# Patient Record
Sex: Male | Born: 1955 | Race: Black or African American | Hispanic: No | Marital: Married | Smoking: Former smoker
Health system: Southern US, Community
[De-identification: ages and names within clinical notes are randomized; demographics above are authoritative.]

## PROBLEM LIST (undated history)

## (undated) DIAGNOSIS — E7800 Pure hypercholesterolemia, unspecified: Secondary | ICD-10-CM

## (undated) DIAGNOSIS — B029 Zoster without complications: Secondary | ICD-10-CM

## (undated) DIAGNOSIS — Z95828 Presence of other vascular implants and grafts: Secondary | ICD-10-CM

## (undated) DIAGNOSIS — I779 Disorder of arteries and arterioles, unspecified: Secondary | ICD-10-CM

## (undated) DIAGNOSIS — R9431 Abnormal electrocardiogram [ECG] [EKG]: Secondary | ICD-10-CM

## (undated) DIAGNOSIS — E119 Type 2 diabetes mellitus without complications: Secondary | ICD-10-CM

## (undated) DIAGNOSIS — K219 Gastro-esophageal reflux disease without esophagitis: Secondary | ICD-10-CM

## (undated) DIAGNOSIS — M1990 Unspecified osteoarthritis, unspecified site: Secondary | ICD-10-CM

## (undated) DIAGNOSIS — I739 Peripheral vascular disease, unspecified: Secondary | ICD-10-CM

## (undated) DIAGNOSIS — R739 Hyperglycemia, unspecified: Secondary | ICD-10-CM

## (undated) DIAGNOSIS — I10 Essential (primary) hypertension: Secondary | ICD-10-CM

## (undated) HISTORY — DX: Abnormal electrocardiogram (ECG) (EKG): R94.31

---

## 2002-04-13 HISTORY — PX: INGUINAL HERNIA REPAIR: SUR1180

## 2002-10-19 ENCOUNTER — Ambulatory Visit (HOSPITAL_BASED_OUTPATIENT_CLINIC_OR_DEPARTMENT_OTHER): Admission: RE | Admit: 2002-10-19 | Discharge: 2002-10-19 | Payer: Self-pay | Admitting: General Surgery

## 2009-12-13 ENCOUNTER — Emergency Department (HOSPITAL_COMMUNITY): Admission: EM | Admit: 2009-12-13 | Discharge: 2009-12-14 | Payer: Self-pay | Admitting: Emergency Medicine

## 2009-12-17 ENCOUNTER — Emergency Department (HOSPITAL_COMMUNITY): Admission: EM | Admit: 2009-12-17 | Discharge: 2009-12-18 | Payer: Self-pay | Admitting: Emergency Medicine

## 2010-06-26 LAB — POCT CARDIAC MARKERS
Myoglobin, poc: 50.1 ng/mL (ref 12–200)
Myoglobin, poc: 66.8 ng/mL (ref 12–200)
Troponin i, poc: 0.08 ng/mL (ref 0.00–0.09)
Troponin i, poc: <0.05 ng/mL (ref 0.00–0.09)

## 2010-06-26 LAB — DIFFERENTIAL
Basophils Relative: 0 % (ref 0–1)
Basophils Relative: 0 % (ref 0–1)
Eosinophils Absolute: 0.1 10*3/uL (ref 0.0–0.7)
Eosinophils Absolute: 0.1 10*3/uL (ref 0.0–0.7)
Lymphs Abs: 2.3 10*3/uL (ref 0.7–4.0)
Monocytes Absolute: 0.4 10*3/uL (ref 0.1–1.0)
Monocytes Relative: 7 % (ref 3–12)
Neutro Abs: 2.5 10*3/uL (ref 1.7–7.7)
Neutrophils Relative %: 29 % — ABNORMAL LOW (ref 43–77)

## 2010-06-26 LAB — COMPREHENSIVE METABOLIC PANEL
ALT: 20 U/L (ref 0–53)
ALT: 23 U/L (ref 0–53)
Albumin: 4.1 g/dL (ref 3.5–5.2)
Alkaline Phosphatase: 55 U/L (ref 39–117)
Alkaline Phosphatase: 67 U/L (ref 39–117)
BUN: 10 mg/dL (ref 6–23)
CO2: 29 mEq/L (ref 19–32)
Calcium: 9.2 mg/dL (ref 8.4–10.5)
GFR calc Af Amer: >60 mL/min (ref >60)
GFR calc non Af Amer: >60 mL/min (ref >60)
Glucose, Bld: 121 mg/dL — ABNORMAL HIGH (ref 70–99)
Potassium: 3.5 mEq/L (ref 3.5–5.1)
Sodium: 133 mEq/L — ABNORMAL LOW (ref 135–145)
Sodium: 137 mEq/L (ref 135–145)
Total Protein: 7.2 g/dL (ref 6.0–8.3)

## 2010-06-26 LAB — CBC
HCT: 37.2 % — ABNORMAL LOW (ref 39.0–52.0)
Hemoglobin: 12.9 g/dL — ABNORMAL LOW (ref 13.0–17.0)
MCH: 30.9 pg (ref 26.0–34.0)
MCHC: 34.7 g/dL (ref 30.0–36.0)
MCV: 89.2 fL (ref 78.0–100.0)
Platelets: 173 10*3/uL (ref 150–400)
RDW: 11.8 % (ref 11.5–15.5)
WBC: 5.1 10*3/uL (ref 4.0–10.5)

## 2010-06-26 LAB — LIPASE, BLOOD: Lipase: 42 U/L (ref 11–59)

## 2010-06-26 LAB — PROTIME-INR: Prothrombin Time: 13.4 seconds (ref 11.6–15.2)

## 2010-08-29 NOTE — Op Note (Signed)
NAME:  RAYEN, DAFOE                          ACCOUNT NO.:  [id number]   MEDICAL RECORD NO.:  [id number]                   PATIENT TYPE:  AMB   LOCATION:  DSC                                  FACILITY:  [facility]   PHYSICIAN:  Ollen Gross. Vernell Morgans, M.D.              DATE OF BIRTH:  11/03/1955   DATE OF PROCEDURE:  10/19/2002  DATE OF DISCHARGE:  10/19/2002                                 OPERATIVE REPORT   PREOPERATIVE DIAGNOSES:  Right inguinal hernia.   POSTOPERATIVE DIAGNOSES:  Right direct inguinal hernia.   OPERATION PERFORMED:  Right direct inguinal hernia repair with mesh.   SURGEON:  Ollen Gross. Carolynne Edouard, M.D.   ANESTHESIA:  General endotracheal.   DESCRIPTION OF PROCEDURE:  After informed consent was obtained, the patient  was brought to the operating room and placed in supine position on the  operating table.  After adequate induction of general anesthesia, the  patient's right groin and abdomen were prepped with Betadine and draped in  the usual sterile manner.  Next, a small incision was made from the edge of  the pubic tubercle on the right towards the anterior iliac spine for a  distance of about 5 cm.  This incision was carried down through the skin and  subcutaneous tissue sharply with the electrocautery.  A small bridging vein  was encountered.  It was clamped with hemostats, divided and ligated with 3-  0 silk ties.  Dissection was then carried down through the subcutaneous  tissue until the fascia of the external oblique was encountered.  The  external oblique was opened along its fibers sharply with a 15 blade knife  and Metzenbaum scissors to the apex of the external ring.  blunt dissection  was then carried out beneath this fascial layer.  The cord structures were  identified and were surrounded bluntly between two fingers.  Next, a half  inch Penrose drain was placed around the cord structures for retraction.  The cord structures were carefully and bluntly skeletonized.   There was no  hernia sac identified with the cord structures.  There was an obvious  weakness of the floor of the inguinal canal consistent with a direct hernia.  The floor was repaired using interrupted  2-0 Vicryl stitches.  Next, a 3 x  6 piece of Atrium mesh was cut to fit and laterally, the mesh was cut  longitudinally, so that the tails could be wrapped around the cord.  The  mesh was sewed inferiorly to the shelving edge of the inguinal ligament  using a running 2-0 Prolene stitch.  The mesh was sewed superiorly to the  musculoaponeurotic strength layer of the transversalis using interrupted 2-0  Prolene vertical mattress stitches.  The tails were wrapped around the cord  and anchored laterally to the shelving edge of the inguinal ligament with  interrupted 2-0 Prolene stitch.  The repair was in good  position with no  tension.  The wound was then irrigated with saline.  The external oblique  was reapproximated with a 3-0 Vicryl running stitch.  The subcutaneous  tissue was infiltrated with 0.25% Marcaine with epinephrine.  The  subcutaneous fascia was closed with interrupted 3-0 Vicryl stitches and the  skin incision was closed with a 4-0 Monocryl running  subcuticular stitch.  Benzoin and Steri-Strips were applied.  The patient  tolerated the procedure well.  At the end of the case all sponge, needle and  instrument counts were correct.  The patient was awakened and taken to the  recovery room in stable condition.                                                  Ollen Gross. Vernell Morgans, M.D.    PST/MEDQ  D:  10/30/2002  T:  10/30/2002  Job:  [id number]

## 2013-11-25 ENCOUNTER — Ambulatory Visit (INDEPENDENT_AMBULATORY_CARE_PROVIDER_SITE_OTHER): Payer: BC Managed Care – PPO | Admitting: Emergency Medicine

## 2013-11-25 VITALS — BP 166/92 | HR 88 | Temp 98.2°F | Resp 16 | Ht 71.0 in | Wt 185.2 lb

## 2013-11-25 DIAGNOSIS — L02511 Cutaneous abscess of right hand: Secondary | ICD-10-CM

## 2013-11-25 DIAGNOSIS — IMO0002 Reserved for concepts with insufficient information to code with codable children: Secondary | ICD-10-CM

## 2013-11-25 DIAGNOSIS — M722 Plantar fascial fibromatosis: Secondary | ICD-10-CM

## 2013-11-25 DIAGNOSIS — B353 Tinea pedis: Secondary | ICD-10-CM

## 2013-11-25 MED ORDER — NAPROXEN SODIUM 550 MG PO TABS: 550.0000 mg | ORAL_TABLET | Freq: Two times a day (BID) | ORAL | Status: DC

## 2013-11-25 MED ORDER — SULFAMETHOXAZOLE-TMP DS 800-160 MG PO TABS: 1.0000 | ORAL_TABLET | Freq: Two times a day (BID) | ORAL | Status: DC

## 2013-11-25 NOTE — Patient Instructions (Signed)
Plantar Fasciitis  Plantar fasciitis is a common condition that causes foot pain. It is soreness (inflammation) of the band of tough fibrous tissue on the bottom of the foot that runs from the heel bone (calcaneus) to the ball of the foot. The cause of this soreness may be from excessive standing, poor fitting shoes, running on hard surfaces, being overweight, having an abnormal walk, or overuse (this is common in runners) of the painful foot or feet. It is also common in aerobic exercise dancers and ballet dancers.  SYMPTOMS   Most people with plantar fasciitis complain of:   Severe pain in the morning on the bottom of their foot especially when taking the first steps out of bed. This pain recedes after a few minutes of walking.   Severe pain is experienced also during walking following a long period of inactivity.   Pain is worse when walking barefoot or up stairs  DIAGNOSIS    Your caregiver will diagnose this condition by examining and feeling your foot.   Special tests such as X-rays of your foot, are usually not needed.  PREVENTION    Consult a sports medicine professional before beginning a new exercise program.   Walking programs offer a good workout. With walking there is a lower chance of overuse injuries common to runners. There is less impact and less jarring of the joints.   Begin all new exercise programs slowly. If problems or pain develop, decrease the amount of time or distance until you are at a comfortable level.   Wear good shoes and replace them regularly.   Stretch your foot and the heel cords at the back of the ankle (Achilles tendon) both before and after exercise.   Run or exercise on even surfaces that are not hard. For example, asphalt is better than pavement.   Do not run barefoot on hard surfaces.   If using a treadmill, vary the incline.   Do not continue to workout if you have foot or joint problems. Seek professional help if they do not improve.  HOME CARE INSTRUCTIONS     Avoid activities that cause you pain until you recover.   Use ice or cold packs on the problem or painful areas after working out.   Only take over-the-counter or prescription medicines for pain, discomfort, or fever as directed by your caregiver.   Soft shoe inserts or athletic shoes with air or gel sole cushions may be helpful.   If problems continue or become more severe, consult a sports medicine caregiver or your own health care provider. Cortisone is a potent anti-inflammatory medication that may be injected into the painful area. You can discuss this treatment with your caregiver.  MAKE SURE YOU:    Understand these instructions.   Will watch your condition.   Will get help right away if you are not doing well or get worse.  Document Released: 12/23/2000 Document Revised: 06/22/2011 Document Reviewed: 02/22/2008  ExitCare Patient Information 2015 [employer]. This information is not intended to replace advice given to you by your health care provider. Make sure you discuss any questions you have with your health care provider.

## 2013-11-25 NOTE — Progress Notes (Signed)
[facility] [address], [place] [id number] [phone]  Date:  11/25/2013   Name:  Anthony Brock   DOB:  08/20/1955   MRN:  [id number]  PCP:  No PCP Per Patient    Chief Complaint: Hand Pain and Callouses   History of Present Illness:  Anthony Brock is a 58 y.o. very pleasant male patient who presents with the following:  Has pain in right second finger.  Thinks he has a metal splinter in from work.  Painful and swelling.   No known FB Has pain in heels after standing in am.  Pain free at night. No history of in jury or overuse.  No swelling or bruising. Has athletes foot. No improvement with over the counter medications or other home remedies.  Denies other complaint or health concern today.   There are no active problems to display for this patient.   History reviewed. No pertinent past medical history.  Past Surgical History  Procedure Laterality Date  . Hernia repair      History  Substance Use Topics  . Smoking status: Current Every Day Smoker -- 10.00 packs/day    Types: Cigarettes  . Smokeless tobacco: Not on file  . Alcohol Use: No    Family History  Problem Relation Age of Onset  . Alzheimer's disease Mother   . Emphysema Father   . Alzheimer's disease Sister     No Known Allergies  Medication list has been reviewed and updated.  No current outpatient prescriptions on file prior to visit.   No current facility-administered medications on file prior to visit.    Review of Systems:  As per HPI, otherwise negative.    Physical Examination: Filed Vitals:   11/25/13 1423  BP: 166/92  Pulse: 88  Temp: 98.2 F (36.8 C)  Resp: 16   Filed Vitals:   11/25/13 1423  Height: 5\' 11"  (1.803 m)  Weight: 185 lb 3.2 oz (84.006 kg)   Body mass index is 25.84 kg/(m^2). Ideal Body Weight: Weight in (lb) to have BMI = 25: 178.9   GEN: WDWN, NAD, Non-toxic, Alert & Oriented x 3 HEENT: Atraumatic, Normocephalic.  Ears and  Nose: No external deformity. EXTR: No clubbing/cyanosis/edema NEURO: Normal gait.  PSYCH: Normally interactive. Conversant. Not depressed or anxious appearing.  Calm demeanor.  Tinea pedis both feet Tender anterior to heels.   Right hand second finger felon  Assessment and Plan: Felon Tinea pedis Plantar fasciitis Septra Anaprox  Signed,  Phillips OdorJeffery Kiylah Loyer, MD

## 2013-12-02 ENCOUNTER — Ambulatory Visit (INDEPENDENT_AMBULATORY_CARE_PROVIDER_SITE_OTHER): Payer: BC Managed Care – PPO

## 2013-12-02 ENCOUNTER — Ambulatory Visit (INDEPENDENT_AMBULATORY_CARE_PROVIDER_SITE_OTHER): Payer: BC Managed Care – PPO | Admitting: Family Medicine

## 2013-12-02 ENCOUNTER — Encounter (HOSPITAL_COMMUNITY): Payer: Self-pay | Admitting: Emergency Medicine

## 2013-12-02 ENCOUNTER — Emergency Department (HOSPITAL_COMMUNITY)
Admission: EM | Admit: 2013-12-02 | Discharge: 2013-12-02 | Disposition: A | Discharge status: Home / Self Care | Payer: BC Managed Care – PPO | Attending: Emergency Medicine | Admitting: Emergency Medicine

## 2013-12-02 VITALS — BP 138/86 | HR 50 | Temp 97.5°F | Resp 17 | Ht 71.0 in | Wt 184.0 lb

## 2013-12-02 DIAGNOSIS — F17200 Nicotine dependence, unspecified, uncomplicated: Secondary | ICD-10-CM | POA: Insufficient documentation

## 2013-12-02 DIAGNOSIS — IMO0002 Reserved for concepts with insufficient information to code with codable children: Secondary | ICD-10-CM

## 2013-12-02 DIAGNOSIS — N529 Male erectile dysfunction, unspecified: Secondary | ICD-10-CM

## 2013-12-02 DIAGNOSIS — L02511 Cutaneous abscess of right hand: Secondary | ICD-10-CM

## 2013-12-02 DIAGNOSIS — Z792 Long term (current) use of antibiotics: Secondary | ICD-10-CM | POA: Insufficient documentation

## 2013-12-02 DIAGNOSIS — M79609 Pain in unspecified limb: Secondary | ICD-10-CM | POA: Insufficient documentation

## 2013-12-02 DIAGNOSIS — Z791 Long term (current) use of non-steroidal anti-inflammatories (NSAID): Secondary | ICD-10-CM | POA: Diagnosis not present

## 2013-12-02 MED ORDER — HYDROCODONE-ACETAMINOPHEN 5-325 MG PO TABS: 1.0000 | ORAL_TABLET | ORAL | Status: DC | PRN

## 2013-12-02 MED ORDER — CEPHALEXIN 500 MG PO CAPS: 500.0000 mg | ORAL_CAPSULE | Freq: Four times a day (QID) | ORAL | Status: DC

## 2013-12-02 MED ORDER — SILDENAFIL CITRATE 100 MG PO TABS: 50.0000 mg | ORAL_TABLET | Freq: Every day | ORAL | Status: DC | PRN

## 2013-12-02 NOTE — ED Provider Notes (Signed)
CSN: [id number]     Arrival date & time 12/02/13  1235 History   First MD Initiated Contact with Patient 12/02/13 1310     Chief Complaint  Patient presents with  . Finger Injury     (Consider location/radiation/quality/duration/timing/severity/associated sxs/prior Treatment) HPI Comments: Patient with a history of Right index finger felon. [facility] Failed OP abx. Sent to ED for procedure by Dr. Izora Ribasoley. No rf such as diabetes or other immunocompromise. R hand dominant Denies fevers, chills, myalgias, arthralgias. Denies DOE, SOB, chest tightness or pressure, radiation to left arm, jaw or back, or diaphoresis. Denies dysuria, flank pain, suprapubic pain, frequency, urgency, or hematuria. Denies headaches, light headedness, weakness, visual disturbances. Denies abdominal pain, nausea, vomiting, diarrhea or constipation.     The history is provided by the patient and medical records. No language interpreter was used.    History reviewed. No pertinent past medical history. Past Surgical History  Procedure Laterality Date  . Hernia repair     Family History  Problem Relation Age of Onset  . Alzheimer's disease Mother   . Emphysema Father   . Alzheimer's disease Sister    History  Substance Use Topics  . Smoking status: Current Every Day Smoker -- 0.75 packs/day for 39 years    Types: Cigarettes  . Smokeless tobacco: Not on file  . Alcohol Use: No    Review of Systems  All other systems reviewed and are negative.     Allergies  Review of patient's allergies indicates no known allergies.  Home Medications   Prior to Admission medications   Medication Sig Start Date End Date Taking? Authorizing Provider  naproxen sodium (ANAPROX DS) 550 MG tablet Take 1 tablet (550 mg total) by mouth 2 (two) times daily with a meal. 11/25/13 11/25/14 Yes Phillips OdorJeffery Anderson, MD  OVER THE COUNTER MEDICATION Apply 1 application topically 2 (two) times daily. "OTC  Athletes foot cream"   Yes Historical Provider, MD  sulfamethoxazole-trimethoprim (BACTRIM DS) 800-160 MG per tablet Take 1 tablet by mouth 2 (two) times daily. 11/25/13  Yes Phillips OdorJeffery Anderson, MD   BP 168/79  Pulse 52  Resp 16  Ht 6' (1.829 m)  Wt 185 lb 8 oz (84.142 kg)  BMI 25.15 kg/m2  SpO2 98% Physical Exam  Nursing note and vitals reviewed. Constitutional: He is oriented to person, place, and time. He appears well-developed and well-nourished. No distress.  HENT:  Head: Normocephalic and atraumatic.  Eyes: Conjunctivae are normal. No scleral icterus.  Neck: Normal range of motion. Neck supple.  Cardiovascular: Normal rate, regular rhythm and normal heart sounds.   Pulmonary/Chest: Effort normal and breath sounds normal. No respiratory distress.  Abdominal: Soft. There is no tenderness.  Musculoskeletal: He exhibits no edema.  Neurological: He is alert and oriented to person, place, and time.  Skin: Skin is warm and dry. He is not diaphoretic.  Distal R index finger swollen with noted purulence in the finger pad.  Psychiatric: His behavior is normal.    ED Course  Procedures (including critical care time) Labs Review Labs Reviewed - No data to display  Imaging Review Dg Finger Index Right  12/02/2013   CLINICAL DATA:  Pain and swelling in the index finger.  EXAM: RIGHT INDEX FINGER 2+V  COMPARISON:  None.  FINDINGS: The mineralization and alignment are normal. There is no evidence of acute fracture or dislocation. Mild interphalangeal degenerative changes are present. There is mild soft tissue swelling distally.  There is no evidence of foreign body or bone destruction.  IMPRESSION: Mild soft tissue swelling and degenerative changes. No acute osseous findings or evidence of foreign body.   Electronically Signed   By: Roxy Horseman M.D.   On: 12/02/2013 12:47     EKG Interpretation None      MDM   Final diagnoses:  Felon, right    patietn seen in the ED by Dr. Izora Ribas  with in house I&D (please see his note) D/c with keflex and pain meds.  F/u with Dr Izora Ribas  The patient appears reasonably screened and/or stabilized for discharge and I doubt any other medical condition or other [facility] requiring further screening, evaluation, or treatment in the ED at this time prior to discharge.     Arthor Captain, PA-C 12/05/13 [phone]

## 2013-12-02 NOTE — ED Notes (Signed)
Pt here for finger injury, right pointer finger, sent [facility] to see dr Izora Ribasoley

## 2013-12-02 NOTE — Consult Note (Signed)
Reason for Consult:infected RIF Referring Physician: ER  CC:My finger is infected HPI:  Anthony Brock is an 58 y.o. right handed male who presents with   Felon of RIF, pt thought he got a piece of metal in finger a couple of days age, was seen at urgent care placed on abx, since then finger has become worse, with more swelling, pain.     .   Pain is rated at   7 /10 and is described as sharp/dull.  Pain is constant.  Pain is made better by rest/immobilization, worse with motion.   Associated signs/symptoms: pain of finger, decreased motion Previous treatment:  abx by outside urgent care   History reviewed. No pertinent past medical history.  Past Surgical History  Procedure Laterality Date  . Hernia repair      Family History  Problem Relation Age of Onset  . Alzheimer's disease Mother   . Emphysema Father   . Alzheimer's disease Sister     Social History:  reports that he has been smoking Cigarettes.  He has a 29.25 pack-year smoking history. He does not have any smokeless tobacco history on file. He reports that he does not drink alcohol or use illicit drugs.  Allergies: No Known Allergies  Medications: I have reviewed the patient's current medications.  No results found for this or any previous visit (from the past 48 hour(s)).  Dg Finger Index Right  12/02/2013   CLINICAL DATA:  Pain and swelling in the index finger.  EXAM: RIGHT INDEX FINGER 2+V  COMPARISON:  None.  FINDINGS: The mineralization and alignment are normal. There is no evidence of acute fracture or dislocation. Mild interphalangeal degenerative changes are present. There is mild soft tissue swelling distally. There is no evidence of foreign body or bone destruction.  IMPRESSION: Mild soft tissue swelling and degenerative changes. No acute osseous findings or evidence of foreign body.   Electronically Signed   By: Roxy HorsemanBill  Veazey M.D.   On: 12/02/2013 12:47    Pertinent items are noted in HPI. Temp:  [97.5 F (36.4  C)] 97.5 F (36.4 C) (08/22 1124) Pulse Rate:  [50-52] 52 (08/22 1301) Resp:  [16-17] 16 (08/22 1301) BP: (138-168)/(79-86) 168/79 mmHg (08/22 1301) SpO2:  [98 %-99 %] 98 % (08/22 1301) Weight:  [83.462 kg (184 lb)-84.142 kg (185 lb 8 oz)] 84.142 kg (185 lb 8 oz) (08/22 1301) General appearance: alert and cooperative Resp: clear to auscultation bilaterally Cardio: regular rate and rhythm GI: soft, non-tender; bowel sounds normal; no masses,  no organomegaly Extremities: extremities normal, atraumatic, no cyanosis or edema and except for RIF with evidence of fluctuance from base of nail to radial side of nail and pulp - fleon, no evidence of tenosynovitis   Assessment: Felon RIF Plan:  will i&d, pack with gauze, dressing changes explained to patient; pt currently on abx - encouraged to continue and finish, f/u as needed.  Pt should keep finger clean, covered . I have discussed this treatment plan in detail with patient, including the risks of the recommended treatment or surgery, the benefits and the alternatives.  The patient understands that additional treatment may be necessary.  Anthony Brock CHRISTOPHER 12/02/2013, 1:56 PM

## 2013-12-02 NOTE — Progress Notes (Signed)
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  It remains swollen and tender despite Septra antibiotic for one week.  He also has a metal foreign body at proximal phalanx of same finger  Objective:  NAD  Tense swelling right index finger distal phalanx.  Very tender with erythema radial side.  Foreign body right index finger proximal phalanx:  Removed with forceps and number 15 BP blade  [facility] reading (PRIMARY) by  Dr. Milus GlazierLauenstein:  No foreign body seen in right distal phalanx, STS only  Assessment:  Patient has what may be a paronychia this developing into a felon. I spoke with Dr. Izora Ribasoley on the phone and he will see the patient and emergency room warnings notified on arrival.  Patient also has some problems with the ED and a Viagra prescription was sent in for him.  Signed, Elvina SidleKurt Tynika Luddy.

## 2013-12-02 NOTE — Patient Instructions (Signed)
[facility] go [facility]. Dr. Izora [facility]asoley with [facility] is expecting you.

## 2013-12-02 NOTE — ED Notes (Signed)
Dr. Coley at bedside. 

## 2013-12-02 NOTE — ED Notes (Signed)
Harris, PA at bedside.  

## 2013-12-02 NOTE — ED Notes (Signed)
Suture cart at bedside 

## 2013-12-02 NOTE — Discharge Instructions (Signed)
Please follow the directions given to you by Dr. Izora Ribasoley Please take all of your antibiotics until finished!   You may develop abdominal discomfort or diarrhea from the antibiotic.  You may help offset this with probiotics which you can buy or get in yogurt. Do not eat  or take the probiotics until 2 hours after your antibiotic.   Do not drive, operate heavy machinery, drink alcohol, or take other tylenol containing products with norco medicine.  Fingertip Infection When an infection is around the nail, it is called a paronychia. When it appears over the tip of the finger, it is called a felon. These infections are due to minor injuries or cracks in the skin. If they are not treated properly, they can lead to bone infection and permanent damage to the fingernail. Incision and drainage is necessary if a pus pocket (an abscess) has formed. Antibiotics and pain medicine may also be needed. Keep your hand elevated for the next 2-3 days to reduce swelling and pain. If a pack was placed in the abscess, it should be removed in 1-2 days by your caregiver. Soak the finger in warm water for 20 minutes 4 times daily to help promote drainage. Keep the hands as dry as possible. Wear protective gloves with cotton liners. See your caregiver for follow-up care as recommended.  HOME CARE INSTRUCTIONS   Keep wound clean, dry and dressed as suggested by your caregiver.  Soak in warm salt water for fifteen minutes, four times per day for bacterial infections.  Your caregiver will prescribe an antibiotic if a bacterial infection is suspected. Take antibiotics as directed and finish the prescription, even if the problem appears to be improving before the medicine is gone.  Only take over-the-counter or prescription medicines for pain, discomfort, or fever as directed by your caregiver. SEEK IMMEDIATE MEDICAL CARE IF:  There is redness, swelling, or increasing pain in the wound.  Pus or any other unusual drainage is  coming from the wound.  An unexplained oral temperature above 102 F (38.9 C) develops.  You notice a foul smell coming from the wound or dressing. MAKE SURE YOU:   Understand these instructions.  Monitor your condition.  Contact your caregiver if you are getting worse or not improving. Document Released: 05/07/2004 Document Revised: 06/22/2011 Document Reviewed: 05/03/2008 [facility] Patient Information 2015 [place] [place]. This information is not intended to replace advice given to you by your health care provider. Make sure you discuss any questions you have with your health care provider.  Incision and Drainage Incision and drainage is a procedure in which a sac-like structure (cystic structure) is opened and drained. The area to be drained usually contains material such as pus, fluid, or blood.  LET YOUR CAREGIVER KNOW ABOUT:   Allergies to medicine.  Medicines taken, including vitamins, herbs, eyedrops, over-the-counter medicines, and creams.  Use of steroids (by mouth or creams).  Previous problems with anesthetics or numbing medicines.  History of bleeding problems or blood clots.  Previous surgery.  Other health problems, including diabetes and kidney problems.  Possibility of pregnancy, if this applies. RISKS AND COMPLICATIONS  Pain.  Bleeding.  Scarring.  Infection. BEFORE THE PROCEDURE  You may need to have an ultrasound or other imaging tests to see how large or deep your cystic structure is. Blood tests may also be used to determine if you have an infection or how severe the infection is. You may need to have a tetanus shot. PROCEDURE  The affected area  is cleaned with a cleaning fluid. The cyst area will then be numbed with a medicine (local anesthetic). A small incision will be made in the cystic structure. A syringe or catheter may be used to drain the contents of the cystic structure, or the contents may be squeezed out. The area will then be flushed  with a cleansing solution. After cleansing the area, it is often gently packed with a gauze or another wound dressing. Once it is packed, it will be covered with gauze and tape or some other type of wound dressing. AFTER THE PROCEDURE   Often, you will be allowed to go home right after the procedure.  You may be given antibiotic medicine to prevent or heal an infection.  If the area was packed with gauze or some other wound dressing, you will likely need to come back in 1 to 2 days to get it removed.  The area should heal in about 14 days. Document Released: 09/23/2000 Document Revised: 09/29/2011 Document Reviewed: 05/25/2011 [facility] Patient Information 2015 [place], [place]. This information is not intended to replace advice given to you by your health care provider. Make sure you discuss any questions you have with your health care provider.

## 2013-12-03 ENCOUNTER — Emergency Department (HOSPITAL_COMMUNITY)
Admission: EM | Admit: 2013-12-03 | Discharge: 2013-12-03 | Disposition: A | Discharge status: Home / Self Care | Payer: BC Managed Care – PPO | Attending: Emergency Medicine | Admitting: Emergency Medicine

## 2013-12-03 ENCOUNTER — Encounter (HOSPITAL_COMMUNITY): Payer: Self-pay | Admitting: Emergency Medicine

## 2013-12-03 DIAGNOSIS — Z4801 Encounter for change or removal of surgical wound dressing: Secondary | ICD-10-CM | POA: Diagnosis not present

## 2013-12-03 DIAGNOSIS — Z5189 Encounter for other specified aftercare: Secondary | ICD-10-CM

## 2013-12-03 DIAGNOSIS — Z792 Long term (current) use of antibiotics: Secondary | ICD-10-CM | POA: Insufficient documentation

## 2013-12-03 DIAGNOSIS — F17200 Nicotine dependence, unspecified, uncomplicated: Secondary | ICD-10-CM | POA: Insufficient documentation

## 2013-12-03 MED ORDER — OXYCODONE-ACETAMINOPHEN 5-325 MG PO TABS
1.0000 | ORAL_TABLET | Freq: Once | ORAL | Status: AC
  Administered 2013-12-03: 1 via ORAL
  Filled 2013-12-03: qty 1

## 2013-12-03 NOTE — ED Provider Notes (Signed)
CSN: [id number]     Arrival date & time 12/03/13  1512 History  This chart was scribed for non-physician practitioner Dalia Heading, PA-C, working with Elwin Mocha, MD, by Yevette Edwards, ED Scribe. This patient was seen in room TR10C/TR10C and the patient's care was started at 4:36 PM.   First MD Initiated Contact with Patient 12/03/13 1609     Chief Complaint  Patient presents with  . Follow-up    The history is provided by the patient. No language interpreter was used.   HPI Comments: KUTLER VANVRANKEN is a 58 y.o. male who presents to the Emergency Department requesting follow-up to a felon of his right index finger. The pt reports Dr. Izora Ribas performed a procedure to the finger yesterday and packed it with guaze. Mr. Flink would like the guaze removed from his finger as he was afraid to do it at home as instructed.  He endorses pain to the finger, and he last addressed the pain with hydrocodone seven hours ago. He states he has been taking the prescribed antibiotic as directed.  History reviewed. No pertinent past medical history. Past Surgical History  Procedure Laterality Date  . Hernia repair     Family History  Problem Relation Age of Onset  . Alzheimer's disease Mother   . Emphysema Father   . Alzheimer's disease Sister    History  Substance Use Topics  . Smoking status: Current Every Day Smoker -- 0.75 packs/day for 39 years    Types: Cigarettes  . Smokeless tobacco: Not on file  . Alcohol Use: No    Review of Systems  Constitutional: Negative for fever, diaphoresis, appetite change, fatigue and unexpected weight change.  HENT: Negative for mouth sores.   Eyes: Negative for visual disturbance.  Respiratory: Negative for cough, chest tightness, shortness of breath and wheezing.   Cardiovascular: Negative for chest pain.  Gastrointestinal: Negative for nausea, vomiting, abdominal pain, diarrhea and constipation.  Endocrine: Negative for polydipsia, polyphagia and  polyuria.  Genitourinary: Negative for dysuria, urgency, frequency and hematuria.  Musculoskeletal: Negative for back pain and neck stiffness.  Skin: Negative for rash.  Allergic/Immunologic: Negative for immunocompromised state.  Neurological: Negative for syncope, light-headedness and headaches.  Hematological: Does not bruise/bleed easily.  Psychiatric/Behavioral: Negative for sleep disturbance. The patient is not nervous/anxious.     Allergies  Review of patient's allergies indicates no known allergies.  Home Medications   Prior to Admission medications   Medication Sig Start Date End Date Taking? Authorizing Provider  cephALEXin (KEFLEX) 500 MG capsule Take 1 capsule (500 mg total) by mouth 4 (four) times daily. 12/02/13  Yes Arthor Captain, PA-C  HYDROcodone-acetaminophen (NORCO) 5-325 MG per tablet Take 1 tablet by mouth every 4 (four) hours as needed. 12/02/13  Yes Arthor Captain, PA-C  naproxen sodium (ANAPROX DS) 550 MG tablet Take 1 tablet (550 mg total) by mouth 2 (two) times daily with a meal. 11/25/13 11/25/14 Yes Phillips Odor, MD  OVER THE COUNTER MEDICATION Apply 1 application topically 2 (two) times daily. "OTC Athletes foot cream"   Yes Historical Provider, MD  sulfamethoxazole-trimethoprim (BACTRIM DS) 800-160 MG per tablet Take 1 tablet by mouth 2 (two) times daily. 11/25/13  Yes Phillips Odor, MD   Triage Vitals: BP 169/81  Pulse 70  Temp(Src) 98.5 F (36.9 C) (Oral)  Resp 16  SpO2 97% Physical Exam  Nursing note and vitals reviewed. Constitutional: He appears well-developed and well-nourished. No distress.  Awake, alert, nontoxic appearance  HENT:  Head: Normocephalic  and atraumatic.  Mouth/Throat: Oropharynx is clear and moist. No oropharyngeal exudate.  Eyes: Conjunctivae are normal. No scleral icterus.  Neck: Normal range of motion. Neck supple.  Cardiovascular: Normal rate, regular rhythm and intact distal pulses.   Pulmonary/Chest: Effort normal and  breath sounds normal. No respiratory distress. He has no wheezes.  Equal chest expansion  Abdominal: Soft. Bowel sounds are normal. He exhibits no mass. There is no tenderness. There is no rebound and no guarding.  Musculoskeletal: Normal range of motion. He exhibits no edema.  Neurological: He is alert.  Speech is clear and goal oriented Moves extremities without ataxia  Skin: Skin is warm and dry. He is not diaphoretic.  Right index finger: mild erythema and induration with packing in place. Packing removed without complication. Base of the wound inspected. No further purulent drainage expressed.   Psychiatric: He has a normal mood and affect.    ED Course  Procedures (including critical care time)  DIAGNOSTIC STUDIES: Oxygen Saturation is 97% on room air, normal by my interpretation.    COORDINATION OF CARE:  4:40 PM- Discussed treatment plan with patient, and the patient agreed to the plan. The plan includes removing the guaze. Will provide pt pain medication while in the ED. Encouraged the pt to perform warm soaks. Advised pt to continue course of antibiotics.   Labs Review Labs Reviewed - No data to display  Imaging Review Dg Finger Index Right  12/02/2013   CLINICAL DATA:  Pain and swelling in the index finger.  EXAM: RIGHT INDEX FINGER 2+V  COMPARISON:  None.  FINDINGS: The mineralization and alignment are normal. There is no evidence of acute fracture or dislocation. Mild interphalangeal degenerative changes are present. There is mild soft tissue swelling distally. There is no evidence of foreign body or bone destruction.  IMPRESSION: Mild soft tissue swelling and degenerative changes. No acute osseous findings or evidence of foreign body.   Electronically Signed   By: Roxy Horseman M.D.   On: 12/02/2013 12:47     EKG Interpretation None      MDM   Final diagnoses:  Wound check, abscess   DEZMOND DOWNIE presents for wound check and packing removal after I&D of a Felon  by Dr. Izora Ribas yesterday.  Pt reports adequate pain control with percocet and no systemic symptoms.  No evidence of spreading infection.  Packing removed without difficulty. Wound base visualized and wound washed. No purulent drainage expressed.  Care of the wound discussed with the patient at length. He is to followup with Dr. Izora Ribas as needed or as directed. He is to complete his antibiotic course completely. He is to return to the emergency department for systemic symptoms or spreading infection.  BP 192/85  Pulse 62  Temp(Src) 98.2 F (36.8 C) (Oral)  Resp 18  SpO2 98%   I personally performed the services described in this documentation, which was scribed in my presence. The recorded information has been reviewed and is accurate.    Dahlia Client Nazareth Norenberg, PA-C 12/03/13 1732

## 2013-12-03 NOTE — ED Notes (Signed)
Declined W/C at D/C and was escorted to lobby by RN. 

## 2013-12-03 NOTE — Discharge Instructions (Signed)
1. Medications: usual home medications 2. Treatment: rest, drink plenty of fluids, warm water soaks twice per day, keep bandage dry 3. Follow Up: Please followup with your primary doctor for discussion of your diagnoses and further evaluation after today's visit; if you do not have a primary care doctor use the resource guide provided to find one;

## 2013-12-03 NOTE — ED Notes (Signed)
He was here yesterday and seen by Dr Izora Ribas for finger infection. He states "dr Izora Ribas told me to take the packing out today but i just couldn't bring myself to do."

## 2013-12-07 NOTE — ED Provider Notes (Signed)
Medical screening examination/treatment/procedure(s) were performed by non-physician practitioner and as supervising physician I was immediately available for consultation/collaboration.   EKG Interpretation None        Elwin Mocha, MD 12/07/13 0004

## 2013-12-09 ENCOUNTER — Telehealth: Payer: Self-pay

## 2013-12-09 NOTE — Telephone Encounter (Signed)
Patient says there was supposed to be a prior auth submitted for his Viagra.   He says he was told this would happen 4 days ago. Patient also wants to know if he can pay for the RX out of pocket and he get reimbursed. I advised he is more than welcome to pay for the RX out of pocket, HOWEVER, he will NOT be reimbursed by [facility] and if the insurance does not authorize this RX they will also not reimburse him.

## 2013-12-10 NOTE — ED Provider Notes (Signed)
Medical screening examination/treatment/procedure(s) were performed by non-physician practitioner and as supervising physician I was immediately available for consultation/collaboration.   Shalva Rozycki L Evadene Wardrip, MD 12/10/13 1637 

## 2013-12-12 NOTE — Telephone Encounter (Signed)
Completed on covermymeds. Pending. Had to call pharm for ins info bc never received any info from them. [employer] ID [id number], [phone].

## 2013-12-15 NOTE — Telephone Encounter (Signed)
PA approved through 12/14/16. Notified pt and pharm.

## 2014-03-14 ENCOUNTER — Telehealth (HOSPITAL_COMMUNITY): Payer: Self-pay | Admitting: *Deleted

## 2014-03-16 ENCOUNTER — Other Ambulatory Visit (HOSPITAL_COMMUNITY): Payer: Self-pay | Admitting: Foot & Ankle Surgery

## 2014-03-16 DIAGNOSIS — I739 Peripheral vascular disease, unspecified: Secondary | ICD-10-CM

## 2014-03-20 ENCOUNTER — Ambulatory Visit (HOSPITAL_COMMUNITY)
Admission: RE | Admit: 2014-03-20 | Discharge: 2014-03-20 | Disposition: A | Discharge status: Home / Self Care | Payer: BC Managed Care – PPO | Source: Ambulatory Visit | Attending: Internal Medicine | Admitting: Internal Medicine

## 2014-03-20 DIAGNOSIS — I739 Peripheral vascular disease, unspecified: Secondary | ICD-10-CM | POA: Insufficient documentation

## 2014-03-20 NOTE — Progress Notes (Signed)
Lower Extremity Arterial Duplex Completed. °Brianna L Mazza,RVT °

## 2014-04-19 ENCOUNTER — Ambulatory Visit (INDEPENDENT_AMBULATORY_CARE_PROVIDER_SITE_OTHER): Payer: Self-pay | Admitting: Cardiovascular Disease

## 2014-04-19 ENCOUNTER — Encounter: Payer: Self-pay | Admitting: Cardiovascular Disease

## 2014-04-19 VITALS — BP 153/84 | HR 71 | Ht 73.0 in | Wt 194.8 lb

## 2014-04-19 DIAGNOSIS — R0989 Other specified symptoms and signs involving the circulatory and respiratory systems: Secondary | ICD-10-CM

## 2014-04-19 DIAGNOSIS — I739 Peripheral vascular disease, unspecified: Secondary | ICD-10-CM

## 2014-04-19 DIAGNOSIS — Z79899 Other long term (current) drug therapy: Secondary | ICD-10-CM

## 2014-04-19 DIAGNOSIS — R5383 Other fatigue: Secondary | ICD-10-CM

## 2014-04-19 DIAGNOSIS — Z720 Tobacco use: Secondary | ICD-10-CM

## 2014-04-19 DIAGNOSIS — D689 Coagulation defect, unspecified: Secondary | ICD-10-CM

## 2014-04-19 NOTE — Assessment & Plan Note (Signed)
The patient hasn't been experiencing left greater than right lobe; claudication which is postop limiting for several years. He was evaluated by [facility]. Fanny DanceJah at [facility] order Doppler studies performed in our office 03/20/14 revealing a right ABI 0.58 with an occluded distal right SFA and a left ABI of 0.41 the high-frequency signal in the distal left SFA. Angiography and potential intervention.

## 2014-04-19 NOTE — Patient Instructions (Addendum)
Dr. Allyson SabalBerry has ordered a peripheral angiogram to be done at [facility].  This procedure is going to look at the bloodflow in your lower extremities.  If Dr. Allyson SabalBerry is able to open up the arteries, you will have to spend one night in the hospital.  If he is not able to open the arteries, you will be able to go home that same day.    After the procedure, you will not be allowed to drive for 3 days or push, pull, or lift anything greater than 10 lbs for one week.    You will be required to have the following tests prior to the procedure:  1. Blood work-the blood work can be done no more than 7 days prior to the procedure.  It can be done at any [facility] lab.  There is one downstairs on the first floor of this building and one in the [facility] ([address])  2. Chest Xray-the chest xray order has already been placed at the [facility].     *REPS [facility]   Carotid Duplex- This test is an ultrasound of the carotid arteries in your neck. It looks at blood flow through these arteries that supply the brain with blood. Allow one hour for this exam. There are no restrictions or special instructions.

## 2014-04-19 NOTE — Progress Notes (Signed)
     04/19/2014 Anthony LeschLarry D Brock   08/01/1955  [id number]  Primary Physician No PCP Per Patient Primary Cardiologist: Runell GessJonathan J. Berry MD Roseanne RenoFACP,FACC,FAHA, FSCAI  HPI:  Anthony Brock is a 59 year old married African-American veteran who works at [employer]. He is referred by [facility] Fanny [facility]Jah at [facility] for peripheral vascular evaluation because of lifestyle limiting claudication. He has a long history of tobacco abuse smoking three-quarter pack a day for last 30 years. He has never had a heart attack or stroke, and denies chest pain or shortness of breath. He states that he has had left greater than right lower extremity claudication which is lifestyle limiting for several years. Recent Dopplers performed in our office 03/20/14 revealed a right ABI 0.58 with an occluded distal right SFA and a left ABI 0.41 with a high-frequency signal in his left SFA. I'm going to arrange for him to undergo angiography and potential percutaneous intervention.   No current outpatient prescriptions on file.   No current facility-administered medications for this visit.    No Known Allergies  History   Social History  . Marital Status: Married    Spouse Name: N/A    Number of Children: N/A  . Years of Education: N/A   Occupational History  . Not on file.   Social History Main Topics  . Smoking status: Current Every Day Smoker -- 0.75 packs/day for 39 years    Types: Cigarettes  . Smokeless tobacco: Not on file  . Alcohol Use: No  . Drug Use: No  . Sexual Activity: Yes   Other Topics Concern  . Not on file   Social History Narrative     Review of Systems: General: negative for chills, fever, night sweats or weight changes.  Cardiovascular: negative for chest pain, dyspnea on exertion, edema, orthopnea, palpitations, paroxysmal nocturnal dyspnea or shortness of breath Dermatological: negative for rash Respiratory: negative for cough or wheezing Urologic: negative for hematuria Abdominal:  negative for nausea, vomiting, diarrhea, bright red blood per rectum, melena, or hematemesis Neurologic: negative for visual changes, syncope, or dizziness All other systems reviewed and are otherwise negative except as noted above.    Blood pressure 153/84, pulse 71, height 6\' 1"  (1.854 m), weight 194 lb 12.8 oz (88.361 kg).  General appearance: alert and no distress Neck: no adenopathy, no JVD, supple, symmetrical, trachea midline, thyroid not enlarged, symmetric, no tenderness/mass/nodules and left carotid bruit Lungs: clear to auscultation bilaterally Heart: regular rate and rhythm, S1, S2 normal, no murmur, click, rub or gallop Extremities: extremities normal, atraumatic, no cyanosis or edema and absent pedal pulses, 2+ femorals without bruits  EKG not performed today  ASSESSMENT AND PLAN:   Claudication The patient hasn't been experiencing left greater than right lobe; claudication which is postop limiting for several years. He was evaluated by Dr. Fanny [facility]Jah at [facility] order Doppler studies performed in our office 03/20/14 revealing a right ABI 0.58 with an occluded distal right SFA and a left ABI of 0.41 the high-frequency signal in the distal left SFA. Angiography and potential intervention.      Runell GessJonathan J. Berry MD FACP,FACC,FAHA, [facility] 04/19/2014 4:02 PM

## 2014-04-24 ENCOUNTER — Ambulatory Visit
Admission: RE | Admit: 2014-04-24 | Discharge: 2014-04-24 | Disposition: A | Discharge status: Home / Self Care | Payer: BLUE CROSS/BLUE SHIELD | Source: Ambulatory Visit | Attending: Cardiovascular Disease | Admitting: Cardiovascular Disease

## 2014-04-24 DIAGNOSIS — Z720 Tobacco use: Secondary | ICD-10-CM

## 2014-04-25 ENCOUNTER — Other Ambulatory Visit: Payer: Self-pay | Admitting: *Deleted

## 2014-04-25 ENCOUNTER — Telehealth: Payer: Self-pay | Admitting: *Deleted

## 2014-04-25 ENCOUNTER — Ambulatory Visit (HOSPITAL_COMMUNITY)
Admission: RE | Admit: 2014-04-25 | Discharge: 2014-04-25 | Disposition: A | Discharge status: Home / Self Care | Payer: BLUE CROSS/BLUE SHIELD | Source: Ambulatory Visit | Attending: Internal Medicine | Admitting: Internal Medicine

## 2014-04-25 DIAGNOSIS — R0989 Other specified symptoms and signs involving the circulatory and respiratory systems: Secondary | ICD-10-CM | POA: Diagnosis present

## 2014-04-25 DIAGNOSIS — Z01812 Encounter for preprocedural laboratory examination: Secondary | ICD-10-CM

## 2014-04-25 LAB — BASIC METABOLIC PANEL
BUN: 11 mg/dL (ref 6–23)
CALCIUM: 9.5 mg/dL (ref 8.4–10.5)
CO2: 28 mEq/L (ref 19–32)
CREATININE: 1.12 mg/dL (ref 0.50–1.35)
Chloride: 104 mEq/L (ref 96–112)
GLUCOSE: 89 mg/dL (ref 70–99)
Potassium: 4.2 mEq/L (ref 3.5–5.3)
Sodium: 139 mEq/L (ref 135–145)

## 2014-04-25 LAB — APTT: aPTT: 26 seconds (ref 24–37)

## 2014-04-25 LAB — HEPATIC FUNCTION PANEL
ALK PHOS: 57 U/L (ref 39–117)
ALT: 18 U/L (ref 0–53)
AST: 21 U/L (ref 0–37)
Albumin: 4.4 g/dL (ref 3.5–5.2)
Bilirubin, Direct: 0.1 mg/dL (ref 0.0–0.3)
Indirect Bilirubin: 0.4 mg/dL (ref 0.2–1.2)
TOTAL PROTEIN: 7.6 g/dL (ref 6.0–8.3)
Total Bilirubin: 0.5 mg/dL (ref 0.2–1.2)

## 2014-04-25 LAB — TSH: TSH: 1.432 u[IU]/mL (ref 0.350–4.500)

## 2014-04-25 LAB — CBC
HEMATOCRIT: 39.8 % (ref 39.0–52.0)
HEMOGLOBIN: 14.2 g/dL (ref 13.0–17.0)
MCH: 30.4 pg (ref 26.0–34.0)
MCHC: 35.7 g/dL (ref 30.0–36.0)
MCV: 85.2 fL (ref 78.0–100.0)
MPV: 9.9 fL (ref 8.6–12.4)
Platelets: 199 10*3/uL (ref 150–400)
RBC: 4.67 MIL/uL (ref 4.22–5.81)
RDW: 13.3 % (ref 11.5–15.5)
WBC: 4.2 10*3/uL (ref 4.0–10.5)

## 2014-04-25 LAB — PROTIME-INR
INR: 1 (ref <1.50)
Prothrombin Time: 13.2 seconds (ref 11.6–15.2)

## 2014-04-25 LAB — LIPID PANEL
CHOL/HDL RATIO: 4.8 ratio
Cholesterol: 174 mg/dL (ref 0–200)
HDL: 36 mg/dL — ABNORMAL LOW (ref >39)
LDL Cholesterol: 82 mg/dL (ref 0–99)
TRIGLYCERIDES: 281 mg/dL — AB (ref <150)
VLDL: 56 mg/dL — AB (ref 0–40)

## 2014-04-25 NOTE — Telephone Encounter (Signed)
Returning a call from [place] concerning his test results.

## 2014-04-25 NOTE — Progress Notes (Signed)
Carotid Duplex Completed. Zendayah Hardgrave, BS, RDMS, RVT  

## 2014-04-25 NOTE — Telephone Encounter (Signed)
I spoke with patient and he is agreeable to proceed with the carotid angio at the time of the peripheral angio.

## 2014-04-25 NOTE — Progress Notes (Signed)
Voice order received from Dr Allyson SabalBerry that patient needed to add a carotid angio to the already scheduled lower extremity angiogram.  Orders were updated and cath lab made aware

## 2014-04-26 ENCOUNTER — Telehealth: Payer: Self-pay | Admitting: Cardiovascular Disease

## 2014-04-26 NOTE — Telephone Encounter (Signed)
I spoke with patient.  He has filled for Short term disability.  He was inquiring about whether or not [employer] has requested records.  I told him that I was unsure of that.  I have not received any information.  I will defer to medical records.    Also, he wanted to make sure that we include his recent diagnosis of carotid artery disease in his short term disability papers.  I verbalized that all records are included in the disability papers.

## 2014-04-26 NOTE — Telephone Encounter (Signed)
Called patient regarding [employer] form.  Explained needed signed Release to process.  He to visit office today and sign release to process.

## 2014-04-27 ENCOUNTER — Telehealth: Payer: Self-pay | Admitting: Cardiovascular Disease

## 2014-04-27 NOTE — Telephone Encounter (Signed)
1.14.16 Patient brought by disability form from [employer] to be completed, signed by Dr Allyson SabalBerry.  He completed release and took his form to [facility] [place].  Received documentatiion form, release and payment received at [facility] on 04/26/14. lp

## 2014-04-30 ENCOUNTER — Ambulatory Visit (HOSPITAL_COMMUNITY)
Admission: RE | Admit: 2014-04-30 | Discharge: 2014-05-01 | Disposition: A | Discharge status: Home / Self Care | Payer: BLUE CROSS/BLUE SHIELD | Source: Ambulatory Visit | Attending: Cardiovascular Disease | Admitting: Cardiovascular Disease

## 2014-04-30 ENCOUNTER — Encounter (HOSPITAL_COMMUNITY)
Admission: RE | Disposition: A | Discharge status: Home / Self Care | Payer: Self-pay | Source: Ambulatory Visit | Attending: Cardiovascular Disease

## 2014-04-30 ENCOUNTER — Encounter (HOSPITAL_COMMUNITY): Payer: Self-pay | Admitting: *Deleted

## 2014-04-30 DIAGNOSIS — R030 Elevated blood-pressure reading, without diagnosis of hypertension: Secondary | ICD-10-CM | POA: Insufficient documentation

## 2014-04-30 DIAGNOSIS — I7092 Chronic total occlusion of artery of the extremities: Secondary | ICD-10-CM | POA: Diagnosis not present

## 2014-04-30 DIAGNOSIS — Z720 Tobacco use: Secondary | ICD-10-CM | POA: Diagnosis present

## 2014-04-30 DIAGNOSIS — Z01812 Encounter for preprocedural laboratory examination: Secondary | ICD-10-CM

## 2014-04-30 DIAGNOSIS — F17210 Nicotine dependence, cigarettes, uncomplicated: Secondary | ICD-10-CM | POA: Diagnosis not present

## 2014-04-30 DIAGNOSIS — R739 Hyperglycemia, unspecified: Secondary | ICD-10-CM | POA: Diagnosis present

## 2014-04-30 DIAGNOSIS — I10 Essential (primary) hypertension: Secondary | ICD-10-CM | POA: Diagnosis present

## 2014-04-30 DIAGNOSIS — I70212 Atherosclerosis of native arteries of extremities with intermittent claudication, left leg: Secondary | ICD-10-CM

## 2014-04-30 DIAGNOSIS — I6522 Occlusion and stenosis of left carotid artery: Secondary | ICD-10-CM | POA: Diagnosis present

## 2014-04-30 DIAGNOSIS — I70213 Atherosclerosis of native arteries of extremities with intermittent claudication, bilateral legs: Secondary | ICD-10-CM | POA: Diagnosis not present

## 2014-04-30 DIAGNOSIS — I739 Peripheral vascular disease, unspecified: Secondary | ICD-10-CM | POA: Diagnosis present

## 2014-04-30 DIAGNOSIS — Z7982 Long term (current) use of aspirin: Secondary | ICD-10-CM | POA: Diagnosis not present

## 2014-04-30 HISTORY — PX: CAROTID ANGIOGRAM: SHX5504

## 2014-04-30 HISTORY — DX: Essential (primary) hypertension: I10

## 2014-04-30 HISTORY — PX: PERIPHERAL VASCULAR CATHETERIZATION: SHX172C

## 2014-04-30 HISTORY — DX: Disorder of arteries and arterioles, unspecified: I77.9

## 2014-04-30 HISTORY — DX: Peripheral vascular disease, unspecified: I73.9

## 2014-04-30 HISTORY — PX: LOWER EXTREMITY ANGIOGRAM: SHX5508

## 2014-04-30 HISTORY — DX: Hyperglycemia, unspecified: R73.9

## 2014-04-30 LAB — POCT ACTIVATED CLOTTING TIME
ACTIVATED CLOTTING TIME: 214 s
ACTIVATED CLOTTING TIME: 153 s
Activated Clotting Time: 214 seconds
Activated Clotting Time: 239 seconds
Activated Clotting Time: 0 seconds

## 2014-04-30 LAB — MRSA PCR SCREENING: MRSA BY PCR: NEGATIVE

## 2014-04-30 SURGERY — ANGIOGRAM, LOWER EXTREMITY
Anesthesia: LOCAL

## 2014-04-30 MED ORDER — ASPIRIN 81 MG PO CHEW
81.0000 mg | CHEWABLE_TABLET | ORAL | Status: AC
  Administered 2014-04-30: 81 mg via ORAL

## 2014-04-30 MED ORDER — MORPHINE SULFATE 2 MG/ML IJ SOLN
2.0000 mg | Freq: Once | INTRAMUSCULAR | Status: AC
  Administered 2014-04-30: 2 mg via INTRAVENOUS
  Filled 2014-04-30: qty 1

## 2014-04-30 MED ORDER — "THROMBI-PAD 3""X3"" EX PADS"
1.0000 | MEDICATED_PAD | Freq: Once | CUTANEOUS | Status: AC
  Administered 2014-04-30: 1 via TOPICAL
  Filled 2014-04-30: qty 1

## 2014-04-30 MED ORDER — ATROPINE SULFATE 0.1 MG/ML IJ SOLN
INTRAMUSCULAR | Status: AC
  Filled 2014-04-30: qty 10

## 2014-04-30 MED ORDER — HEPARIN (PORCINE) IN NACL 2-0.9 UNIT/ML-% IJ SOLN
INTRAMUSCULAR | Status: AC
  Filled 2014-04-30: qty 500

## 2014-04-30 MED ORDER — CLONIDINE HCL 0.1 MG PO TABS
0.1000 mg | ORAL_TABLET | Freq: Once | ORAL | Status: AC
  Administered 2014-04-30: 0.1 mg via ORAL
  Filled 2014-04-30: qty 1

## 2014-04-30 MED ORDER — HYDRALAZINE HCL 20 MG/ML IJ SOLN
10.0000 mg | INTRAMUSCULAR | Status: DC | PRN
  Administered 2014-04-30 (×2): 10 mg via INTRAVENOUS
  Filled 2014-04-30 (×3): qty 1

## 2014-04-30 MED ORDER — CLOPIDOGREL BISULFATE 75 MG PO TABS
75.0000 mg | ORAL_TABLET | Freq: Every day | ORAL | Status: DC
  Administered 2014-05-01: 75 mg via ORAL
  Filled 2014-04-30 (×3): qty 1

## 2014-04-30 MED ORDER — SODIUM CHLORIDE 0.9 % IV SOLN
INTRAVENOUS | Status: DC
  Administered 2014-04-30: 13:00:00 via INTRAVENOUS

## 2014-04-30 MED ORDER — HEPARIN SODIUM (PORCINE) 1000 UNIT/ML IJ SOLN
INTRAMUSCULAR | Status: AC
  Filled 2014-04-30: qty 1

## 2014-04-30 MED ORDER — CLOPIDOGREL BISULFATE 300 MG PO TABS
ORAL_TABLET | ORAL | Status: AC
  Filled 2014-04-30: qty 2

## 2014-04-30 MED ORDER — ASPIRIN 81 MG PO CHEW
CHEWABLE_TABLET | ORAL | Status: AC
  Filled 2014-04-30: qty 1

## 2014-04-30 MED ORDER — LIDOCAINE HCL (PF) 1 % IJ SOLN
INTRAMUSCULAR | Status: AC
  Filled 2014-04-30: qty 30

## 2014-04-30 MED ORDER — ALUM & MAG HYDROXIDE-SIMETH 200-200-20 MG/5ML PO SUSP
15.0000 mL | ORAL | Status: DC | PRN
  Administered 2014-04-30: 15 mL via ORAL
  Filled 2014-04-30: qty 30

## 2014-04-30 MED ORDER — ASPIRIN EC 325 MG PO TBEC
325.0000 mg | DELAYED_RELEASE_TABLET | Freq: Every day | ORAL | Status: DC
  Administered 2014-04-30 – 2014-05-01 (×2): 325 mg via ORAL
  Filled 2014-04-30 (×2): qty 1

## 2014-04-30 MED ORDER — HYDRALAZINE HCL 20 MG/ML IJ SOLN
10.0000 mg | Freq: Once | INTRAMUSCULAR | Status: AC
  Administered 2014-04-30: 10 mg via INTRAVENOUS

## 2014-04-30 MED ORDER — HEPARIN (PORCINE) IN NACL 2-0.9 UNIT/ML-% IJ SOLN
INTRAMUSCULAR | Status: AC
  Filled 2014-04-30: qty 1000

## 2014-04-30 MED ORDER — SODIUM CHLORIDE 0.9 % IV SOLN
INTRAVENOUS | Status: AC
  Administered 2014-04-30: 17:00:00 via INTRAVENOUS

## 2014-04-30 MED ORDER — ONDANSETRON HCL 4 MG/2ML IJ SOLN
4.0000 mg | Freq: Four times a day (QID) | INTRAMUSCULAR | Status: DC | PRN
  Administered 2014-04-30: 4 mg via INTRAVENOUS
  Filled 2014-04-30: qty 2

## 2014-04-30 MED ORDER — ACETAMINOPHEN 325 MG PO TABS
650.0000 mg | ORAL_TABLET | ORAL | Status: DC | PRN
Start: 2014-04-30 — End: 2014-05-01

## 2014-04-30 MED ORDER — SODIUM CHLORIDE 0.9 % IJ SOLN
3.0000 mL | INTRAMUSCULAR | Status: DC | PRN
  Administered 2014-04-30: 3 mL via INTRAVENOUS
  Filled 2014-04-30: qty 3

## 2014-04-30 NOTE — CV Procedure (Signed)
ABDI HUSAK is a 59 y.o. male    [id number] LOCATION:  FACILITY: [facility]  PHYSICIAN: Quay Burow, M.D. [birth year]   DATE OF PROCEDURE:  04/30/2014  DATE OF DISCHARGE:     PV Angiogram/Intervention    History obtained from chart review.Mr. Adduci is a 59 year old married African-American veteran who works at [employer]. He is referred by Dr. Melony Overly at [facility] for peripheral vascular evaluation because of lifestyle limiting claudication. He has a long history of tobacco abuse smoking three-quarter pack a day for last 30 years. He has never had a heart attack or stroke, and denies chest pain or shortness of breath. He states that he has had left greater than right lower extremity claudication which is lifestyle limiting for several years. Recent Dopplers performed in our office 03/20/14 revealed a right ABI 0.58 with an occluded distal right SFA and a left ABI 0.41 with a high-frequency signal in his left SFA. I'm going to arrange for him to undergo angiography and potential percutaneous intervention. In addition, he had a carotid Doppler suggested a high-grade left internal carotid artery stenosis. I am going to plan on performing carotid angiography during the same procedure.   PROCEDURE DESCRIPTION:   The patient was brought to the second floor  [facility] cath lab in the postabsorptive state. He was not  premedicated . His right groin was prepped and shaved in usual sterile fashion. Xylocaine 1% was used  for local anesthesia. A 5 French sheath was inserted into the right common femoral  artery using standard Seldinger technique. A 5 French pigtail catheter was used to perform aortic arch angiography, abdominal aortography, bifemoral runoff using bolus chase digital subtraction step table technique. A 5 French JB1 catheter was used to perform left carotid angiography. Visipaque dye was used for the entirety of the case. Retrograde aortic pressure was monitored during the  case.   HEMODYNAMICS:    AO SYSTOLIC/AO DIASTOLIC: 354/65   Angiographic Data:   1: Aortic arch)-type I aortic arch  2: Left carotid- 80 % proximal  left internal carotid artery stenosis not involving the bifurcation  3: Abdominal aortography-distal dominant aortogram revealed a widely patent aorta without aneurysm or obstruction.  4: Left lower extremity-short segment occlusion mid left SFA with 3 vessel runoff  5: Right lower extremity-short segment occlusion right SFA with 3 vessel runoff    IMPRESSION:Mr. Bonito has a 80% proximal left internal carotid artery stenosis. I am going to discuss with him the possibility of being enrolled in the [facility] 2  Protocol. In addition, he has short segment bilateral mid SFA occlusions with lifestyle limiting claudication. My plan is to perform [facility] 1 directional atherectomy plus drug eluding balloon angioplasty.  Procedure Description:contralateral access was obtained with a crossover catheter and a 7 [place] multipurpose destination sheath. The patient received a total of 12,500 units of heparin with an ACT of 239. Total contrast administered to the patient was 277 mL. I was able to cross the CTO with a Viance CTO catheter and a astado 20 wire. I then switched out the wire for a [place] core wire and placed a 6 mm spider distal protection device in the above-the-knee popliteal artery. Prior to this I predilated the CTO with a 2.5 x 4 cm  Balloon. I then performed fractional atherectomy with the [facility] 1 device in a circumferential fashion removing a copious amount of atherosclerotic plaque. Following this I performed angioplasty with a 6 mm x 150 on the long Lutonix drug-eluting  balloon at 4 atm for 2-1/2 minutes. The final angiographic result with reduction of the total occlusion to 0% residual with excellent flow. The tibial vessels remained intact. I removed the distal protection device and demonstrated that it had contained atherosclerotic plaque. I did  a completion angiogram using bolus chase technique.  Final Impression: Mr. Patrice Paradise has an 80% proximal left internal carotid artery stenosis, bilateral short segment occlusions SFA status post directional atherectomy of left SFA with adjunctive drug-eluting balloon edge plasty with an excellent angiographic result. The patient received 600 mg of Plavix by mouth. The destination sheath was then withdrawn across the bifurcation and exchanged over a 0.35 cm wire for a short 7 [place] sheath. This appeared removed once the ACT falls below 170. Pressure will be held. The patient will be hydrated overnight, and discharged home in the morning. We will get follow-up Dopplers and then discuss staged intervention on his right SFA.    [name], [facility] 04/30/2014 4:12 PM

## 2014-04-30 NOTE — Interval H&P Note (Signed)
History and Physical Interval Note:  04/30/2014 2:08 PM  Anthony Brock  has presented today for surgery, with the diagnosis of pvd  The various methods of treatment have been discussed with the patient and family. After consideration of risks, benefits and other options for treatment, the patient has consented to  Procedure(s): LOWER EXTREMITY ANGIOGRAM (N/A) CAROTID ANGIOGRAM (N/A) as a surgical intervention .  The patient's history has been reviewed, patient examined, no change in status, stable for surgery.  I have reviewed the patient's chart and labs.  Questions were answered to the patient's satisfaction.     Runell GessBERRY,[name]

## 2014-04-30 NOTE — H&P (View-Only) (Signed)
     04/19/2014 Anthony Brock   02/21/1956  [id number]  Primary Physician No PCP Per Patient Primary Cardiologist: Anthony Benn J. Taqwa Deem MD FACP,FACC,FAHA, FSCAI  HPI:  [name] is a 58-year-old married African-American veteran who works at [employer]. He is referred by Dr. Jah at [facility] for peripheral vascular evaluation because of lifestyle limiting claudication. He has a long history of tobacco abuse smoking three-quarter pack a day for last 30 years. He has never had a heart attack or stroke, and denies chest pain or shortness of breath. He states that he has had left greater than right lower extremity claudication which is lifestyle limiting for several years. Recent Dopplers performed in our office 03/20/14 revealed a right ABI 0.58 with an occluded distal right SFA and a left ABI 0.41 with a high-frequency signal in his left SFA. I'm going to arrange for him to undergo angiography and potential percutaneous intervention.   No current outpatient prescriptions on file.   No current facility-administered medications for this visit.    No Known Allergies  History   Social History  . Marital Status: Married    Spouse Name: N/A    Number of Children: N/A  . Years of Education: N/A   Occupational History  . Not on file.   Social History Main Topics  . Smoking status: Current Every Day Smoker -- 0.75 packs/day for 39 years    Types: Cigarettes  . Smokeless tobacco: Not on file  . Alcohol Use: No  . Drug Use: No  . Sexual Activity: Yes   Other Topics Concern  . Not on file   Social History Narrative     Review of Systems: General: negative for chills, fever, night sweats or weight changes.  Cardiovascular: negative for chest pain, dyspnea on exertion, edema, orthopnea, palpitations, paroxysmal nocturnal dyspnea or shortness of breath Dermatological: negative for rash Respiratory: negative for cough or wheezing Urologic: negative for hematuria Abdominal:  negative for nausea, vomiting, diarrhea, bright red blood per rectum, melena, or hematemesis Neurologic: negative for visual changes, syncope, or dizziness All other systems reviewed and are otherwise negative except as noted above.    Blood pressure 153/84, pulse 71, height 6' 1" (1.854 m), weight 194 lb 12.8 oz (88.361 kg).  General appearance: alert and no distress Neck: no adenopathy, no JVD, supple, symmetrical, trachea midline, thyroid not enlarged, symmetric, no tenderness/mass/nodules and left carotid bruit Lungs: clear to auscultation bilaterally Heart: regular rate and rhythm, S1, S2 normal, no murmur, click, rub or gallop Extremities: extremities normal, atraumatic, no cyanosis or edema and absent pedal pulses, 2+ femorals without bruits  EKG not performed today  ASSESSMENT AND PLAN:   Claudication The patient hasn't been experiencing left greater than right lobe; claudication which is postop limiting for several years. He was evaluated by Dr. Jah at [facility] order Doppler studies performed in our office 03/20/14 revealing a right ABI 0.58 with an occluded distal right SFA and a left ABI of 0.41 the high-frequency signal in the distal left SFA. Angiography and potential intervention.      [name] J. [name] MD FACP,FACC,FAHA, FSCAI 04/19/2014 4:02 PM  

## 2014-04-30 NOTE — Research (Signed)
SAFE Informed Consent   Subject Name: Anthony Brock  Subject met inclusion and exclusion criteria.  The informed consent form, study requirements and expectations were reviewed with the subject and questions and concerns were addressed prior to the signing of the consent form.  The subject verbalized understanding of the trail requirements.  The subject agreed to participate in the safe trial and signed the informed consent.  The informed consent was obtained prior to performance of any protocol-specific procedures for the subject.  A copy of the signed informed consent was given to the subject and a copy was placed in the subject's medical record.  Halla Chopp 04/30/2014, 12:00

## 2014-05-01 ENCOUNTER — Encounter (HOSPITAL_COMMUNITY): Payer: Self-pay | Admitting: Physician Assistant

## 2014-05-01 ENCOUNTER — Other Ambulatory Visit: Payer: Self-pay | Admitting: Physician Assistant

## 2014-05-01 DIAGNOSIS — Z720 Tobacco use: Secondary | ICD-10-CM | POA: Diagnosis present

## 2014-05-01 DIAGNOSIS — I70213 Atherosclerosis of native arteries of extremities with intermittent claudication, bilateral legs: Secondary | ICD-10-CM | POA: Diagnosis not present

## 2014-05-01 DIAGNOSIS — I739 Peripheral vascular disease, unspecified: Secondary | ICD-10-CM | POA: Diagnosis present

## 2014-05-01 DIAGNOSIS — I6522 Occlusion and stenosis of left carotid artery: Secondary | ICD-10-CM | POA: Diagnosis present

## 2014-05-01 DIAGNOSIS — R739 Hyperglycemia, unspecified: Secondary | ICD-10-CM | POA: Diagnosis present

## 2014-05-01 DIAGNOSIS — I10 Essential (primary) hypertension: Secondary | ICD-10-CM | POA: Diagnosis present

## 2014-05-01 LAB — BASIC METABOLIC PANEL
Anion gap: 10 (ref 5–15)
BUN: 11 mg/dL (ref 6–23)
CO2: 22 mmol/L (ref 19–32)
Calcium: 8.8 mg/dL (ref 8.4–10.5)
Chloride: 102 mEq/L (ref 96–112)
Creatinine, Ser: 1.27 mg/dL (ref 0.50–1.35)
GFR calc Af Amer: 70 mL/min — ABNORMAL LOW (ref >90)
GFR calc non Af Amer: 60 mL/min — ABNORMAL LOW (ref >90)
Glucose, Bld: 261 mg/dL — ABNORMAL HIGH (ref 70–99)
Potassium: 4.2 mmol/L (ref 3.5–5.1)
Sodium: 134 mmol/L — ABNORMAL LOW (ref 135–145)

## 2014-05-01 LAB — CBC
HCT: 38.1 % — ABNORMAL LOW (ref 39.0–52.0)
Hemoglobin: 13.1 g/dL (ref 13.0–17.0)
MCH: 29.8 pg (ref 26.0–34.0)
MCHC: 34.4 g/dL (ref 30.0–36.0)
MCV: 86.8 fL (ref 78.0–100.0)
Platelets: 180 10*3/uL (ref 150–400)
RBC: 4.39 MIL/uL (ref 4.22–5.81)
RDW: 12.1 % (ref 11.5–15.5)
WBC: 7.7 10*3/uL (ref 4.0–10.5)

## 2014-05-01 MED ORDER — VARENICLINE TARTRATE 0.5 MG X 11 & 1 MG X 42 PO MISC: ORAL | Status: DC

## 2014-05-01 MED ORDER — ATORVASTATIN CALCIUM 40 MG PO TABS: 40.0000 mg | ORAL_TABLET | Freq: Every evening | ORAL | Status: DC

## 2014-05-01 MED ORDER — ASPIRIN EC 81 MG PO TBEC: 81.0000 mg | DELAYED_RELEASE_TABLET | Freq: Every day | ORAL | Status: DC

## 2014-05-01 MED ORDER — LISINOPRIL 5 MG PO TABS: 5.0000 mg | ORAL_TABLET | Freq: Every day | ORAL | Status: DC

## 2014-05-01 MED ORDER — ASPIRIN 81 MG PO TBEC: 81.0000 mg | DELAYED_RELEASE_TABLET | Freq: Every day | ORAL | Status: DC

## 2014-05-01 MED ORDER — ATORVASTATIN CALCIUM 40 MG PO TABS
40.0000 mg | ORAL_TABLET | Freq: Every day | ORAL | Status: DC
  Filled 2014-05-01: qty 1

## 2014-05-01 MED ORDER — LISINOPRIL 5 MG PO TABS
5.0000 mg | ORAL_TABLET | Freq: Every day | ORAL | Status: DC
  Filled 2014-05-01: qty 1

## 2014-05-01 MED ORDER — CLOPIDOGREL BISULFATE 75 MG PO TABS: 75.0000 mg | ORAL_TABLET | Freq: Every day | ORAL | Status: DC

## 2014-05-01 NOTE — Discharge Summary (Signed)
Discharge Summary   Patient ID: Anthony Brock MRN: [id number], DOB/AGE: 1956/01/01 59 y.o. Admit date: 04/30/2014 D/C date:     05/01/2014  Primary Care Provider: No PCP Per Patient Primary Cardiologist: Allyson Sabal (PV)  Primary Discharge Diagnoses:  1. PAD with lifestyle limiting claudication  - s/p PTA to L SFA with drug-eluting balloon angioplasty 04/30/14  - pending R SFA PTA 2. Carotid artery disease - 80% LICA 3. Tobacco abuse 4. Hyperglycemia 5. Intermittent elevated blood pressure - likely newly dx HTN  Hospital Course: Anthony Brock is a 59 y/o M with history of tobacco abuse x 30 years who was recently presented to Dr. Hazle Coca office for evaluation of lifestyle limiting claudication. He denied prior MI/stroke and denied CP or SOB. He has had left greater than right lower extremity claudication which has been lifestyle limiting for several years. Recent Dopplers performed in our office 03/20/14 revealed a right ABI 0.58 with an occluded distal right SFA and a left ABI 0.41 with a high-frequency signal in his left SFA. In addition, he had a carotid Doppler suggested a high-grade left internal carotid artery stenosis. Recent lipids showed trig 281, HDL 36, LDL 82, VLDL 52. Dr. Allyson Sabal recommended PV angiogram. He underwent PV angioplasty yesterday showing: - 80% prox LICA -> Dr. Allyson Sabal has put in motion enrollment into Crest trial - bilateral short segment occlusions SFA status post directional atherectomy of left SFA with adjunctive drug-eluting balloon angioplasty  The patient was loaded with Plavix. Dr. Allyson Sabal plans to see him back in the office and discuss staged intervention on his right SFA. The patient is feeling well today. Dr. Shirlee Latch has seen and examined the patient today and feels he is stable for discharge. He started atorvastatin  qpm. He also recommended Chantix initiation and prescription was provided. Consider f/u Lipids/LFTs in 6 weeks given statin initiation. Dr. Allyson Sabal initially  recommended f/u duplex per notes, but per our discussion would like to see the patient back to discuss R SFA intervention, after which he will plan to obtain post-PTA  duplex on both.  Of note, glucose this admission was 261. It was 15 as an outpatient labs 04/24/14. He was advised to f/u with PCP to further evaluate. No A1C available. He also had intermittent elevated BP during his stay, also recently elevated as outpatient. Lisinopril  daily was started. The patient was asked to monitor BP outside the hospital and call if running >130/80. Consider BMET in 1 week to ensure stability given ACE initiation. If he is still using Chantix at his f/u appointment he will need an rx sent in for the continuing pack. The patient states he was told by his cardiologist that he should not return to work until further clearance - work note was provided.  Discharge Vitals: Blood pressure 137/66, pulse 58, temperature 98.4 F (36.9 C), temperature source Oral, resp. rate 23, height  (1.854 m), weight 193 lb 12.6 oz (87.9 kg), SpO2 98 %.  Labs: Lab Results  Component Value Date   WBC 7.7 05/01/2014   HGB 13.1 05/01/2014   HCT 38.1* 05/01/2014   MCV 86.8 05/01/2014   PLT 180 05/01/2014    Recent Labs Lab 04/24/14 1418  05/01/14 0219  NA  --   < > 134*  K  --   < > 4.2  CL  --   < > 102  CO2  --   < > 22  BUN  --   < > 11  CREATININE  --   < >  1.27  CALCIUM  --   < > 8.8  PROT 7.6  --   --   BILITOT 0.5  --   --   ALKPHOS 57  --   --   ALT 18  --   --   AST 21  --   --   GLUCOSE  --   < > 261*  < > = values in this interval not displayed.  Lab Results  Component Value Date   CHOL 174 04/24/2014   HDL 36* 04/24/2014   LDLCALC 82 04/24/2014   TRIG 281* 04/24/2014     Diagnostic Studies/Procedures   Dg Chest 2 View  04/24/2014   CLINICAL DATA:  Preoperative exam prior to angiography. Patient reports 3 4 days of cough and upper respiratory symptoms ; chronic smoking history.  EXAM:  CHEST  2 VIEW  COMPARISON:  PA and lateral chest of December 14, 2009  FINDINGS: The lungs are hyperinflated. There is no focal infiltrate. The interstitial markings are coarse but stable. The heart and pulmonary vascularity appear normal. There is no pleural effusion. The bony thorax is unremarkable.  IMPRESSION: COPD.  There is no active cardiopulmonary disease.   Electronically Signed   By: David  [place]   On: 04/24/2014 16:43   PV angio - see report.  Discharge Medications   Current Discharge Medication List    START taking these medications   Details  aspirin EC 81 MG EC tablet Take 1 tablet (81 mg total) by mouth daily. Qty: 30 tablet, Refills: 11    atorvastatin (LIPITOR) 40 MG tablet Take 1 tablet (40 mg total) by mouth every evening. Qty: 30 tablet, Refills: 6    clopidogrel (PLAVIX) 75 MG tablet Take 1 tablet (75 mg total) by mouth daily. Qty: 30 tablet, Refills: 6    lisinopril (PRINIVIL,ZESTRIL) 5 MG tablet Take 1 tablet (5 mg total) by mouth daily. Qty: 30 tablet, Refills: 6    varenicline (CHANTIX STARTING MONTH PAK) 0.5 MG X 11 & 1 MG X 42 tablet Take one 0.5 mg tablet by mouth once daily for 3 days, then increase to one 0.5 mg tablet twice daily for 4 days, then increase to one 1 mg tablet twice daily. Qty: 53 tablet, Refills: 0        Disposition   The patient will be discharged in stable condition to home. Discharge Instructions    Diet - low sodium heart healthy    Complete by:  As directed      Increase activity slowly    Complete by:  As directed   No driving for 2 days. No lifting over 5 lbs for 1 week. No sexual activity for 1 week. Keep procedure site clean & dry. If you notice increased pain, swelling, bleeding or pus, call/return!  You may shower, but no soaking baths/hot tubs/pools for 1 week.          Follow-up Information    Follow up with Runell GessBERRY,JONATHAN J, MD.   Specialty:  Cardiology   Why:  Office will call you for your followup appointment.  Call office if you have not heard back in 3 days.   Contact information:   [address] [place] [address] [phone]         Duration of Discharge Encounter: Greater than 30 minutes including physician and PA time.  Signed, Ronie Spiesayna Danniela Mcbrearty PA-C 05/01/2014, 10:48 AM

## 2014-05-01 NOTE — Progress Notes (Signed)
Patient ID: Anthony Brock, male   DOB: 09/30/1955, 59 y.o.   MRN: [id number]   SUBJECTIVE: No complaints, doing well.   Scheduled Meds: . aspirin EC  325 mg Oral Daily  . clopidogrel  75 mg Oral Q breakfast   Continuous Infusions:  PRN Meds:.acetaminophen, alum & mag hydroxide-simeth, hydrALAZINE, ondansetron (ZOFRAN) IV    Filed Vitals:   05/01/14 0300 05/01/14 0400 05/01/14 0600 05/01/14 0800  BP: 151/59 170/58 146/60 132/68  Pulse:      Temp: 98.4 F (36.9 C)   98.4 F (36.9 C)  TempSrc: Oral   Oral  Resp: 23 25 21 23   Height:      Weight: 193 lb 12.6 oz (87.9 kg)     SpO2: 97% 98% 98% 98%    Intake/Output Summary (Last 24 hours) at 05/01/14 1005 Last data filed at 05/01/14 0900  Gross per 24 hour  Intake 1021.25 ml  Output    650 ml  Net 371.25 ml    LABS: Basic Metabolic Panel:  Recent Labs  40/98/1101/19/16 0219  NA 134*  K 4.2  CL 102  CO2 22  GLUCOSE 261*  BUN 11  CREATININE 1.27  CALCIUM 8.8   Liver Function Tests: No results for input(s): AST, ALT, ALKPHOS, BILITOT, PROT, ALBUMIN in the last 72 hours. No results for input(s): LIPASE, AMYLASE in the last 72 hours. CBC:  Recent Labs  05/01/14 0219  WBC 7.7  HGB 13.1  HCT 38.1*  MCV 86.8  PLT 180   Cardiac Enzymes: No results for input(s): CKTOTAL, CKMB, CKMBINDEX, TROPONINI in the last 72 hours. BNP: Invalid input(s): POCBNP D-Dimer: No results for input(s): DDIMER in the last 72 hours. Hemoglobin A1C: No results for input(s): HGBA1C in the last 72 hours. Fasting Lipid Panel: No results for input(s): CHOL, HDL, LDLCALC, TRIG, CHOLHDL, LDLDIRECT in the last 72 hours. Thyroid Function Tests: No results for input(s): TSH, T4TOTAL, T3FREE, THYROIDAB in the last 72 hours.  Invalid input(s): FREET3 Anemia Panel: No results for input(s): VITAMINB12, FOLATE, FERRITIN, TIBC, IRON, RETICCTPCT in the last 72 hours.  RADIOLOGY: Dg Chest 2 View  04/24/2014   CLINICAL DATA:  Preoperative exam prior  to angiography. Patient reports 3 4 days of cough and upper respiratory symptoms ; chronic smoking history.  EXAM: CHEST  2 VIEW  COMPARISON:  PA and lateral chest of December 14, 2009  FINDINGS: The lungs are hyperinflated. There is no focal infiltrate. The interstitial markings are coarse but stable. The heart and pulmonary vascularity appear normal. There is no pleural effusion. The bony thorax is unremarkable.  IMPRESSION: COPD.  There is no active cardiopulmonary disease.   Electronically Signed   By: David  [place]az[place]Jordan   On: 04/24/2014 16:43    PHYSICAL EXAM General: NAD Neck: No JVD, no thyromegaly or thyroid nodule.  Lungs: Clear to auscultation bilaterally with normal respiratory effort. CV: Nondisplaced PMI.  Heart regular S1/S2, no S3/S4, no murmur.  No peripheral edema.   Abdomen: Soft, nontender, no hepatosplenomegaly, no distention.  Neurologic: Alert and oriented x 3.  Psych: Normal affect. Extremities: No clubbing or cyanosis. Right groin cath site benign.   TELEMETRY: Reviewed telemetry pt in NSR  ASSESSMENT AND PLAN: 59 yo with history of PAD had peripheral angiography and left SFA intervention yesterday.   1. PAD: 80% LICA stenosis, will be evaluated for carotid stent.  Subtotally occluded right and left SFAs.  Left SFA PCI yesterday, plan to bring him back for right SFA PCI.  Stable post-procedure.  - Continue ASA + Plavix - Add atorvastatin 40 daily.  2. Smoking: Encouraged him to quit.  Will give Chantix prescription.  3. Disposition: Home today.  Followup 1-2 weeks Dr. Allyson Sabal.  Meds; Plavix 75 daily, ASA 81 daily, atorvastatin 40 daily, Chantix.   Anthony Brock 05/01/2014 10:09 AM

## 2014-05-01 NOTE — Progress Notes (Signed)
Inpatient Diabetes Program Recommendations  AACE/ADA: New Consensus Statement on Inpatient Glycemic Control (2013)  Target Ranges:  Prepandial:   less than 140 mg/dL      Peak postprandial:   less than 180 mg/dL (1-2 hours)      Critically ill patients:  140 - 180 mg/dL  Results for Jearld LeschJOYNER, Terren D (MRN [id number]) as of 05/01/2014 08:59  Ref. Range 05/01/2014 02:19  Glucose Latest Range: 70-99 mg/dL 865261 (H)   Lab glucose elevated this morning. Recommend monitoring CBGs TID. Thank you  Piedad ClimesGina Safiyah Cisney BSN, RN,CDE Inpatient Diabetes Coordinator [phone] (team pager)

## 2014-05-01 NOTE — Progress Notes (Signed)
Utilization Review Completed.Marnie Fazzino T1/19/2016  

## 2014-05-01 NOTE — Progress Notes (Signed)
Right femoral sheath removed at 2335, pressure held for 20 minutes with no complications. Site assessment at a level 0. Sterile dressing applied and patient instructed to remain on bedrest for 4 hours post sheath removal.

## 2014-05-02 ENCOUNTER — Telehealth: Payer: Self-pay | Admitting: Cardiovascular Disease

## 2014-05-02 NOTE — Telephone Encounter (Signed)
Closed encounter °

## 2014-05-07 ENCOUNTER — Telehealth: Payer: Self-pay | Admitting: Cardiovascular Disease

## 2014-05-07 NOTE — Telephone Encounter (Signed)
Returned call to patient he wanted to let Dr.Berry know he has been having tightness in left thigh down to knee.Stated he notices tightness after he has been up on leg.Stated he had a PV angiogram 04/30/14.Stated groin site looks good,no swelling no redness.Stated he has post hospital follow up with Fransico MichaelBrittiany Simmons PA 05/15/14.Stated he wanted to make sure this is ok.Advised Dr.Berry out of office will send message to him for advice.

## 2014-05-07 NOTE — Telephone Encounter (Signed)
Pt had procedure on 04-30-14,at the end of the day in his thigh area he feels tightness and soreness.

## 2014-05-09 NOTE — Telephone Encounter (Signed)
Lmom.  I was calling to check on his leg.

## 2014-05-10 NOTE — Telephone Encounter (Signed)
I spoke with patient and his leg is feeling better.  He denies any swelling or oozing from the insertion site.  I informed him that his disability papers are signed and will be sent back to [employer] for processing.

## 2014-05-10 NOTE — Telephone Encounter (Signed)
Is his leg feeling any better??

## 2014-05-15 ENCOUNTER — Encounter: Payer: Self-pay | Admitting: Cardiovascular Disease

## 2014-05-15 ENCOUNTER — Encounter: Payer: Self-pay | Admitting: Cardiology

## 2014-05-15 ENCOUNTER — Ambulatory Visit (INDEPENDENT_AMBULATORY_CARE_PROVIDER_SITE_OTHER): Payer: BLUE CROSS/BLUE SHIELD | Admitting: Cardiology

## 2014-05-15 VITALS — BP 170/74 | HR 76 | Ht 73.0 in | Wt 192.0 lb

## 2014-05-15 DIAGNOSIS — Z01818 Encounter for other preprocedural examination: Secondary | ICD-10-CM

## 2014-05-15 DIAGNOSIS — I739 Peripheral vascular disease, unspecified: Secondary | ICD-10-CM

## 2014-05-15 NOTE — Progress Notes (Signed)
05/15/2014 Anthony LeschLarry D Brock   02/10/1956  [id number]  Primary Physician No PCP Per Patient Primary Cardiologist: Dr. Allyson SabalBerry  Reason for visit: Follow-up for peripheral vascular/carotid artery disease  HPI:  The patient is a 59 year old African-American male, followed by Dr. Allyson SabalBerry for peripheral vascular disease. He has a long history of tobacco abuse, smoking three 1/4ppd for the last 30 years. He has no prior history of heart attack or stroke. He was referred to Dr. Allyson SabalBerry by [facility] after complaining of lifestyle limiting claudication. He has had left greater than right lower extremity claudication for several years. Doppler studies in our office 03/20/2014 revealed a right ABI of 0.58 with an occluded distal right SFA and a left ABI of 0.41 with a high-frequency signal in his left SFA. Subsequently, Dr. Allyson SabalBerry arranged for him to undergo angiograpy and potential percutaneous intervention. Dr. Allyson SabalBerry also recommended performing carotid angiography during the same procedure. This was was performed [facility] on 04[facility]30/2014. This demonstrated 80% proximal left internal carotid artery stenosis. In addition he was noted to have short segment bilateral mid SFA occlusion. This was treated with directional atherectomy of the left SFA with adjunctive drug-eluting balloon angioplasty. This resulted in an excellent angiographic result. He was placed on dual antiplatelet therapy with aspirin plus Plavix. For his carotid artery disease, Dr. Allyson SabalBerry recommended enrollment into the Crest trial.  Since his procedure, he notes significant improvement in his left leg. He denies any further claudication on the left. He continues to have right sided claudication. In regards to his carotid artery disease, he denies any s/s of stroke or TIA. No syncope/ near syncope. He notes that he has cut back on tobacco use significantly but continues to smoke cigarettes occasionally. He has  been fully compliant with ASA + Plavix.   Current Outpatient Prescriptions  Medication Sig Dispense Refill  . aspirin EC 81 MG EC tablet Take 1 tablet (81 mg total) by mouth daily. 30 tablet 11  . atorvastatin (LIPITOR) 40 MG tablet Take 1 tablet (40 mg total) by mouth every evening. 30 tablet 6  . clopidogrel (PLAVIX) 75 MG tablet Take 1 tablet (75 mg total) by mouth daily. 30 tablet 6  . lisinopril (PRINIVIL,ZESTRIL) 5 MG tablet Take 1 tablet (5 mg total) by mouth daily. 30 tablet 6  . LUZU 1 % CREA Apply 1 application topically as needed.     No current facility-administered medications for this visit.    No Known Allergies  History   Social History  . Marital Status: Married    Spouse Name: N/A    Number of Children: N/A  . Years of Education: N/A   Occupational History  . Not on file.   Social History Main Topics  . Smoking status: Current Every Day Smoker -- 0.75 packs/day for 39 years    Types: Cigarettes  . Smokeless tobacco: Not on file  . Alcohol Use: No  . Drug Use: No  . Sexual Activity: Yes   Other Topics Concern  . Not on file   Social History Narrative    Family History  Problem Relation Age of Onset  . Alzheimer's disease Mother   . Emphysema Father   . Alzheimer's disease Sister     Review of Systems: General: negative for chills, fever, night sweats or weight changes.  Cardiovascular: negative for chest pain, dyspnea on exertion, edema, orthopnea, palpitations, paroxysmal nocturnal dyspnea or shortness of breath Dermatological: negative for  rash Respiratory: negative for cough or wheezing Urologic: negative for hematuria Abdominal: negative for nausea, vomiting, diarrhea, bright red blood per rectum, melena, or hematemesis Neurologic: negative for visual changes, syncope, or dizziness All other systems reviewed and are otherwise negative except as noted above.    Blood pressure 170/74, pulse 76, height 6\' 1"  (1.854 m), weight 192 lb (87.091  kg).  General appearance: alert, cooperative and no distress Neck: + left carotid bruit and no JVD Lungs: clear to auscultation bilaterally Heart: regular rate and rhythm, S1, S2 normal, no murmur, click, rub or gallop Extremities: no LEE Pulses: 2+ and symmetric Skin: warm and dry Neurologic: Grossly normal  EKG not performed  ASSESSMENT AND PLAN:   1. PVD: s/p left SFA angioplasty. No recurrent left sided claudication.  Still with residual right SFA disase. [facility] disease next week with Dr. Allyson SabalBerry. Continue ASA + Plavix.   2. Left Carotid Artery Stenosis: 80 % stenosis. Asymptomatic. Dr. Allyson SabalBerry discussed at length the option of enrolling in the Crest Trial. Patient was given additional patient education materials. He will consider enrolling and will discuss further with Dr. Allyson SabalBerry at his next f/u, post PV intervention.  Continue ASA + Plavix.  3. Tobacco abuse: The association of tobacco use with cardiovascular disease was discussed with the patient. Smoking cessation was strongly advise.   PLAN  Will schedule for PV angiogram for attempt at intervention of the right SFA with Dr. Allyson SabalBerry next week. Continue aspirin and Plavix. Smoking cessation strongly advise.  Demiana Crumbley, BRITTAINYPA-C 05/15/2014 5:28 PM

## 2014-05-15 NOTE — Patient Instructions (Addendum)
Dr. Berry has ordered a peripheral angiogram to be done at [facility].  This procedure is going to look at the bloodflow in your lower extremities.  If Dr. Berry is able to open up the arteries, you will have to spend one night in the hospital.  If he is not able to open the arteries, you will be able to go home that same day.    After the procedure, you will not be allowed to drive for 3 days or push, pull, or lift anything greater than 10 lbs for one week.    You will be required to have the following tests prior to the procedure:  1. Blood work-the blood work can be done no more than 7 days prior to the procedure.  It can be done at any [facility] lab.  There is one downstairs on the first floor of this building and one in the [facility] ([address])  *REPS Scott Left groin access    

## 2014-05-16 NOTE — Addendum Note (Signed)
Addended by: Roemello Speyer, [place] on: 05/16/2014 02:09 PM   Modules accepted: Orders

## 2014-05-17 ENCOUNTER — Other Ambulatory Visit: Payer: Self-pay

## 2014-05-17 LAB — CBC
HCT: 38.7 % — ABNORMAL LOW (ref 39.0–52.0)
Hemoglobin: 13.3 g/dL (ref 13.0–17.0)
MCH: 30 pg (ref 26.0–34.0)
MCHC: 34.4 g/dL (ref 30.0–36.0)
MCV: 87.2 fL (ref 78.0–100.0)
MPV: 10 fL (ref 8.6–12.4)
PLATELETS: 237 10*3/uL (ref 150–400)
RBC: 4.44 MIL/uL (ref 4.22–5.81)
RDW: 12.2 % (ref 11.5–15.5)
WBC: 4.4 10*3/uL (ref 4.0–10.5)

## 2014-05-17 LAB — APTT: aPTT: 28 seconds (ref 24–37)

## 2014-05-17 LAB — PROTIME-INR
INR: 1.02 (ref <1.50)
PROTHROMBIN TIME: 13.4 s (ref 11.6–15.2)

## 2014-05-17 LAB — BASIC METABOLIC PANEL
BUN: 12 mg/dL (ref 6–23)
CO2: 27 meq/L (ref 19–32)
CREATININE: 1.02 mg/dL (ref 0.50–1.35)
Calcium: 9.9 mg/dL (ref 8.4–10.5)
Chloride: 99 mEq/L (ref 96–112)
GLUCOSE: 209 mg/dL — AB (ref 70–99)
Potassium: 4.6 mEq/L (ref 3.5–5.3)
SODIUM: 133 meq/L — AB (ref 135–145)

## 2014-05-17 NOTE — Telephone Encounter (Signed)
Received signed [facility] Attending Physician Statement from Dr Allyson SabalBerry.  [facility]10/14 and notified patient.

## 2014-05-23 ENCOUNTER — Encounter: Payer: Self-pay | Admitting: *Deleted

## 2014-05-24 ENCOUNTER — Encounter (HOSPITAL_COMMUNITY): Payer: Self-pay | Admitting: General Practice

## 2014-05-24 ENCOUNTER — Encounter (HOSPITAL_COMMUNITY)
Admission: RE | Disposition: A | Discharge status: Home / Self Care | Payer: Self-pay | Source: Ambulatory Visit | Attending: Cardiovascular Disease

## 2014-05-24 ENCOUNTER — Ambulatory Visit (HOSPITAL_COMMUNITY)
Admission: RE | Admit: 2014-05-24 | Discharge: 2014-05-25 | Disposition: A | Discharge status: Home / Self Care | Payer: BLUE CROSS/BLUE SHIELD | Source: Ambulatory Visit | Attending: Cardiovascular Disease | Admitting: Cardiovascular Disease

## 2014-05-24 DIAGNOSIS — F17210 Nicotine dependence, cigarettes, uncomplicated: Secondary | ICD-10-CM | POA: Insufficient documentation

## 2014-05-24 DIAGNOSIS — Z01818 Encounter for other preprocedural examination: Secondary | ICD-10-CM

## 2014-05-24 DIAGNOSIS — I70211 Atherosclerosis of native arteries of extremities with intermittent claudication, right leg: Secondary | ICD-10-CM | POA: Insufficient documentation

## 2014-05-24 DIAGNOSIS — Z23 Encounter for immunization: Secondary | ICD-10-CM | POA: Diagnosis not present

## 2014-05-24 DIAGNOSIS — Z7902 Long term (current) use of antithrombotics/antiplatelets: Secondary | ICD-10-CM | POA: Diagnosis not present

## 2014-05-24 DIAGNOSIS — I739 Peripheral vascular disease, unspecified: Secondary | ICD-10-CM

## 2014-05-24 DIAGNOSIS — Z7982 Long term (current) use of aspirin: Secondary | ICD-10-CM | POA: Insufficient documentation

## 2014-05-24 DIAGNOSIS — I6522 Occlusion and stenosis of left carotid artery: Secondary | ICD-10-CM | POA: Diagnosis not present

## 2014-05-24 HISTORY — DX: Gastro-esophageal reflux disease without esophagitis: K21.9

## 2014-05-24 HISTORY — DX: Unspecified osteoarthritis, unspecified site: M19.90

## 2014-05-24 HISTORY — DX: Pure hypercholesterolemia, unspecified: E78.00

## 2014-05-24 HISTORY — PX: TRANSLUMINAL ATHERECTOMY FEMORAL ARTERY: SHX2567

## 2014-05-24 LAB — POCT ACTIVATED CLOTTING TIME
ACTIVATED CLOTTING TIME: 177 s
ACTIVATED CLOTTING TIME: 171 s
Activated Clotting Time: 220 seconds
Activated Clotting Time: 227 seconds
Activated Clotting Time: 239 seconds

## 2014-05-24 SURGERY — ATHERECTOMY PERIPHERAL ARTERY
Laterality: Right

## 2014-05-24 MED ORDER — ATORVASTATIN CALCIUM 40 MG PO TABS
40.0000 mg | ORAL_TABLET | Freq: Every evening | ORAL | Status: DC
  Administered 2014-05-24: 40 mg via ORAL
  Filled 2014-05-24 (×2): qty 1

## 2014-05-24 MED ORDER — ASPIRIN EC 325 MG PO TBEC
325.0000 mg | DELAYED_RELEASE_TABLET | Freq: Every day | ORAL | Status: DC
  Administered 2014-05-25: 09:00:00 325 mg via ORAL
  Filled 2014-05-24: qty 1

## 2014-05-24 MED ORDER — SODIUM CHLORIDE 0.9 % IV SOLN
INTRAVENOUS | Status: AC
Start: 2014-05-24 — End: 2014-05-24

## 2014-05-24 MED ORDER — ONDANSETRON HCL 4 MG/2ML IJ SOLN: 4.0000 mg | Freq: Four times a day (QID) | INTRAMUSCULAR | Status: DC | PRN

## 2014-05-24 MED ORDER — ASPIRIN EC 81 MG PO TBEC
81.0000 mg | DELAYED_RELEASE_TABLET | Freq: Every day | ORAL | Status: DC
  Filled 2014-05-24: qty 1

## 2014-05-24 MED ORDER — MORPHINE SULFATE 2 MG/ML IJ SOLN: 2.0000 mg | INTRAMUSCULAR | Status: DC | PRN

## 2014-05-24 MED ORDER — ASPIRIN 81 MG PO CHEW
CHEWABLE_TABLET | ORAL | Status: AC
  Administered 2014-05-24: 81 mg via ORAL
  Filled 2014-05-24: qty 1

## 2014-05-24 MED ORDER — HEPARIN (PORCINE) IN NACL 2-0.9 UNIT/ML-% IJ SOLN
INTRAMUSCULAR | Status: AC
  Filled 2014-05-24: qty 1000

## 2014-05-24 MED ORDER — ACETAMINOPHEN 325 MG PO TABS: 650.0000 mg | ORAL_TABLET | ORAL | Status: DC | PRN

## 2014-05-24 MED ORDER — HEPARIN SODIUM (PORCINE) 1000 UNIT/ML IJ SOLN
INTRAMUSCULAR | Status: AC
  Filled 2014-05-24: qty 1

## 2014-05-24 MED ORDER — ASPIRIN 81 MG PO CHEW
81.0000 mg | CHEWABLE_TABLET | ORAL | Status: AC
  Administered 2014-05-24: 81 mg via ORAL

## 2014-05-24 MED ORDER — CLOPIDOGREL BISULFATE 75 MG PO TABS: 75.0000 mg | ORAL_TABLET | Freq: Every day | ORAL | Status: DC

## 2014-05-24 MED ORDER — FENTANYL CITRATE 0.05 MG/ML IJ SOLN
INTRAMUSCULAR | Status: AC
  Filled 2014-05-24: qty 2

## 2014-05-24 MED ORDER — LISINOPRIL 5 MG PO TABS
5.0000 mg | ORAL_TABLET | Freq: Every day | ORAL | Status: DC
  Administered 2014-05-25: 5 mg via ORAL
  Filled 2014-05-24 (×2): qty 1

## 2014-05-24 MED ORDER — PNEUMOCOCCAL VAC POLYVALENT 25 MCG/0.5ML IJ INJ
0.5000 mL | INJECTION | INTRAMUSCULAR | Status: AC
  Administered 2014-05-25: 09:00:00 0.5 mL via INTRAMUSCULAR
  Filled 2014-05-24: qty 0.5

## 2014-05-24 MED ORDER — CLOPIDOGREL BISULFATE 75 MG PO TABS
75.0000 mg | ORAL_TABLET | Freq: Every day | ORAL | Status: DC
  Administered 2014-05-25: 75 mg via ORAL
  Filled 2014-05-24: qty 1

## 2014-05-24 MED ORDER — LIDOCAINE HCL (PF) 1 % IJ SOLN
INTRAMUSCULAR | Status: AC
  Filled 2014-05-24: qty 30

## 2014-05-24 MED ORDER — SODIUM CHLORIDE 0.9 % IV SOLN
INTRAVENOUS | Status: DC
  Administered 2014-05-24: 06:00:00 via INTRAVENOUS

## 2014-05-24 MED ORDER — MIDAZOLAM HCL 2 MG/2ML IJ SOLN
INTRAMUSCULAR | Status: AC
  Filled 2014-05-24: qty 2

## 2014-05-24 MED ORDER — SODIUM CHLORIDE 0.9 % IJ SOLN: 3.0000 mL | INTRAMUSCULAR | Status: DC | PRN

## 2014-05-24 NOTE — CV Procedure (Signed)
Anthony Brock is a 59 y.o. male    [id number] LOCATION:  FACILITY: [facility]  PHYSICIAN: Nanetta Batty, M.D. Jan 16, 1956   DATE OF PROCEDURE:  05/24/2014  DATE OF DISCHARGE:     PV Angiogram/Intervention    History obtained from chart review.: Anthony Brock is a 59 year old married African-American veteran who works at [employer]. He is referred by Dr. Fanny Dance at [facility] for peripheral vascular evaluation because of lifestyle limiting claudication. He has a long history of tobacco abuse smoking three-quarter pack a day for last 30 years. He has never had a heart attack or stroke, and denies chest pain or shortness of breath. He states that he has had left greater than right lower extremity claudication which is lifestyle limiting for several years. Recent Dopplers performed in our office 03/20/14 revealed a right ABI 0.58 with an occluded distal right SFA and a left ABI 0.41 with a high-frequency signal in his left SFA. He underwent angiography and intervention on January 18 revealing a 80% right internal carotid artery stenosis, and short segment occlusions in both SFAs. I was able to perform directional atherectomy on his left SFA followed by drug eluding balloon angioplasty. He had an excellent angiographic and clinical result. He presents now for staged right SFA intervention.  PROCEDURE DESCRIPTION:   The patient was brought to the second floor [facility] cath lab in the postabsorptive state. He was premedicated with Valium 5 mg by mouth, IV Versed and fentanyl. His left groin was prepped and shaved in usual sterile fashion. Xylocaine 1% was used for local anesthesia. A 5 French sheath was inserted into the left common femoral artery using standard Seldinger technique.a 5 [place] crossover catheter was used to obtain contralateral access using a 035 Glidewire and Rosen wire. A 7 [place] multipurpose destination sheath was then advanced over the Marks wire into the right external  iliac artery.  HEMODYNAMICS:    AO SYSTOLIC/AO DIASTOLIC: 125/77    Procedure Description:I was unable to cross the lesion with a Vioxx CTO Crossing catheter. Following this I was able to use an 035 angled Navicross  End catheter along with a 014. Astato 20  CTO wire. After much modulation I was able to gain access into the distal vessel beyond the occluded segment. Following this I ballooned the occluded segment with a 2.5 mm x 60 mm long saber antiplastic balloon and exchanged the wire for a Sparta core. Following this I placed a 7 mm spider distal protection device in the above-the-knee popliteal artery. The patient received a total of 11,500 units of heparin with an ACT of 227. Total contrast used during the case was 196 mL. Following this I used a hawk 1 directional atherectomy device and performed multiple circumferential cuts removing a copious amount of atherosclerotic plaque. I then used a 6 mm x 100 mm long lived tonic drug-eluting balloon at low pressure for 20 half minutes resulting in reduction of a short segment total occlusion to 0% residual. I would treat the sheath across the bifurcation after retrieving the distal protection device and placed the pigtail catheter back in the abdominal aorta and performed bifemoral runoff using bolus chase digital subtraction step table technique.   Final Impression: successful turbo [facility] atherectomy and drug-eluting balloon angioplasty of a short segment total occlusion in the mid right SFA with completion angiography revealing the left SFA to be widely patent. The patient tolerated the procedure well. The 7 [place] destination sheath was exchanged over an 035 wire for  a short 7 [place]French sheath. The patient left without the lab in stable condition. He is on dual antibiotic therapy. He'll be hydrated overnight. The sheath will be removed once the ACT falls below 170 and pressure held. He'll be discharged home in the morning and obtain follow-up lower extremity  arterial Doppler studies [facility] next week. He will see a mid-level provider back in 2-3 weeks on a daily I am in the office. At that time we will also discuss the possibility of enrolling him in the CREST 2  stent arm for his 80% right internal carotid artery stenosis.    Anthony Brock,Anthony Vanderloop J. MD, [facility] 05/24/2014 9:47 AM

## 2014-05-24 NOTE — H&P (View-Only) (Signed)
05/15/2014 Jearld LeschLarry D Perrone   02/10/1956  [id number]  Primary Physician No PCP Per Patient Primary Cardiologist: Dr. Allyson SabalBerry  Reason for visit: Follow-up for peripheral vascular/carotid artery disease  HPI:  The patient is a 59 year old African-American male, followed by Dr. Allyson SabalBerry for peripheral vascular disease. He has a long history of tobacco abuse, smoking three 1/4ppd for the last 30 years. He has no prior history of heart attack or stroke. He was referred to Dr. Allyson SabalBerry by [facility] for [facility] after complaining of lifestyle limiting claudication. He has had left greater than right lower extremity claudication for several years. Doppler studies in our office 03/20/2014 revealed a right ABI of 0.58 with an occluded distal right SFA and a left ABI of 0.41 with a high-frequency signal in his left SFA. Subsequently, Dr. Allyson SabalBerry arranged for him to undergo angiograpy and potential percutaneous intervention. Dr. Allyson SabalBerry also recommended performing carotid angiography during the same procedure. This was was [facility] on 04[facility]/2014. This demonstrated 80% proximal left internal carotid artery stenosis. In addition he was noted to have short segment bilateral mid SFA occlusion. This was treated with directional atherectomy of the left SFA with adjunctive drug-eluting balloon angioplasty. This resulted in an excellent angiographic result. He was placed on dual antiplatelet therapy with aspirin plus Plavix. For his carotid artery disease, Dr. Allyson SabalBerry recommended enrollment into the Crest trial.  Since his procedure, he notes significant improvement in his left leg. He denies any further claudication on the left. He continues to have right sided claudication. In regards to his carotid artery disease, he denies any s/s of stroke or TIA. No syncope/ near syncope. He notes that he has cut back on tobacco use significantly but continues to smoke cigarettes occasionally. He has  been fully compliant with ASA + Plavix.   Current Outpatient Prescriptions  Medication Sig Dispense Refill  . aspirin EC 81 MG EC tablet Take 1 tablet (81 mg total) by mouth daily. 30 tablet 11  . atorvastatin (LIPITOR) 40 MG tablet Take 1 tablet (40 mg total) by mouth every evening. 30 tablet 6  . clopidogrel (PLAVIX) 75 MG tablet Take 1 tablet (75 mg total) by mouth daily. 30 tablet 6  . lisinopril (PRINIVIL,ZESTRIL) 5 MG tablet Take 1 tablet (5 mg total) by mouth daily. 30 tablet 6  . LUZU 1 % CREA Apply 1 application topically as needed.     No current facility-administered medications for this visit.    No Known Allergies  History   Social History  . Marital Status: Married    Spouse Name: N/A    Number of Children: N/A  . Years of Education: N/A   Occupational History  . Not on file.   Social History Main Topics  . Smoking status: Current Every Day Smoker -- 0.75 packs/day for 39 years    Types: Cigarettes  . Smokeless tobacco: Not on file  . Alcohol Use: No  . Drug Use: No  . Sexual Activity: Yes   Other Topics Concern  . Not on file   Social History Narrative    Family History  Problem Relation Age of Onset  . Alzheimer's disease Mother   . Emphysema Father   . Alzheimer's disease Sister     Review of Systems: General: negative for chills, fever, night sweats or weight changes.  Cardiovascular: negative for chest pain, dyspnea on exertion, edema, orthopnea, palpitations, paroxysmal nocturnal dyspnea or shortness of breath Dermatological: negative for  rash Respiratory: negative for cough or wheezing Urologic: negative for hematuria Abdominal: negative for nausea, vomiting, diarrhea, bright red blood per rectum, melena, or hematemesis Neurologic: negative for visual changes, syncope, or dizziness All other systems reviewed and are otherwise negative except as noted above.    Blood pressure 170/74, pulse 76, height 6\' 1"  (1.854 m), weight 192 lb (87.091  kg).  General appearance: alert, cooperative and no distress Neck: + left carotid bruit and no JVD Lungs: clear to auscultation bilaterally Heart: regular rate and rhythm, S1, S2 normal, no murmur, click, rub or gallop Extremities: no LEE Pulses: 2+ and symmetric Skin: warm and dry Neurologic: Grossly normal  EKG not performed  ASSESSMENT AND PLAN:   1. PVD: s/p left SFA angioplasty. No recurrent left sided claudication.  Still with residual right SFA disase. [facility] disease next week with Dr. Allyson SabalBerry. Continue ASA + Plavix.   2. Left Carotid Artery Stenosis: 80 % stenosis. Asymptomatic. Dr. Allyson SabalBerry discussed at length the option of enrolling in the Crest Trial. Patient was given additional patient education materials. He will consider enrolling and will discuss further with Dr. Allyson SabalBerry at his next f/u, post PV intervention.  Continue ASA + Plavix.  3. Tobacco abuse: The association of tobacco use with cardiovascular disease was discussed with the patient. Smoking cessation was strongly advise.   PLAN  Will schedule for PV angiogram for attempt at intervention of the right SFA with Dr. Allyson SabalBerry next week. Continue aspirin and Plavix. Smoking cessation strongly advise.  Deberah Adolf, BRITTAINYPA-C 05/15/2014 5:28 PM

## 2014-05-24 NOTE — Interval H&P Note (Signed)
History and Physical Interval Note:  05/24/2014 7:44 AM  Anthony Brock  has presented today for surgery, with the diagnosis of pad  The various methods of treatment have been discussed with the patient and family. After consideration of risks, benefits and other options for treatment, the patient has consented to  Procedure(s): LOWER EXTREMITY ANGIOGRAM (N/A) as a surgical intervention .  The patient's history has been reviewed, patient examined, no change in status, stable for surgery.  I have reviewed the patient's chart and labs.  Questions were answered to the patient's satisfaction.     Runell GessBERRY,Spence Soberano J

## 2014-05-24 NOTE — Progress Notes (Signed)
1320 Patient called and bleeding noted oozing medially from dressing, dressing dry and intact at cath let groin site. Dressing removed and pressure held for 15 minutes. New dressing applied with noted bleeding at 1335. 4 hours bedrest started at 1335. Instructed patient to call if further bleeding noted.

## 2014-05-24 NOTE — Progress Notes (Signed)
Site area: left groin  Site Prior to Removal:  Level 0  Pressure Applied For 20 MINUTES    Minutes Beginning at 1155  Manual:   Yes.    Patient Status During Pull:  stable  Post Pull Groin Site:  Level 0  Post Pull Instructions Given:  Yes.    Post Pull Pulses Present:  Yes.    Dressing Applied:  Yes.    Comments:  Tolerated well

## 2014-05-25 ENCOUNTER — Other Ambulatory Visit: Payer: Self-pay | Admitting: Physician Assistant

## 2014-05-25 ENCOUNTER — Encounter: Payer: Self-pay | Admitting: Physician Assistant

## 2014-05-25 DIAGNOSIS — I70211 Atherosclerosis of native arteries of extremities with intermittent claudication, right leg: Secondary | ICD-10-CM | POA: Diagnosis not present

## 2014-05-25 DIAGNOSIS — I739 Peripheral vascular disease, unspecified: Secondary | ICD-10-CM

## 2014-05-25 DIAGNOSIS — E785 Hyperlipidemia, unspecified: Secondary | ICD-10-CM

## 2014-05-25 DIAGNOSIS — I10 Essential (primary) hypertension: Secondary | ICD-10-CM

## 2014-05-25 DIAGNOSIS — I779 Disorder of arteries and arterioles, unspecified: Secondary | ICD-10-CM

## 2014-05-25 LAB — BASIC METABOLIC PANEL
ANION GAP: 9 (ref 5–15)
BUN: 8 mg/dL (ref 6–23)
CALCIUM: 8.9 mg/dL (ref 8.4–10.5)
CO2: 22 mmol/L (ref 19–32)
Chloride: 105 mmol/L (ref 96–112)
Creatinine, Ser: 0.97 mg/dL (ref 0.50–1.35)
GFR calc Af Amer: >90 mL/min (ref >90)
GFR calc non Af Amer: 89 mL/min — ABNORMAL LOW (ref >90)
GLUCOSE: 177 mg/dL — AB (ref 70–99)
POTASSIUM: 4 mmol/L (ref 3.5–5.1)
Sodium: 136 mmol/L (ref 135–145)

## 2014-05-25 LAB — CBC
HEMATOCRIT: 34.3 % — AB (ref 39.0–52.0)
Hemoglobin: 11.6 g/dL — ABNORMAL LOW (ref 13.0–17.0)
MCH: 30 pg (ref 26.0–34.0)
MCHC: 33.8 g/dL (ref 30.0–36.0)
MCV: 88.6 fL (ref 78.0–100.0)
Platelets: 145 10*3/uL — ABNORMAL LOW (ref 150–400)
RBC: 3.87 MIL/uL — AB (ref 4.22–5.81)
RDW: 12 % (ref 11.5–15.5)
WBC: 4.1 10*3/uL (ref 4.0–10.5)

## 2014-05-25 MED ORDER — ASPIRIN 325 MG PO TBEC: 325.0000 mg | DELAYED_RELEASE_TABLET | Freq: Every day | ORAL | Status: DC

## 2014-05-25 NOTE — Progress Notes (Signed)
Patient Name: Jearld LeschLarry D Torosian Date of Encounter: 05/25/2014     Active Problems:   Claudication   PAD (peripheral artery disease)    SUBJECTIVE  Denies any CP or SOB. Ambulated ok. 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   CURRENT MEDS . aspirin EC  325 mg Oral Daily  . atorvastatin  40 mg Oral QPM  . clopidogrel  75 mg Oral Daily  . lisinopril  5 mg Oral Daily  . pneumococcal 23 valent vaccine  0.5 mL Intramuscular Tomorrow-1000    OBJECTIVE  Filed Vitals:   05/25/14 0000 05/25/14 0015 05/25/14 0050 05/25/14 0400  BP: 148/62 148/62 142/70 152/69  Pulse: 67   58  Temp: 98.8 F (37.1 C)   98.1 F (36.7 C)  TempSrc:    Oral  Resp: 18   20  Height:      Weight:    188 lb 11.4 oz (85.6 kg)  SpO2: 95%   96%    Intake/Output Summary (Last 24 hours) at 05/25/14 0718 Last data filed at 05/25/14 0000  Gross per 24 hour  Intake   2720 ml  Output      2 ml  Net   2718 ml   Filed Weights   05/24/14 0539 05/25/14 0400  Weight: 194 lb (87.998 kg) 188 lb 11.4 oz (85.6 kg)    PHYSICAL EXAM  General: Pleasant, NAD. Neuro: Alert and oriented X 3. Moves all extremities spontaneously. Psych: Normal affect. HEENT:  Normal  Neck: Supple without bruits or JVD. Lungs:  Resp regular and unlabored, CTA. Heart: RRR no s3, s4, or murmurs. L groin cath site stable. Palpable pulse Abdomen: Soft, non-tender, non-distended, BS + x 4.  Extremities: No clubbing, cyanosis or edema. DP/PT/Radials 2+ and equal bilaterally.  Accessory Clinical Findings  CBC  Recent Labs  05/25/14 0453  WBC 4.1  HGB 11.6*  HCT 34.3*  MCV 88.6  PLT 145*   Basic Metabolic Panel  Recent Labs  05/25/14 0453  NA 136  K 4.0  CL 105  CO2 22  GLUCOSE 177*  BUN 8  CREATININE 0.97  CALCIUM 8.9    TELE NSR with HR 50-60s    ECG  No new EKG   Radiology/Studies  No results found.  ASSESSMENT AND PLAN  1. LE PVD  - doppler 03/20/2014 right ABI of  0.58 with an occluded distal right SFA and a left ABI of 0.41 with a high-frequency signal in his left SFA  - LE angiography with directional atherectomy of the left SFA with adjunctive drug-eluting balloon angioplasty 04/30/2014  - s/p LE angiography on 05/[facility]/2014 [facility] athere[facility] and drug eluting ballon angioplasty of mid R SFA. L SFA patent.  - continue DAPT. Discharge today. Will [facility] and followup with Dr. Allyson SabalBerry in 3 wks   2. Carotid artery stenosis  - 80% prox L ICA on angiography 04/30/2014  - pending possibly enrolling in CREST trial. Will discuss further with Dr. Allyson SabalBerry  3. Tobacco abuse   Signed, Azalee CourseMeng, Hao PA-C Pager: [phone]  I have examined the patient and reviewed assessment and plan and discussed with patient.  Agree with above as stated.  No groin hematoma.  2+ DP pulses bilaterally.  He has residual disease and several questions about CREST study.  He was hoping to have atherectomy of his carotid like he had in the SFA.  I explained that the carotid stent  procedure was different and required distal embolic protection. Continue DAPT.  Continue to avoid tobacco.    Miliyah Luper S.

## 2014-05-25 NOTE — Discharge Summary (Signed)
Discharge Summary   Patient ID: Anthony Brock,  MRN: [id number], DOB/AGE: 60/22/1957 60 y.o.  Admit date: 05/24/2014 Discharge date: 05/25/2014  Primary Care Provider: Tonye Pearson Primary Cardiologist: Dr. Allyson Sabal  Discharge Diagnoses Active Problems:   Claudication   PAD (peripheral artery disease)   Allergies No Known Allergies  Procedures  LE angiography 05/24/2014  Final Impression: successful turbo [facility] atherectomy and drug-eluting balloon angioplasty of a short segment total occlusion in the mid right SFA with completion angiography revealing the left SFA to be widely patent. The patient tolerated the procedure well. The 7 [place] destination sheath was exchanged over an 035 wire for a short 7 [place] sheath. The patient left without the lab in stable condition. He is on dual antibiotic therapy. He'll be hydrated overnight. The sheath will be removed once the ACT falls below 170 and pressure held. He'll be discharged home in the morning and obtain follow-up lower extremity arterial Doppler studies in our [facility] office next week. He will see a mid-level provider back in 2-3 weeks on a daily I am in the office. At that time we will also discuss the possibility of enrolling him in the CREST 2 stent arm for his 80% right internal carotid artery stenosis.    Hospital Course  The patient is a 59 year old African-American male who has been followed by Dr. Allyson Sabal for peripheral vascular disease. He had a long standing history of tobacco abuse. He had arterial doppler on 03/20/2014 revealed right ABI of 0.58 with occluded distal right SFA and a left ABI of 0.41 with high-frequency signal in his left SFA.[name] Lower extremity angiography performed on 04/30/2014 during which procedure carotid angiography was also performed. The study demonstrated 80% proximal left internal carotid artery stenosis. He also had short segment bilateral mid SFA occlusion. The left SFA was treated with  directional atherectomy with adjunctive drug-eluting balloon angioplasty. On follow-up, his symptom has significantly improved in his left leg, however he continued to have right-sided claudication.   He underwent lower extremity angiography on 05/24/2014, turbo [facility] atherectomy and drug eluting ballon angioplasty of mid R SFA was performed. Study also showed patent L SFA. He was seen in the morning of 05/25/2014, which time he denies significant chest discomfort or shortness of breath. He has ambulated without significant lower extremity discomfort. His left groin site appears to be stable without significant bleeding or hematoma. He is deemed stable for discharge from cardiology perspective. I have arranged for our scheduler to contact him to set up bilateral lower extremity arterial Doppler in 2 weeks and follow-up with a PA/NP in the clinic in 3 weeks on the same day Dr. Allyson Sabal is also in the clinic.   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 He will discuss with Dr. Allyson Sabal regarding treatment options for his 80% proximal left internal carotid artery stenosis on follow-up. During the meantime, he has been instructed not to do any heavy lifting. I will give him a work note for 2-3 weeks given the residual stenosis in his carotid artery and excessive labor demand from his work.   Discharge Vitals Blood pressure 158/76, pulse 62, temperature 97.3 F (36.3 C), temperature source Oral, resp. rate 18, height  (1.854 m), weight 188 lb 11.4 oz (85.6 kg), SpO2 96 %.  Filed Weights   05/24/14 0539 05/25/14 0400  Weight: 194 lb (87.998 kg) 188 lb 11.4 oz (85.6 kg)    Labs  CBC  Recent Labs  05/25/14 0453  WBC 4.1  HGB 11.6*  HCT 34.3*  MCV 88.6  PLT 145*   Basic Metabolic Panel  Recent Labs  05/25/14 0453  NA 136  K 4.0  CL 105  CO2 22  GLUCOSE 177*  BUN 8  CREATININE 0.97  CALCIUM 8.9    Disposition  Pt is being discharged home today in good  condition.  Follow-up Plans & Appointments      Follow-up Information    Follow up with Runell GessBERRY,JONATHAN J, MD.   Specialty:  Cardiology   Why:  Office will contact you to arrange 2 week lower extremity arterial doppler and then followup with Dr. Hazle CocaBerry's PA. Please call us if you do not hear from us in 2 business days   Contact information:   [address] [place] [address] [phone]       Discharge Medications    Medication List    TAKE these medications        aspirin 325 MG EC tablet  Take 1 tablet (325 mg total) by mouth daily.     atorvastatin 40 MG tablet  Commonly known as:  LIPITOR  Take 1 tablet (40 mg total) by mouth every evening.     clopidogrel 75 MG tablet  Commonly known as:  PLAVIX  Take 1 tablet (75 mg total) by mouth daily.     lisinopril 5 MG tablet  Commonly known as:  PRINIVIL,ZESTRIL  Take 1 tablet (5 mg total) by mouth daily.     LUZU 1 % Crea  Generic drug:  Luliconazole  Apply 1 application topically as needed (athletes foot).        Outstanding Labs/Studies  2 week LE arterial doppler before followup with Dr. Allyson SabalBerry  Duration of Discharge Encounter   Greater than 30 minutes including physician time.  Ramond DialSigned, Meng, Hao PA-C Pager: [phone] 05/25/2014, 8:58 AM   I have examined the patient and reviewed assessment and plan and discussed with patient. Agree with above as stated. No groin hematoma. 2+ DP pulses bilaterally. He has residual disease and several questions about CREST study. He was hoping to have atherectomy of his carotid like he had in the SFA. I explained that the carotid stent procedure was different and required distal embolic protection. Continue DAPT. Continue to avoid tobacco.   Anthony Viviano S.

## 2014-05-25 NOTE — Discharge Instructions (Signed)
No driving for 24 hours. No lifting over 5 lbs for 1 week. No sexual activity for 1 week. Keep procedure site clean & dry. If you notice increased pain, swelling, bleeding or pus, call/return!  You may shower, but no soaking baths/hot tubs/pools for 1 week.  ° ° °

## 2014-05-29 ENCOUNTER — Telehealth: Payer: Self-pay | Admitting: Cardiovascular Disease

## 2014-06-01 ENCOUNTER — Ambulatory Visit (HOSPITAL_COMMUNITY)
Admission: RE | Admit: 2014-06-01 | Discharge: 2014-06-01 | Disposition: A | Discharge status: Home / Self Care | Payer: BLUE CROSS/BLUE SHIELD | Source: Ambulatory Visit | Attending: Cardiology | Admitting: Cardiology

## 2014-06-01 DIAGNOSIS — I739 Peripheral vascular disease, unspecified: Secondary | ICD-10-CM | POA: Insufficient documentation

## 2014-06-01 NOTE — Progress Notes (Signed)
Lower Extremity Arterial Duplex Completed. °Brianna L Mazza,RVT °

## 2014-06-04 NOTE — Telephone Encounter (Signed)
Closed encounter °

## 2014-06-05 ENCOUNTER — Telehealth: Payer: Self-pay | Admitting: Cardiovascular Disease

## 2014-06-05 NOTE — Telephone Encounter (Signed)
Pt was not at home. Spoke to wife. Wife stated that patient was having some numbness and tingling in his left arm and hand when patient called earlier. Wife said about 30-45 mins after that the patent was no longer having this feeling. Pt had a cath on 2/11 and they were wondering if this symptom was related. Advised wife that since MD went in through pt's groin and he did angiography on his lower extremities that it was unlikely the tingling and numbness was related. I advised her if patient has another episode or is concerned about the numbness to contact his PCP. Wife verbalized understanding.

## 2014-06-05 NOTE — Telephone Encounter (Signed)
Pt called in stating that today he started to feel some numbness and his left hand and arm. He would like to know what to do. Please call back  Thanks

## 2014-06-11 ENCOUNTER — Telehealth: Payer: Self-pay | Admitting: Cardiovascular Disease

## 2014-06-11 NOTE — Telephone Encounter (Signed)
Spoke to patient. He will come get note on tomorrow @ 3/1. Will leave note at front desk in envelope with his name.  Will leave copy of letter for Larita FifeLynn in Medical Records to fax to patient's short term disability company.

## 2014-06-11 NOTE — Telephone Encounter (Signed)
Patient said that you have been taking care of all of his short term disability paperwork and records. Asked if you would fax this to them. Letter on your desk-2/29 @5 :40pm.

## 2014-06-11 NOTE — Telephone Encounter (Signed)
Pt is scheduled to go back to work on 06-15-14.He has an appointment on 06-21-14 to see Dr Allyson SabalBerry or his PA.What he needs is a note for work stating that he needs an extension until after he has an appointment on the 10th

## 2014-06-11 NOTE — Telephone Encounter (Signed)
Will have Dr. Allyson SabalBerry review. Will contact patient after he advises.

## 2014-06-11 NOTE — Telephone Encounter (Signed)
Okay to have extended work leave until March 10 when he sees me in the office

## 2014-06-21 ENCOUNTER — Ambulatory Visit (INDEPENDENT_AMBULATORY_CARE_PROVIDER_SITE_OTHER): Payer: BLUE CROSS/BLUE SHIELD | Admitting: Cardiovascular Disease

## 2014-06-21 ENCOUNTER — Encounter: Payer: Self-pay | Admitting: Cardiology

## 2014-06-21 ENCOUNTER — Encounter: Payer: Self-pay | Admitting: Cardiovascular Disease

## 2014-06-21 ENCOUNTER — Ambulatory Visit (INDEPENDENT_AMBULATORY_CARE_PROVIDER_SITE_OTHER): Payer: BLUE CROSS/BLUE SHIELD | Admitting: Cardiology

## 2014-06-21 VITALS — BP 148/70 | HR 74 | Ht 73.0 in | Wt 190.7 lb

## 2014-06-21 VITALS — Ht 73.0 in | Wt 190.0 lb

## 2014-06-21 DIAGNOSIS — I779 Disorder of arteries and arterioles, unspecified: Secondary | ICD-10-CM

## 2014-06-21 DIAGNOSIS — I739 Peripheral vascular disease, unspecified: Secondary | ICD-10-CM

## 2014-06-21 DIAGNOSIS — D689 Coagulation defect, unspecified: Secondary | ICD-10-CM

## 2014-06-21 DIAGNOSIS — Z01818 Encounter for other preprocedural examination: Secondary | ICD-10-CM

## 2014-06-21 DIAGNOSIS — I10 Essential (primary) hypertension: Secondary | ICD-10-CM

## 2014-06-21 NOTE — Assessment & Plan Note (Signed)
History of asymptomatic 80% right internal carotid artery stenosis. The patient wishes to have carotid stenting and does not wish to be part of the Crest 2  randomized trial. We [facility] and proceed with elective right internal carotid artery stenting. The patient is already on dual antiplatelet therapy. He is asymptomatic.

## 2014-06-21 NOTE — Patient Instructions (Signed)
Lower extremity arterial doppler (in 3 months) - During this test, ultrasound is used to evaluate arterial blood flow in the legs. Allow approximately one hour for this exam.   Our office will be in touch with you concerning your Carotid Stent.

## 2014-06-21 NOTE — Assessment & Plan Note (Signed)
s/p PTA to L SFA with drug-eluting balloon angioplasty 04/30/14. b. pending R SFA PTA.

## 2014-06-21 NOTE — Progress Notes (Signed)
    06/21/2014 Anthony LeschLarry D Brock   07/11/1955  [id number]  Primary Physician DOOLITTLE, Harrel LemonOBERT P, MD Primary Cardiologist: Dr Allyson SabalBerry  HPI:  59 y/o s/p Rt SFA PTA 2/11/6. He has residual 80% LICA stenosis. He has been out of work on disability since discharge secondary to his residual ICA stenosis. He is her today to discuss treatment of his ICA stenosis and continued disability with Dr Allyson SabalBerry but was placed on my schedule this am, Dr Allyson SabalBerry not in the office till 3 pm.   Current Outpatient Prescriptions  Medication Sig Dispense Refill  . aspirin EC 325 MG EC tablet Take 1 tablet (325 mg total) by mouth daily.    [name]. atorvastatin (LIPITOR) 40 MG tablet Take 1 tablet (40 mg total) by mouth every evening. 30 tablet 6  . clopidogrel (PLAVIX) 75 MG tablet Take 1 tablet (75 mg total) by mouth daily. 30 tablet 6  . lisinopril (PRINIVIL,ZESTRIL) 5 MG tablet Take 1 tablet (5 mg total) by mouth daily. 30 tablet 6  . LUZU 1 % CREA Apply 1 application topically as needed (athletes foot).      No current facility-administered medications for this visit.    No Known Allergies  History   Social History  . Marital Status: Married    Spouse Name: N/A  . Number of Children: N/A  . Years of Education: N/A   Occupational History  . Not on file.   Social History Main Topics  . Smoking status: Former Smoker -- 0.50 packs/day for 39 years    Types: Cigarettes    Quit date: 04/30/2014  . Smokeless tobacco: Never Used  . Alcohol Use: 3.6 oz/week    6 Cans of beer per week  . Drug Use: Yes     Comment: "might have used some drugs in my 20's"  . Sexual Activity: Yes   Other Topics Concern  . Not on file   Social History Narrative     Review of Systems: General: negative for chills, fever, night sweats or weight changes.  Cardiovascular: negative for chest pain, dyspnea on exertion, edema, orthopnea, palpitations, paroxysmal nocturnal dyspnea or shortness of breath Dermatological: negative for  rash Respiratory: negative for cough or wheezing Urologic: negative for hematuria Abdominal: negative for nausea, vomiting, diarrhea, bright red blood per rectum, melena, or hematemesis Neurologic: negative for visual changes, syncope, or dizziness All other systems reviewed and are otherwise negative except as noted above.    Blood pressure 148/70, pulse 74, height 6\' 1"  (1.854 m), weight 190 lb 11.2 oz (86.501 kg).  General appearance: alert, cooperative and no distress   ASSESSMENT AND PLAN:   PAD (peripheral artery disease) s/p PTA to L SFA with drug-eluting balloon angioplasty 04/30/14. b. pending R SFA PTA.   Carotid arterial disease 80% LICA by PV angio 04/2014   Essential hypertension Controlled    PLAN  I will arrange for him to come back and discuss the above with Dr Allyson SabalBerry this afternoon.  Lupe Handley KPA-C 06/21/2014 11:00 AM

## 2014-06-21 NOTE — Assessment & Plan Note (Signed)
80% LICA by PV angio 04/2014

## 2014-06-21 NOTE — Assessment & Plan Note (Signed)
Controlled.  

## 2014-06-21 NOTE — Assessment & Plan Note (Signed)
Mr. Anthony Brock returns today for follow-up of his recent endovascular procedure recanalizing short segment occlusion right SFA [facility] and drug-eluting balloon. His ABIs and improved bilaterally though he still has a high-frequency signal in his mid right SFA. He has stopped smoking. He is on dual antiplatelet therapy. His claudication has improved. I am concerned about the residual high-frequency signal in his mid right SFA. We will get lower extremity Dopplers on him in 3 months to reevaluate.

## 2014-06-21 NOTE — Progress Notes (Addendum)
Anthony Brock returns for follow-up of his lower extremity revascularization procedure. He was seen by Corine ShelterLuke Kilroy PAC this morning. His claudication has improved as have his ABIs. He has residual high-frequency in his mid right SFA. He wishes to have his right internal carotid artery percutaneously revascularized which I think he would be a good candidate for anatomically. He is already on dual antibiotic therapy and was neurologically asymptomatic. We will attempt to get preauthorization prior to scheduling him.   Runell GessJonathan J. Delitha Elms, M.D., FACP, [facility]ACC, Earl LagosFAHA, [facility]SCAI [facility] [address] [place], [place]  [address]  [phone] 06/21/2014 3:10 PM   Addendum: Mr Gayla Brock has an 80+% proximal Right ICA stenosis. He is neurologically asymptomatic. I've offered him the opportunity to be enrolled in CREST 2 however the patient desires to be revascularized percutaneously and does not wish to have a CEA. Based on the data from CREST 1 and ACT 1 the outcomes are similar and having performed angio his anatomy is suitable for CAS. I support his decision to have elective Right CAS.    Runell GessJonathan J. Jiya Kissinger, M.D., FACP, [facility]FACC, Earl LagosFAHA, [facility]SC[facility] [address] [place], [place]  [address]  [phone] 06/22/2014 12:18 PM

## 2014-06-22 ENCOUNTER — Telehealth: Payer: Self-pay | Admitting: Cardiovascular Disease

## 2014-06-22 NOTE — Telephone Encounter (Signed)
Predetermination for carotid stent faxed  to [employer], Nurse Case manager, SandrCharlotte Crumbia BalesMaureen N, fax#[phone], phone [phone].

## 2014-06-27 ENCOUNTER — Encounter: Payer: Self-pay | Admitting: Cardiovascular Disease

## 2014-06-27 ENCOUNTER — Telehealth: Payer: Self-pay | Admitting: Cardiovascular Disease

## 2014-06-27 NOTE — Telephone Encounter (Signed)
Spoke with patient regarding procedure at [facility]---Tuesday 07/17/14 at 9:30 am.  Arrival time is 7:30 am---NPO after midnight.  Patient voiced is understanding.  I spoke with Louis Johnson(rep) regarding the above procedure.  He stated he will be there.

## 2014-06-29 ENCOUNTER — Telehealth: Payer: Self-pay | Admitting: Cardiovascular Disease

## 2014-06-29 NOTE — Telephone Encounter (Signed)
Pt need to talk to you about his disability,he said [place] talked to [place].

## 2014-06-29 NOTE — Telephone Encounter (Signed)
Spoke with pt, he was released to return to work by dr berry 06-21-14. 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 They suggested to the patient that he remain out of work until his next scheduled procedure on 07-17-14. They also told the patient to call and have us write him out until after the procedure 07-17-14 and he could turn it in for his short term disability. The patient reports he never turned the note in from 06-21-14 stating he could return to work. Patient would like to know if dr berry will write him out of work until after his procedure 07-17-14. Will forward for dr berry's review

## 2014-06-30 NOTE — Telephone Encounter (Signed)
  That is fine with me, we can write him a note for him to be out of work until after his carotid stent

## 2014-06-30 NOTE — Telephone Encounter (Signed)
That is fine with me, we can write him a note to be out of work until after his carotid stent

## 2014-07-02 ENCOUNTER — Encounter: Payer: Self-pay | Admitting: *Deleted

## 2014-07-02 NOTE — Telephone Encounter (Signed)
Pt calling back again,concerning his note to go back to work.

## 2014-07-02 NOTE — Telephone Encounter (Signed)
Letter composed and left at front desk for patient to pick up. Patient aware.

## 2014-07-10 ENCOUNTER — Telehealth: Payer: Self-pay | Admitting: *Deleted

## 2014-07-11 NOTE — Telephone Encounter (Signed)
Pt calling back again. Pt says he is suppose to have lab work and xray today,is it all right to go ahead with that?

## 2014-07-11 NOTE — Telephone Encounter (Signed)
I spoke with patient about disability forms from [employer].  Forms were filled out.  I wrote that we advised patient to stay out of work from 3/10-4/5 due to the physical nature of his job (lifting, pushing, pulling >10lbs) and the upcoming carotid stent.  [facility].  I advised patient to proceed with lab work for upcoming procedure.

## 2014-07-11 NOTE — Telephone Encounter (Signed)
Returning your call from yesterday. If not at this number,please [phone].

## 2014-07-12 LAB — CBC
HEMATOCRIT: 38.4 % — AB (ref 39.0–52.0)
Hemoglobin: 13.3 g/dL (ref 13.0–17.0)
MCH: 30.7 pg (ref 26.0–34.0)
MCHC: 34.6 g/dL (ref 30.0–36.0)
MCV: 88.7 fL (ref 78.0–100.0)
MPV: 10.3 fL (ref 8.6–12.4)
Platelets: 221 10*3/uL (ref 150–400)
RBC: 4.33 MIL/uL (ref 4.22–5.81)
RDW: 12.6 % (ref 11.5–15.5)
WBC: 4 10*3/uL (ref 4.0–10.5)

## 2014-07-12 LAB — BASIC METABOLIC PANEL
BUN: 13 mg/dL (ref 6–23)
CALCIUM: 9.5 mg/dL (ref 8.4–10.5)
CO2: 27 meq/L (ref 19–32)
CREATININE: 1.01 mg/dL (ref 0.50–1.35)
Chloride: 101 mEq/L (ref 96–112)
Glucose, Bld: 132 mg/dL — ABNORMAL HIGH (ref 70–99)
Potassium: 3.9 mEq/L (ref 3.5–5.3)
SODIUM: 136 meq/L (ref 135–145)

## 2014-07-12 LAB — PROTIME-INR
INR: 0.99 (ref <1.50)
PROTHROMBIN TIME: 13.1 s (ref 11.6–15.2)

## 2014-07-12 LAB — APTT: APTT: 28 s (ref 24–37)

## 2014-07-17 ENCOUNTER — Encounter (HOSPITAL_COMMUNITY)
Admission: RE | Disposition: A | Discharge status: Home / Self Care | Payer: BLUE CROSS/BLUE SHIELD | Source: Ambulatory Visit | Attending: Cardiovascular Disease

## 2014-07-17 ENCOUNTER — Ambulatory Visit (HOSPITAL_COMMUNITY)
Admission: RE | Admit: 2014-07-17 | Discharge: 2014-07-18 | Disposition: A | Discharge status: Home / Self Care | Payer: BLUE CROSS/BLUE SHIELD | Source: Ambulatory Visit | Attending: Cardiovascular Disease | Admitting: Cardiovascular Disease

## 2014-07-17 ENCOUNTER — Encounter (HOSPITAL_COMMUNITY): Payer: Self-pay | Admitting: *Deleted

## 2014-07-17 DIAGNOSIS — I739 Peripheral vascular disease, unspecified: Secondary | ICD-10-CM | POA: Diagnosis present

## 2014-07-17 DIAGNOSIS — I6522 Occlusion and stenosis of left carotid artery: Secondary | ICD-10-CM | POA: Diagnosis not present

## 2014-07-17 DIAGNOSIS — I10 Essential (primary) hypertension: Secondary | ICD-10-CM | POA: Insufficient documentation

## 2014-07-17 DIAGNOSIS — E785 Hyperlipidemia, unspecified: Secondary | ICD-10-CM | POA: Insufficient documentation

## 2014-07-17 DIAGNOSIS — Z87891 Personal history of nicotine dependence: Secondary | ICD-10-CM | POA: Diagnosis not present

## 2014-07-17 DIAGNOSIS — I779 Disorder of arteries and arterioles, unspecified: Secondary | ICD-10-CM | POA: Insufficient documentation

## 2014-07-17 HISTORY — PX: CAROTID STENT INSERTION: SHX5505

## 2014-07-17 HISTORY — DX: Presence of other vascular implants and grafts: Z95.828

## 2014-07-17 LAB — POCT ACTIVATED CLOTTING TIME
ACTIVATED CLOTTING TIME: 325 s
Activated Clotting Time: 134 seconds

## 2014-07-17 LAB — MRSA PCR SCREENING: MRSA BY PCR: NEGATIVE

## 2014-07-17 SURGERY — CAROTID STENT INSERTION

## 2014-07-17 MED ORDER — CLOPIDOGREL BISULFATE 75 MG PO TABS: 75.0000 mg | ORAL_TABLET | Freq: Every day | ORAL | Status: DC

## 2014-07-17 MED ORDER — NOREPINEPHRINE BITARTRATE 1 MG/ML IV SOLN
INTRAVENOUS | Status: AC
  Filled 2014-07-17: qty 4

## 2014-07-17 MED ORDER — ASPIRIN EC 325 MG PO TBEC
325.0000 mg | DELAYED_RELEASE_TABLET | Freq: Every day | ORAL | Status: DC
  Administered 2014-07-18: 325 mg via ORAL
  Filled 2014-07-17: qty 1

## 2014-07-17 MED ORDER — SODIUM CHLORIDE 0.9 % IJ SOLN: 3.0000 mL | INTRAMUSCULAR | Status: DC | PRN

## 2014-07-17 MED ORDER — LISINOPRIL 5 MG PO TABS
5.0000 mg | ORAL_TABLET | Freq: Every day | ORAL | Status: DC
  Administered 2014-07-18: 5 mg via ORAL
  Filled 2014-07-17: qty 1

## 2014-07-17 MED ORDER — SODIUM CHLORIDE 0.9 % IV SOLN: 250.0000 mL | INTRAVENOUS | Status: DC | PRN

## 2014-07-17 MED ORDER — SODIUM CHLORIDE 0.9 % IJ SOLN: 3.0000 mL | Freq: Two times a day (BID) | INTRAMUSCULAR | Status: DC

## 2014-07-17 MED ORDER — LIDOCAINE HCL (PF) 1 % IJ SOLN
INTRAMUSCULAR | Status: AC
  Filled 2014-07-17: qty 30

## 2014-07-17 MED ORDER — ATROPINE SULFATE 0.1 MG/ML IJ SOLN
INTRAMUSCULAR | Status: AC
  Filled 2014-07-17: qty 10

## 2014-07-17 MED ORDER — SODIUM CHLORIDE 0.9 % IV SOLN
INTRAVENOUS | Status: AC
Start: 2014-07-17 — End: 2014-07-17
  Administered 2014-07-17: 12:00:00 via INTRAVENOUS

## 2014-07-17 MED ORDER — ASPIRIN EC 325 MG PO TBEC: 325.0000 mg | DELAYED_RELEASE_TABLET | Freq: Every day | ORAL | Status: DC

## 2014-07-17 MED ORDER — ONDANSETRON HCL 4 MG/2ML IJ SOLN: 4.0000 mg | Freq: Four times a day (QID) | INTRAMUSCULAR | Status: DC | PRN

## 2014-07-17 MED ORDER — HEPARIN (PORCINE) IN NACL 2-0.9 UNIT/ML-% IJ SOLN
INTRAMUSCULAR | Status: AC
  Filled 2014-07-17: qty 1000

## 2014-07-17 MED ORDER — CLOPIDOGREL BISULFATE 75 MG PO TABS
75.0000 mg | ORAL_TABLET | Freq: Every day | ORAL | Status: DC
  Administered 2014-07-18: 75 mg via ORAL
  Filled 2014-07-17 (×2): qty 1

## 2014-07-17 MED ORDER — ATORVASTATIN CALCIUM 40 MG PO TABS
40.0000 mg | ORAL_TABLET | Freq: Every evening | ORAL | Status: DC
  Administered 2014-07-17: 40 mg via ORAL
  Filled 2014-07-17 (×2): qty 1

## 2014-07-17 MED ORDER — BIVALIRUDIN 250 MG IV SOLR
INTRAVENOUS | Status: AC
  Filled 2014-07-17: qty 250

## 2014-07-17 MED ORDER — ASPIRIN 81 MG PO CHEW: 81.0000 mg | CHEWABLE_TABLET | ORAL | Status: DC

## 2014-07-17 MED ORDER — ACETAMINOPHEN 325 MG PO TABS
650.0000 mg | ORAL_TABLET | ORAL | Status: DC | PRN
  Administered 2014-07-18: 650 mg via ORAL
  Filled 2014-07-17: qty 2

## 2014-07-17 MED ORDER — SODIUM CHLORIDE 0.9 % IV SOLN
INTRAVENOUS | Status: DC
  Administered 2014-07-17: 08:00:00 via INTRAVENOUS

## 2014-07-17 NOTE — CV Procedure (Signed)
Anthony Brock is a 59 y.o. male    [id number] LOCATION:  FACILITY: [facility]  PHYSICIAN: Anthony Brock, M.D. 04/14/1955   DATE OF PROCEDURE:  07/17/2014  DATE OF DISCHARGE:     PV Angiogram/Intervention    History obtained from chart review.Anthony Brock is a 59 year old married African-American veteran who works at [employer]. He is referred by Dr. Fanny Dance at [facility] for peripheral vascular evaluation because of lifestyle limiting claudication. He has a long history of tobacco abuse smoking three-quarter pack a day for last 30 years. He has never had a heart attack or stroke, and denies chest pain or shortness of breath. He states that he has had left greater than right lower extremity claudication which is lifestyle limiting for several years. Recent Dopplers performed in our office 03/20/14 revealed a right ABI 0.58 with an occluded distal right SFA and a left ABI 0.41 with a high-frequency signal in his left SFA. He underwent angiography and intervention on January 18 revealing a 80% Left  internal carotid artery stenosis, and short segment occlusions in both SFAs. I was able to perform directional atherectomy on his left SFA followed by drug eluding balloon angioplasty. He had an excellent angiographic and clinical result. He ultimately underwent staged right SFA intervention with excellent angiographic and clinical result. He presents today for elective left internal carotid artery stenting for high-grade (80%) asymptomatic left internal carotid artery stenosis.  Operators: Dr. Runell Brock, Dr. Durene Cal   PROCEDURE DESCRIPTION:   The patient was brought to the second floor [facility] lab in the postabsorptive state. He was not premedicated . His right groinwas prepped and shaved in usual sterile fashion. Xylocaine 1% was used  for local anesthesia. A 6 French shuttle  sheath was inserted into the right common femoral artery using standard Seldinger technique. A  JB1 catheter was used to cannulate the left common carotid artery ostium and at 035 Amplatz wire was used to provide the appropriate stiffness to advance the catheter into the common carotid artery and the sheath over the catheter. Once I was comfortable with the position of the sheath in relation to the internal carotid artery takeoff was performed angiography in a right lateral view in addition to intracranial cerebral angiography which will be interpreted by neuro interventional radiology.   HEMODYNAMICS:    AO SYSTOLIC/AO DIASTOLIC: 127/82   Angiographic Data/ procedure description:   The patient received Angiomax bolus with an ACT of 325. Total contrast administered to the patient was 80 mL. Retrograde aortic pressure was monitored during the case. A 6 mm NAV 6 distal protection device was then advanced across the lesion and placed in the distal extracranial internal carotid artery. Following this predilatation was performed with a 3 mm x 2 cm balloon. The patient received a total of 1 mg of atropine during the case. A 7 mm/9 mm x 30 mm long tapered self-expanding Xact nitinol stent was then carefully positioned and deployed across the lesion within the internal carotid artery and postdilated with a 5 mm x 2 cm balloon at nominal pressures resulting reduction of an 80% stenosis to less than 10% residual with excellent apposition. The distal protection device was then captured and withdrawn from the body. The sheath was removed from the internal carotid artery and exchanged over an 035 wire for a short 7 [place] sheath. The patient was neurologically stable and intact at the end of the case and left the lab in stable condition.  IMPRESSION:successful  elective left internal carotid artery stenting using distal protection for an 80% asymptomatic lesion. The patient was on total and type of therapy. He had a Nitinol tapered soft expanding stent successfully deployed with an excellent angiographic and  clinical result. The sheath will be removed in several hours and pressure held. The patient will be hydrated overnight and discharged him in the morning on total and type of therapy. We will get follow-up carotid [facility] next week and he will see me back with the mid-level provider in several weeks in follow-up.     Anthony GessBERRY,Anthony Channing J. MD, [facility] CenterFACC 07/17/2014 11:45 AM

## 2014-07-17 NOTE — H&P (Signed)
  Mr. Gayla DossJoyner returns today for left internal carotid artery PTA and stenting for asymptomatic high-grade stenosis demonstrated by angiography and duplex ultrasound. He will be a observation only patient overnight and discharged home in the morning. He has been on dual antiplatelet therapy.   Runell GessJonathan J. Gifford Ballon, M.D., FACP, [facility] CenterFACC, Earl LagosFAHA, [facility]SC[facility] [address] [place], [place]  [address]  [phone] 07/17/2014 10:13 AM

## 2014-07-17 NOTE — Progress Notes (Signed)
Site area:  Groin a 6 french arterial sheath was removed  Site Prior to Removal:  Level 0  Pressure Applied For 20 MINUTES    Minutes Beginning at 1400  Manual:   Yes.    Patient Status During Pull:  stable  Post Pull Groin Site:  Level 0  Post Pull Instructions Given:  Yes.    Post Pull Pulses Present:  Yes.    Dressing Applied:  Yes.    Comments:  VS remain stable during sheath pull.  Pt denies any discomfort at site at this time.

## 2014-07-17 NOTE — H&P (View-Only) (Signed)
    06/21/2014 Lucus D Hendel   03/20/1960  [id number]  Primary Physician DOOLITTLE, ROBERT P, MD Primary Cardiologist: Dr Berry  HPI:  59 y/o s/p Rt SFA PTA 2/11/6. He has residual 80% LICA stenosis. He has been out of work on disability since discharge secondary to his residual ICA stenosis. He is her today to discuss treatment of his ICA stenosis and continued disability with Dr Berry but was placed on my schedule this am, Dr Berry not in the office till 3 pm.   Current Outpatient Prescriptions  Medication Sig Dispense Refill  . aspirin EC 325 MG EC tablet Take 1 tablet (325 mg total) by mouth daily.    . atorvastatin (LIPITOR) 40 MG tablet Take 1 tablet (40 mg total) by mouth every evening. 30 tablet 6  . clopidogrel (PLAVIX) 75 MG tablet Take 1 tablet (75 mg total) by mouth daily. 30 tablet 6  . lisinopril (PRINIVIL,ZESTRIL) 5 MG tablet Take 1 tablet (5 mg total) by mouth daily. 30 tablet 6  . LUZU 1 % CREA Apply 1 application topically as needed (athletes foot).      No current facility-administered medications for this visit.    No Known Allergies  History   Social History  . Marital Status: Married    Spouse Name: N/A  . Number of Children: N/A  . Years of Education: N/A   Occupational History  . Not on file.   Social History Main Topics  . Smoking status: Former Smoker -- 0.50 packs/day for 39 years    Types: Cigarettes    Quit date: 04/30/2014  . Smokeless tobacco: Never Used  . Alcohol Use: 3.6 oz/week    6 Cans of beer per week  . Drug Use: Yes     Comment: "might have used some drugs in my 20's"  . Sexual Activity: Yes   Other Topics Concern  . Not on file   Social History Narrative     Review of Systems: General: negative for chills, fever, night sweats or weight changes.  Cardiovascular: negative for chest pain, dyspnea on exertion, edema, orthopnea, palpitations, paroxysmal nocturnal dyspnea or shortness of breath Dermatological: negative for  rash Respiratory: negative for cough or wheezing Urologic: negative for hematuria Abdominal: negative for nausea, vomiting, diarrhea, bright red blood per rectum, melena, or hematemesis Neurologic: negative for visual changes, syncope, or dizziness All other systems reviewed and are otherwise negative except as noted above.    Blood pressure 148/70, pulse 74, height 6' 1" (1.854 m), weight 190 lb 11.2 oz (86.501 kg).  General appearance: alert, cooperative and no distress   ASSESSMENT AND PLAN:   PAD (peripheral artery disease) s/p PTA to L SFA with drug-eluting balloon angioplasty 04/30/14. b. pending R SFA PTA.   Carotid arterial disease 80% LICA by PV angio 04/2014   Essential hypertension Controlled    PLAN  I will arrange for him to come back and discuss the above with Dr Berry this afternoon.  Brax Walen KPA-C 06/21/2014 11:00 AM 

## 2014-07-17 NOTE — Progress Notes (Signed)
Neuro assessment-Speech clear and appropriate. Alert, oriented x 3. Smile symmetrical. Upper and lower extremities equal bilaterally in strength.

## 2014-07-17 NOTE — Interval H&P Note (Signed)
History and Physical Interval Note:  07/17/2014 10:12 AM  Anthony Brock  has presented today for surgery, with the diagnosis of carotid stenosis  The various methods of treatment have been discussed with the patient and family. After consideration of risks, benefits and other options for treatment, the patient has consented to  Procedure(s): CAROTID STENT INSERTION (N/A) as a surgical intervention .  The patient's history has been reviewed, patient examined, no change in status, stable for surgery.  I have reviewed the patient's chart and labs.  Questions were answered to the patient's satisfaction.     Runell GessBERRY,[name]

## 2014-07-18 ENCOUNTER — Other Ambulatory Visit: Payer: Self-pay | Admitting: Cardiology

## 2014-07-18 ENCOUNTER — Encounter (HOSPITAL_COMMUNITY): Payer: Self-pay | Admitting: Cardiology

## 2014-07-18 DIAGNOSIS — I739 Peripheral vascular disease, unspecified: Secondary | ICD-10-CM

## 2014-07-18 DIAGNOSIS — I10 Essential (primary) hypertension: Secondary | ICD-10-CM

## 2014-07-18 DIAGNOSIS — I6522 Occlusion and stenosis of left carotid artery: Secondary | ICD-10-CM

## 2014-07-18 DIAGNOSIS — E785 Hyperlipidemia, unspecified: Secondary | ICD-10-CM | POA: Diagnosis present

## 2014-07-18 DIAGNOSIS — Z95828 Presence of other vascular implants and grafts: Secondary | ICD-10-CM

## 2014-07-18 HISTORY — DX: Presence of other vascular implants and grafts: Z95.828

## 2014-07-18 LAB — CBC
HCT: 33.9 % — ABNORMAL LOW (ref 39.0–52.0)
Hemoglobin: 11.6 g/dL — ABNORMAL LOW (ref 13.0–17.0)
MCH: 30.2 pg (ref 26.0–34.0)
MCHC: 34.2 g/dL (ref 30.0–36.0)
MCV: 88.3 fL (ref 78.0–100.0)
Platelets: 186 10*3/uL (ref 150–400)
RBC: 3.84 MIL/uL — ABNORMAL LOW (ref 4.22–5.81)
RDW: 12.3 % (ref 11.5–15.5)
WBC: 4.4 10*3/uL (ref 4.0–10.5)

## 2014-07-18 LAB — BASIC METABOLIC PANEL WITH GFR
Anion gap: 7 (ref 5–15)
BUN: 11 mg/dL (ref 6–23)
CO2: 27 mmol/L (ref 19–32)
Calcium: 9.2 mg/dL (ref 8.4–10.5)
Chloride: 101 mmol/L (ref 96–112)
Creatinine, Ser: 1.04 mg/dL (ref 0.50–1.35)
GFR calc Af Amer: 89 mL/min — ABNORMAL LOW
GFR calc non Af Amer: 77 mL/min — ABNORMAL LOW
Glucose, Bld: 129 mg/dL — ABNORMAL HIGH (ref 70–99)
Potassium: 4 mmol/L (ref 3.5–5.1)
Sodium: 135 mmol/L (ref 135–145)

## 2014-07-18 MED ORDER — ACETAMINOPHEN 325 MG PO TABS: 650.0000 mg | ORAL_TABLET | ORAL | Status: DC | PRN

## 2014-07-18 MED ORDER — ASPIRIN EC 81 MG PO TBEC: 81.0000 mg | DELAYED_RELEASE_TABLET | Freq: Every day | ORAL | Status: AC

## 2014-07-18 MED FILL — Sodium Chloride IV Soln 0.9%: INTRAVENOUS | Qty: 50 | Status: AC

## 2014-07-18 NOTE — Progress Notes (Signed)
   59 y/o s/p Rt SFA PTA 2/11/6. He has residual 80% LICA stenosis. He has been out of work on disability since discharge secondary to his residual ICA stenosis. He presented for planned elective left internal carotid artery stenting for high-grade (80%) asymptomatic left internal carotid artery stenosis. - Drs. Berry & Brabham  Subjective:  No complaints.   Ambulated without difficulty.  Objective:  Vital Signs in the last 24 hours: Temp:  [97.7 F (36.5 C)-98.6 F (37 C)] 97.9 F (36.6 C) (04/06 1155) Pulse Rate:  [47-102] 54 (04/05 1830) Resp:  [16-31] 16 (04/06 1155) BP: (104-164)/(58-86) 145/66 mmHg (04/06 1155) SpO2:  [96 %-100 %] 100 % (04/06 1155) Weight:  [189 lb 6 oz (85.9 kg)] 189 lb 6 oz (85.9 kg) (04/05 1830)  Intake/Output from previous day: 04/05 0701 - 04/06 0700 In: 738.8 [P.O.:240; I.V.:498.8] Out: 550 [Urine:550] Intake/Output from this shift: Total I/O In: 240 [P.O.:240] Out: -   Physical Exam: General appearance: alert, cooperative, appears stated age and no distress Neck: no adenopathy, no JVD and soft L Carotid bruit Lungs: clear to auscultation bilaterally and normal percussion bilaterally Heart: regular rate and rhythm, S1, S2 normal, no murmur, click, rub or gallop and normal apical impulse Abdomen: soft, non-tender; bowel sounds normal; no masses,  no organomegaly Extremities: extremities normal, atraumatic, no cyanosis or edema Pulses: 2+ and symmetric Neurologic: Mental status: Alert, oriented, thought content appropriate Cranial nerves: normal  Lab Results:  Recent Labs  07/18/14 0240  WBC 4.4  HGB 11.6*  PLT 186    Recent Labs  07/18/14 0240  NA 135  K 4.0  CL 101  CO2 27  GLUCOSE 129*  BUN 11  CREATININE 1.04   No results for input(s): TROPONINI in the last 72 hours.  Invalid input(s): CK, MB Hepatic Function Panel  Lab Results  Component Value Date   CHOL 174 04/24/2014   HDL 36* 04/24/2014   LDLCALC 82 04/24/2014    TRIG 281* 04/24/2014   CHOLHDL 4.8 04/24/2014    Imaging: Imaging results have been reviewed  CV Procedure: RFA Access ---> 7 mm/9 mm x 30 mm long tapered self-expanding Xact nitinol stent was then carefully positioned and deployed across the lesion within the internal carotid artery and postdilated with a 5 mm x 2 cm balloon at nominal pressures resulting reduction of an 80% stenosis to less than 10% residual with excellent apposition  Assessment/Plan:  Principal Problem:   Left-sided extracranial carotid artery stenosis Active Problems:   Presence of stent in artery: Left Carotid   Essential hypertension   PAD (peripheral artery disease)   Hyperlipidemia with target LDL less than 70  Stable s/p LICA stent with no adverse clinical symptoms. Groin is stable. Labs are stable. Hemodynamically stable.  He is on Plavix plus aspirin On statin Blood pressure well controlled on ACE inhibitor; with resting heart rate in 50s, would not use beta blocker.  Plan is discharge home today. We will [facility] week and he will see me back with the mid-level provider in several weeks in follow-up.  Melania Kirks W 07/18/2014, 1:20 PM

## 2014-07-18 NOTE — Progress Notes (Signed)
Utilization Review Completed.Dorcas CarrowDowell, Miki Labuda T4/09/2014

## 2014-07-18 NOTE — Discharge Summary (Signed)
Patient ID: Anthony Brock,  MRN: [id number], DOB/AGE: 60/11/1955 60 y.o.  Admit date: 07/17/2014 Discharge date: 07/18/2014  Primary Care Provider: Tonye PearsonOLITTLE, ROBERT P, MD Primary Cardiologist: Dr Allyson Sabalberry  Discharge Diagnoses Principal Problem:   Lt ICA stenosis- Carotid stent placed 07/17/14 Active Problems:   Essential hypertension   PAD (peripheral artery disease)   Hyperlipidemia with target LDL less than 70    Procedures: LICA PTA and stenting 07/17/14   Hospital Course : 59 y/o s/p Rt SFA PTA 2/11/6. He has residual 80% LICA stenosis. He has been out of work on disability since discharge secondary to his residual ICA stenosis. He presented 07/17/14 for planned elective left internal carotid artery stenting for high-grade (80%) asymptomatic left internal carotid artery stenosis. This was done by Dr Allyson SabalBerry without complication. He is stable for discharge 07/18/14. OP dopplers and f/u to be arranged.  Discharge Vitals:  Blood pressure 145/66, pulse 54, temperature 97.9 F (36.6 C), temperature source Oral, resp. rate 16, height 6\' 1"  (1.854 m), weight 189 lb 6 oz (85.9 kg), SpO2 100 %.    Labs: Results for orders placed or performed during the hospital encounter of 07/17/14 (from the past 24 hour(s))  POCT Activated clotting time     Status: None   Collection Time: 07/17/14  1:59 PM  Result Value Ref Range   Activated Clotting Time 134 seconds  MRSA PCR Screening     Status: None   Collection Time: 07/17/14  6:28 PM  Result Value Ref Range   MRSA by PCR NEGATIVE NEGATIVE  Basic metabolic panel      Status: Abnormal   Collection Time: 07/18/14  2:40 AM  Result Value Ref Range   Sodium 135 135 - 145 mmol/L   Potassium 4.0 3.5 - 5.1 mmol/L   Chloride 101 96 - 112 mmol/L   CO2 27 19 - 32 mmol/L   Glucose, Bld 129 (H) 70 - 99 mg/dL   BUN 11 6 - 23 mg/dL   Creatinine, Ser 4.091.04 0.50 - 1.35 mg/dL   Calcium 9.2 8.4 - 81.110.5 mg/dL   GFR calc non Af Amer 77 (L) >90 mL/min   GFR calc  Af Amer 89 (L) >90 mL/min   Anion gap 7 5 - 15  CBC     Status: Abnormal   Collection Time: 07/18/14  2:40 AM  Result Value Ref Range   WBC 4.4 4.0 - 10.5 K/uL   RBC 3.84 (L) 4.22 - 5.81 MIL/uL   Hemoglobin 11.6 (L) 13.0 - 17.0 g/dL   HCT 91.433.9 (L) 78.239.0 - 95.652.0 %   MCV 88.3 78.0 - 100.0 fL   MCH 30.2 26.0 - 34.0 pg   MCHC 34.2 30.0 - 36.0 g/dL   RDW 21.312.3 08.611.5 - 57.815.5 %   Platelets 186 150 - 400 K/uL    Disposition:      Follow-up Information    Follow up with Runell GessBERRY,JONATHAN J, MD.   Specialty:  Cardiology   Why:  office will call you   Contact information:   [address] [place] [address] [phone]       Discharge Medications:    Medication List    TAKE these medications        acetaminophen 325 MG tablet  Commonly known as:  TYLENOL  Take 2 tablets (650 mg total) by mouth every 4 (four) hours as needed for headache or mild pain.     aspirin EC 81 MG tablet  Take 1 tablet (81 mg total) by mouth daily.     atorvastatin 40 MG tablet  Commonly known as:  LIPITOR  Take 1 tablet (40 mg total) by mouth every evening.     clopidogrel 75 MG tablet  Commonly known as:  PLAVIX  Take 1 tablet (75 mg total) by mouth daily.     lisinopril 5 MG tablet  Commonly known as:  PRINIVIL,ZESTRIL  Take 1 tablet (5 mg total) by mouth daily.     LUZU 1 % Crea  Generic drug:  Luliconazole  Apply 1 application topically as needed (athletes foot).         Duration of Discharge Encounter: Greater than 30 minutes including physician time.  Jolene Provost PA-C 07/18/2014 1:51 PM

## 2014-07-18 NOTE — Progress Notes (Signed)
Instructions reviewed with teachback. D/C via w/c to home

## 2014-07-19 ENCOUNTER — Telehealth: Payer: Self-pay | Admitting: Cardiovascular Disease

## 2014-07-19 ENCOUNTER — Encounter: Payer: Self-pay | Admitting: *Deleted

## 2014-07-19 NOTE — Telephone Encounter (Signed)
Call regarding Short Term disability & out-of-work note. Had carotid stent on 5th. Had been verbally OK'd by Dr. Allyson SabalBerry to stay out till 18th of March post-stenting. Checked w/ Samara DeistKathryn, OK to write work note.   Gave pt fax number here for disability provider to send any necessary paperwork.

## 2014-07-19 NOTE — Telephone Encounter (Signed)
Please call,just discharged from the hospital. Pt have some questions he need to ask.

## 2014-07-19 NOTE — Telephone Encounter (Signed)
Called pt, he did not want it mailed, will pick up in person. Note in front desk file, pt informed. He voiced understanding.

## 2014-07-26 ENCOUNTER — Ambulatory Visit (HOSPITAL_COMMUNITY)
Admission: RE | Admit: 2014-07-26 | Discharge: 2014-07-26 | Disposition: A | Discharge status: Home / Self Care | Payer: BLUE CROSS/BLUE SHIELD | Source: Ambulatory Visit | Attending: Cardiology | Admitting: Cardiology

## 2014-07-26 DIAGNOSIS — I6522 Occlusion and stenosis of left carotid artery: Secondary | ICD-10-CM

## 2014-07-26 NOTE — Progress Notes (Signed)
Carotid duplex completed. °Brianna L Mazza,RVT °

## 2014-07-27 ENCOUNTER — Telehealth: Payer: Self-pay | Admitting: Cardiovascular Disease

## 2014-07-27 NOTE — Telephone Encounter (Signed)
Pt called in stating that he fax over a fitness form from [employer] that he needs sign by Dr. Allyson SabalBerry. He gave a fax number this can be sent to [phone]. Once this paperwork has been filled out he would like a call from our office   Thanks

## 2014-07-27 NOTE — Telephone Encounter (Signed)
Forward to [employer]Max[employer]athryn vogel RN

## 2014-07-30 ENCOUNTER — Telehealth: Payer: Self-pay | Admitting: Cardiovascular Disease

## 2014-07-30 NOTE — Telephone Encounter (Signed)
Form is completed and at the front desk awaiting patient pick up.  Patient's wife notified.

## 2014-07-30 NOTE — Telephone Encounter (Signed)
Pt can return to work without limitation  JJB

## 2014-07-30 NOTE — Telephone Encounter (Signed)
Pt is calling in to check the status of his paperwork. Please call  Thanks

## 2014-07-30 NOTE — Telephone Encounter (Signed)
Has this been handled?

## 2014-07-30 NOTE — Telephone Encounter (Signed)
Pt would like for you to fax his form ,to the number that was on the form please.

## 2014-07-30 NOTE — Telephone Encounter (Signed)
Dr Allyson SabalBerry, does Mr Anthony Brock have any restrictions upon returning to work?  Is he cleared to return to work?

## 2014-07-31 NOTE — Telephone Encounter (Signed)
Larita FifeLynn took care of this yesterday.

## 2014-10-09 ENCOUNTER — Telehealth: Payer: Self-pay | Admitting: *Deleted

## 2014-10-09 NOTE — Telephone Encounter (Signed)
Called Anthony Brock for his SAFE-DCB 6 month follow-up phone call. He states he has been doing well and has had no additional procedures nor interventions to his lower extremities. I will follow-up in 6 months.

## 2014-11-14 ENCOUNTER — Telehealth: Payer: Self-pay | Admitting: Cardiovascular Disease

## 2014-11-14 NOTE — Telephone Encounter (Signed)
OK to hold plavix for dental procedure

## 2014-11-14 NOTE — Telephone Encounter (Signed)
Anthony Brock, please advise 

## 2014-11-14 NOTE — Telephone Encounter (Signed)
Returned call to patient no answer.Left message on personal voice mail Dr.Berry out of office this afternoon will send message to him for advice.

## 2014-11-14 NOTE — Telephone Encounter (Signed)
Pt called in stating that he will be going to the dentist on 8/16 to have 5 teeth extracted. He would like to know if it is ok for him to hold his Clopidogrel and if so hold long is he able to hold it. Please f/u with him   Thanks

## 2014-11-15 ENCOUNTER — Telehealth: Payer: Self-pay | Admitting: Cardiovascular Disease

## 2014-11-15 NOTE — Telephone Encounter (Signed)
Returned patient's call --has to leave message for patient to call and schedule lower extremity doppler ordered by Dr. Allyson Sabal.

## 2014-11-15 NOTE — Telephone Encounter (Signed)
Left message for pt to call.

## 2014-11-16 ENCOUNTER — Telehealth: Payer: Self-pay

## 2014-11-16 NOTE — Telephone Encounter (Signed)
I left a message for patient that Plavix will be continued for at least a year, maybe longer.  He is due for carotid dopplers in Oct. 2016

## 2014-11-16 NOTE — Telephone Encounter (Signed)
Instructed patient he could come off Plavix for 5-7 days prior to dental procedure per [facility] RN  Patient would also like to know how long he has to continue Plavix after his carotid stent placement in April.  He is thinking it is for 6 months  He also wondered if he will need another carotid ultrasound.

## 2014-11-16 NOTE — Telephone Encounter (Signed)
Patient returned call:  He said that Dr. Allyson Sabal because of something he saw with his carotid he (patient) was under the impression that he wanted to have the carotid ultrasound earlier than 6 months.  Six months will be October  He also needs a letter for his dentist, Dr. Krista Blue, that it is ok for him to be off the Plavix for his procedure.  Letter drafted to get fax number for his dentist

## 2014-11-18 NOTE — Telephone Encounter (Signed)
Getting carotid doppler 6 months from April (Oct) is fine

## 2014-11-19 ENCOUNTER — Encounter: Payer: Self-pay | Admitting: *Deleted

## 2014-11-19 NOTE — Telephone Encounter (Signed)
I spoke with patient. I explained that the carotid doppler is due in Oct.  He verbalized understanding.  Anthony Brock will call me back with a fax number for his dentist.

## 2014-11-19 NOTE — Telephone Encounter (Signed)
Letter faxed to dentist at [phone]

## 2014-11-20 ENCOUNTER — Telehealth: Payer: Self-pay | Admitting: Cardiovascular Disease

## 2014-11-20 NOTE — Telephone Encounter (Signed)
This message was left on my voicemail yesterday,I was not at work. Pt say he needs authorization to stop his blood thinner for dental work. Please fax to [phone].[name]

## 2014-11-20 NOTE — Telephone Encounter (Signed)
Letter has been sent

## 2015-01-04 ENCOUNTER — Other Ambulatory Visit: Payer: Self-pay | Admitting: Physician Assistant

## 2015-01-04 ENCOUNTER — Other Ambulatory Visit: Payer: Self-pay | Admitting: Family Medicine

## 2015-01-04 NOTE — Telephone Encounter (Signed)
REFILL 

## 2015-02-01 ENCOUNTER — Other Ambulatory Visit: Payer: Self-pay | Admitting: Cardiovascular Disease

## 2015-02-01 DIAGNOSIS — I739 Peripheral vascular disease, unspecified: Secondary | ICD-10-CM

## 2015-02-01 DIAGNOSIS — I6523 Occlusion and stenosis of bilateral carotid arteries: Secondary | ICD-10-CM

## 2015-02-05 ENCOUNTER — Other Ambulatory Visit: Payer: Self-pay

## 2015-02-05 MED ORDER — LISINOPRIL 5 MG PO TABS: 5.0000 mg | ORAL_TABLET | Freq: Every day | ORAL | Status: DC

## 2015-02-07 ENCOUNTER — Ambulatory Visit (HOSPITAL_BASED_OUTPATIENT_CLINIC_OR_DEPARTMENT_OTHER)
Admission: RE | Admit: 2015-02-07 | Discharge: 2015-02-07 | Disposition: A | Discharge status: Home / Self Care | Payer: BLUE CROSS/BLUE SHIELD | Source: Ambulatory Visit | Attending: Cardiovascular Disease | Admitting: Cardiovascular Disease

## 2015-02-07 ENCOUNTER — Ambulatory Visit (HOSPITAL_COMMUNITY)
Admission: RE | Admit: 2015-02-07 | Discharge: 2015-02-07 | Disposition: A | Discharge status: Home / Self Care | Payer: BLUE CROSS/BLUE SHIELD | Source: Ambulatory Visit | Attending: Cardiology | Admitting: Cardiology

## 2015-02-07 ENCOUNTER — Ambulatory Visit (HOSPITAL_BASED_OUTPATIENT_CLINIC_OR_DEPARTMENT_OTHER)
Admission: RE | Admit: 2015-02-07 | Discharge: 2015-02-07 | Disposition: A | Discharge status: Home / Self Care | Payer: BLUE CROSS/BLUE SHIELD | Source: Ambulatory Visit | Attending: Cardiology | Admitting: Cardiology

## 2015-02-07 DIAGNOSIS — I739 Peripheral vascular disease, unspecified: Secondary | ICD-10-CM

## 2015-02-07 DIAGNOSIS — I6523 Occlusion and stenosis of bilateral carotid arteries: Secondary | ICD-10-CM | POA: Diagnosis not present

## 2015-02-07 DIAGNOSIS — I70203 Unspecified atherosclerosis of native arteries of extremities, bilateral legs: Secondary | ICD-10-CM | POA: Diagnosis not present

## 2015-02-07 DIAGNOSIS — E785 Hyperlipidemia, unspecified: Secondary | ICD-10-CM | POA: Diagnosis not present

## 2015-02-07 DIAGNOSIS — I10 Essential (primary) hypertension: Secondary | ICD-10-CM | POA: Insufficient documentation

## 2015-02-12 ENCOUNTER — Encounter: Payer: Self-pay | Admitting: Cardiovascular Disease

## 2015-02-12 ENCOUNTER — Ambulatory Visit (INDEPENDENT_AMBULATORY_CARE_PROVIDER_SITE_OTHER): Payer: BLUE CROSS/BLUE SHIELD | Admitting: Cardiovascular Disease

## 2015-02-12 ENCOUNTER — Telehealth: Payer: Self-pay | Admitting: Cardiovascular Disease

## 2015-02-12 VITALS — BP 130/68 | HR 65 | Ht 73.0 in | Wt 188.0 lb

## 2015-02-12 DIAGNOSIS — I10 Essential (primary) hypertension: Secondary | ICD-10-CM | POA: Diagnosis not present

## 2015-02-12 DIAGNOSIS — E785 Hyperlipidemia, unspecified: Secondary | ICD-10-CM

## 2015-02-12 DIAGNOSIS — Z95828 Presence of other vascular implants and grafts: Secondary | ICD-10-CM

## 2015-02-12 DIAGNOSIS — Z789 Other specified health status: Secondary | ICD-10-CM | POA: Diagnosis not present

## 2015-02-12 DIAGNOSIS — Z01818 Encounter for other preprocedural examination: Secondary | ICD-10-CM

## 2015-02-12 DIAGNOSIS — I739 Peripheral vascular disease, unspecified: Secondary | ICD-10-CM

## 2015-02-12 LAB — CBC WITH DIFFERENTIAL/PLATELET
Basophils Absolute: 0 10*3/uL (ref 0.0–0.1)
Basophils Relative: 0 % (ref 0–1)
Eosinophils Absolute: 0 10*3/uL (ref 0.0–0.7)
Eosinophils Relative: 1 % (ref 0–5)
HEMATOCRIT: 40.8 % (ref 39.0–52.0)
Hemoglobin: 14.3 g/dL (ref 13.0–17.0)
Lymphocytes Relative: 48 % — ABNORMAL HIGH (ref 12–46)
Lymphs Abs: 2.1 10*3/uL (ref 0.7–4.0)
MCH: 30.4 pg (ref 26.0–34.0)
MCHC: 35 g/dL (ref 30.0–36.0)
MCV: 86.8 fL (ref 78.0–100.0)
MONO ABS: 0.5 10*3/uL (ref 0.1–1.0)
MONOS PCT: 11 % (ref 3–12)
MPV: 10 fL (ref 8.6–12.4)
NEUTROS ABS: 1.8 10*3/uL (ref 1.7–7.7)
NEUTROS PCT: 40 % — AB (ref 43–77)
Platelets: 208 10*3/uL (ref 150–400)
RBC: 4.7 MIL/uL (ref 4.22–5.81)
RDW: 12.3 % (ref 11.5–15.5)
WBC: 4.4 10*3/uL (ref 4.0–10.5)

## 2015-02-12 LAB — BASIC METABOLIC PANEL
BUN: 13 mg/dL (ref 7–25)
CO2: 28 mmol/L (ref 20–31)
Calcium: 9.8 mg/dL (ref 8.6–10.3)
Chloride: 101 mmol/L (ref 98–110)
Creat: 1.23 mg/dL (ref 0.70–1.25)
GLUCOSE: 90 mg/dL (ref 65–99)
POTASSIUM: 4.7 mmol/L (ref 3.5–5.3)
SODIUM: 137 mmol/L (ref 135–146)

## 2015-02-12 MED ORDER — VARENICLINE TARTRATE 0.5 MG X 11 & 1 MG X 42 PO MISC: ORAL | Status: DC

## 2015-02-12 MED ORDER — VARENICLINE TARTRATE 1 MG PO TABS: 1.0000 mg | ORAL_TABLET | Freq: Two times a day (BID) | ORAL | Status: DC

## 2015-02-12 NOTE — Assessment & Plan Note (Signed)
Continued tobacco abuse recalcitrant to risk factor modification 

## 2015-02-12 NOTE — Assessment & Plan Note (Signed)
History of hypertension blood pressure measured 130/68. He is on lisinopril. Continue current meds at current dosing

## 2015-02-12 NOTE — Telephone Encounter (Signed)
Wife walked in office.She stated husband saw Dr.Berry this morning,she was concerned that he was scheduled for pv procedure next week.She wanted to know the urgency.Advised procedures are scheduled depending on Drs hospital schedule.She stated husband is still smoking and he needs chantix prescription he never had filled from before.Chantix prescription sent to pharmacy.

## 2015-02-12 NOTE — Telephone Encounter (Signed)
Want to know if he needs to not take his medication(Aspirin , Lipitor and Plavix)  for a procedure that he is having done on Thursday (lmsg if he does not pick up )  .[name].Please call   Thanks

## 2015-02-12 NOTE — Assessment & Plan Note (Signed)
History of hyperlipidemia on atorvastatin 40 mg a day with recent lipid profile performed 04/24/14 revealed a total cholesterol 174, LDL 82 and HDL of 36

## 2015-02-12 NOTE — Progress Notes (Signed)
   02/12/2015 Emrick D Stanfill   12/14/1955  [id number]  Primary Physician DOOLITTLE, ROBERT P, MD Primary Cardiologist: Annely Sliva J. Akhilesh Sassone MD FACP,FACC,FAHA, FSCAI   HPI:  Mr. Anthony Brock is a 59-year-old married African-American veteran who works at [employer]. He was referred by Dr. Jah at [facility] for peripheral vascular evaluation because of lifestyle limiting claudication. last time I saw him was at the time of his carotid stenting 07/17/14. He has a long history of tobacco abuse smoking three-quarter pack a day for last 30 years. He has never had a heart attack or stroke, and denies chest pain or shortness of breath. He states that he has had left greater than right lower extremity claudication which is lifestyle limiting for several years. Recent Dopplers performed in our office 03/20/14 revealed a right ABI 0.58 with an occluded distal right SFA and a left ABI 0.41 with a high-frequency signal in his left SFA. He underwent angiography and intervention on January 18 revealing a 80% Left internal carotid artery stenosis, and short segment occlusions in both SFAs. I was able to perform directional atherectomy on his left SFA followed by drug eluding balloon angioplasty. He had an excellent angiographic and clinical result. He ultimately underwent staged right SFA intervention with excellent angiographic and clinical result. He underwent elective left internal carotid artery stenting for high-grade (80%) asymptomatic left internal carotid artery stenosis 07/17/14. His recent carotid Dopplers performed 02/07/15 reveals to be widely patent. He does have recurrent claudication with Dopplers performed 02/07/15 revealing high-grade distal bilateral SFA restenosis.   Current Outpatient Prescriptions  Medication Sig Dispense Refill  . aspirin EC 81 MG tablet Take 1 tablet (81 mg total) by mouth daily.    . atorvastatin (LIPITOR) 40 MG tablet Take 1 tablet (40 mg total) by mouth every evening. 30 tablet  6  . clopidogrel (PLAVIX) 75 MG tablet Take 1 tablet (75 mg total) by mouth daily. NEE OV. 30 tablet 0  . lisinopril (PRINIVIL,ZESTRIL) 5 MG tablet Take 1 tablet (5 mg total) by mouth daily. NEED OV. 30 tablet 1  . LUZU 1 % CREA Apply 1 application topically as needed (athletes foot).      No current facility-administered medications for this visit.    No Known Allergies  Social History   Social History  . Marital Status: Married    Spouse Name: N/A  . Number of Children: N/A  . Years of Education: N/A   Occupational History  . Not on file.   Social History Main Topics  . Smoking status: Former Smoker -- 0.50 packs/day for 39 years    Types: Cigarettes    Quit date: 04/30/2014  . Smokeless tobacco: Never Used  . Alcohol Use: 3.6 oz/week    6 Cans of beer per week  . Drug Use: Yes     Comment: "might have used some drugs in my 20's"  . Sexual Activity: Yes   Other Topics Concern  . Not on file   Social History Narrative     Review of Systems: General: negative for chills, fever, night sweats or weight changes.  Cardiovascular: negative for chest pain, dyspnea on exertion, edema, orthopnea, palpitations, paroxysmal nocturnal dyspnea or shortness of breath Dermatological: negative for rash Respiratory: negative for cough or wheezing Urologic: negative for hematuria Abdominal: negative for nausea, vomiting, diarrhea, bright red blood per rectum, melena, or hematemesis Neurologic: negative for visual changes, syncope, or dizziness All other systems reviewed and are otherwise negative except as noted above.      Blood pressure 130/68, pulse 65, height 6\' 1"  (1.854 m), weight 188 lb (85.276 kg).  General appearance: alert and no distress Neck: no adenopathy, no carotid bruit, no JVD, supple, symmetrical, trachea midline and thyroid not enlarged, symmetric, no tenderness/mass/nodules Lungs: clear to auscultation bilaterally Heart: regular rate and rhythm, S1, S2 normal, no  murmur, click, rub or gallop Extremities: extremities normal, atraumatic, no cyanosis or edema  EKG normal sinus rhythm at 65 without ST or T-wave changes. I personally reviewed this EKG  ASSESSMENT AND PLAN:   Tobacco abuse Continued tobacco abuse recalcitrant to risk factor modification  Presence of stent in L Internal Carotid History of carotid artery disease status post left internal carotid artery stenting by myself 07/17/14. Recent carotid Dopplers performed 02/07/15 revealed this to be widely patent. He is neurologically asymptomatic on dual antiplatelet therapy.  PAD (peripheral artery disease) History of peripheral arterial disease status post staged bilateral SFA intervention 04/30/14 on the left followed by 05/24/14 on the right. He had recurrent symptoms beginning several months ago with recent Dopplers that showed high-grade disease in both distal SFAs. We will plan on performing angiography and renal intervention.  Hyperlipidemia with target LDL less than 70 History of hyperlipidemia on atorvastatin 40 mg a day with recent lipid profile performed 04/24/14 revealed a total cholesterol 174, LDL 82 and HDL of 36  Essential hypertension History of hypertension blood pressure measured 130/68. He is on lisinopril. Continue current meds at current dosing      Runell GessJonathan J. Holmes Hays MD [facility] LtdFACP,FACC,FAHA, [facility] Point[facility]SCAI 02/12/2015 10:16 AM

## 2015-02-12 NOTE — Assessment & Plan Note (Signed)
History of carotid artery disease status post left internal carotid artery stenting by myself 07/17/14. Recent carotid Dopplers performed 02/07/15 revealed this to be widely patent. He is neurologically asymptomatic on dual antiplatelet therapy.

## 2015-02-12 NOTE — Telephone Encounter (Signed)
Pt advised not to hold plavix, aspirin, or lipitor prior to procedure--see letter given pt 02/12/15.

## 2015-02-12 NOTE — Patient Instructions (Addendum)
Medication Instructions:  Your physician recommends that you continue on your current medications as directed. Please refer to the Current Medication list given to you today.   Labwork: Your physician recommends that you return for lab work in: TODAY (BMET, CBC, PT/INR/PTT) The lab can be found on the FIRST FLOOR of out building in [address]   Testing/Procedures: Dr. Allyson SabalBerry has [facility]  This procedure is going to look at the bloodflow in your lower extremities.  If Dr. Allyson SabalBerry is able to open up the arteries, you will have to spend one night in the hospital.  If he is not able to open the arteries, you will be able to go home that same day.  SCHEDULE Thursday 11/3  After the procedure, you will not be allowed to drive for 3 days or push, pull, or lift anything greater than 10 lbs for one week.    You will be required to have the following tests prior to the procedure:  1. Blood work-the blood work can be done no more than 7 days prior to the procedure.  It can be done at any [facility] lab.  There is one downstairs on the first floor of this building and one in the [facility] building [phone]([address])   *REPS: Scott  Puncture site Left Groin    Any Other Special Instructions Will Be Listed Below (If Applicable).     If you need a refill on your cardiac medications before your next appointment, please call your pharmacy.

## 2015-02-12 NOTE — Assessment & Plan Note (Signed)
History of peripheral arterial disease status post staged bilateral SFA intervention 04/30/14 on the left followed by 05/24/14 on the right. He had recurrent symptoms beginning several months ago with recent Dopplers that showed high-grade disease in both distal SFAs. We will plan on performing angiography and renal intervention.

## 2015-02-13 ENCOUNTER — Other Ambulatory Visit: Payer: Self-pay

## 2015-02-13 DIAGNOSIS — I739 Peripheral vascular disease, unspecified: Secondary | ICD-10-CM

## 2015-02-13 LAB — PROTIME-INR
INR: 0.94 (ref <1.50)
Prothrombin Time: 12.7 seconds (ref 11.6–15.2)

## 2015-02-13 LAB — APTT: aPTT: 27 seconds (ref 24–37)

## 2015-02-14 ENCOUNTER — Encounter (HOSPITAL_COMMUNITY): Payer: Self-pay | Admitting: General Practice

## 2015-02-14 ENCOUNTER — Ambulatory Visit (HOSPITAL_COMMUNITY)
Admission: RE | Admit: 2015-02-14 | Discharge: 2015-02-15 | Disposition: A | Discharge status: Home / Self Care | Payer: BLUE CROSS/BLUE SHIELD | Source: Ambulatory Visit | Attending: Cardiovascular Disease | Admitting: Cardiovascular Disease

## 2015-02-14 ENCOUNTER — Encounter (HOSPITAL_COMMUNITY)
Admission: RE | Disposition: A | Discharge status: Home / Self Care | Payer: Self-pay | Source: Ambulatory Visit | Attending: Cardiovascular Disease

## 2015-02-14 DIAGNOSIS — Z23 Encounter for immunization: Secondary | ICD-10-CM | POA: Insufficient documentation

## 2015-02-14 DIAGNOSIS — F17210 Nicotine dependence, cigarettes, uncomplicated: Secondary | ICD-10-CM | POA: Insufficient documentation

## 2015-02-14 DIAGNOSIS — I70213 Atherosclerosis of native arteries of extremities with intermittent claudication, bilateral legs: Secondary | ICD-10-CM | POA: Diagnosis not present

## 2015-02-14 DIAGNOSIS — I10 Essential (primary) hypertension: Secondary | ICD-10-CM | POA: Insufficient documentation

## 2015-02-14 DIAGNOSIS — I739 Peripheral vascular disease, unspecified: Secondary | ICD-10-CM

## 2015-02-14 DIAGNOSIS — I70211 Atherosclerosis of native arteries of extremities with intermittent claudication, right leg: Secondary | ICD-10-CM | POA: Diagnosis not present

## 2015-02-14 HISTORY — PX: LOWER EXTREMITY ANGIOGRAM: SHX5955

## 2015-02-14 HISTORY — PX: PERIPHERAL VASCULAR CATHETERIZATION: SHX172C

## 2015-02-14 LAB — POCT ACTIVATED CLOTTING TIME
ACTIVATED CLOTTING TIME: 208 s
ACTIVATED CLOTTING TIME: 165 s
Activated Clotting Time: 233 seconds
Activated Clotting Time: 251 seconds

## 2015-02-14 SURGERY — LOWER EXTREMITY ANGIOGRAPHY
Laterality: Right

## 2015-02-14 MED ORDER — FENTANYL CITRATE (PF) 100 MCG/2ML IJ SOLN
INTRAMUSCULAR | Status: DC | PRN
  Administered 2015-02-14 (×2): 25 ug via INTRAVENOUS

## 2015-02-14 MED ORDER — ANGIOPLASTY BOOK
Freq: Once | Status: AC
  Administered 2015-02-14: 21:00:00
  Filled 2015-02-14: qty 1

## 2015-02-14 MED ORDER — ASPIRIN 81 MG PO CHEW: 81.0000 mg | CHEWABLE_TABLET | ORAL | Status: DC

## 2015-02-14 MED ORDER — HEPARIN SODIUM (PORCINE) 1000 UNIT/ML IJ SOLN
INTRAMUSCULAR | Status: AC
  Filled 2015-02-14: qty 1

## 2015-02-14 MED ORDER — INFLUENZA VAC SPLIT QUAD 0.5 ML IM SUSY
0.5000 mL | PREFILLED_SYRINGE | INTRAMUSCULAR | Status: AC
  Administered 2015-02-15: 0.5 mL via INTRAMUSCULAR
  Filled 2015-02-14: qty 0.5

## 2015-02-14 MED ORDER — ASPIRIN EC 81 MG PO TBEC
81.0000 mg | DELAYED_RELEASE_TABLET | Freq: Every day | ORAL | Status: DC
  Administered 2015-02-15: 81 mg via ORAL
  Filled 2015-02-14: qty 1

## 2015-02-14 MED ORDER — ATORVASTATIN CALCIUM 40 MG PO TABS
40.0000 mg | ORAL_TABLET | Freq: Every evening | ORAL | Status: DC
  Administered 2015-02-14: 40 mg via ORAL
  Filled 2015-02-14: qty 1

## 2015-02-14 MED ORDER — MORPHINE SULFATE (PF) 2 MG/ML IV SOLN: 2.0000 mg | INTRAVENOUS | Status: DC | PRN

## 2015-02-14 MED ORDER — LIDOCAINE HCL (PF) 1 % IJ SOLN
INTRAMUSCULAR | Status: AC
  Filled 2015-02-14: qty 30

## 2015-02-14 MED ORDER — HEPARIN (PORCINE) IN NACL 2-0.9 UNIT/ML-% IJ SOLN
INTRAMUSCULAR | Status: AC
  Filled 2015-02-14: qty 1000

## 2015-02-14 MED ORDER — SODIUM CHLORIDE 0.9 % WEIGHT BASED INFUSION
3.0000 mL/kg/h | INTRAVENOUS | Status: DC
  Administered 2015-02-14: 3 mL/kg/h via INTRAVENOUS

## 2015-02-14 MED ORDER — HEPARIN SODIUM (PORCINE) 1000 UNIT/ML IJ SOLN
INTRAMUSCULAR | Status: DC | PRN
  Administered 2015-02-14: 4000 [IU] via INTRAVENOUS
  Administered 2015-02-14: 6000 [IU] via INTRAVENOUS

## 2015-02-14 MED ORDER — FENTANYL CITRATE (PF) 100 MCG/2ML IJ SOLN
INTRAMUSCULAR | Status: AC
  Filled 2015-02-14: qty 4

## 2015-02-14 MED ORDER — SODIUM CHLORIDE 0.9 % IJ SOLN: 3.0000 mL | INTRAMUSCULAR | Status: DC | PRN

## 2015-02-14 MED ORDER — SODIUM CHLORIDE 0.9 % WEIGHT BASED INFUSION: 1.0000 mL/kg/h | INTRAVENOUS | Status: DC

## 2015-02-14 MED ORDER — ATROPINE SULFATE 0.1 MG/ML IJ SOLN
INTRAMUSCULAR | Status: AC
  Filled 2015-02-14: qty 10

## 2015-02-14 MED ORDER — ACETAMINOPHEN 325 MG PO TABS: 650.0000 mg | ORAL_TABLET | ORAL | Status: DC | PRN

## 2015-02-14 MED ORDER — LIDOCAINE HCL (PF) 1 % IJ SOLN
INTRAMUSCULAR | Status: DC | PRN
  Administered 2015-02-14: 25 mL

## 2015-02-14 MED ORDER — SODIUM CHLORIDE 0.9 % IV SOLN
INTRAVENOUS | Status: AC
  Administered 2015-02-14: 15:00:00 via INTRAVENOUS

## 2015-02-14 MED ORDER — CLOPIDOGREL BISULFATE 75 MG PO TABS
75.0000 mg | ORAL_TABLET | Freq: Every day | ORAL | Status: DC
  Administered 2015-02-15: 10:00:00 75 mg via ORAL
  Filled 2015-02-14: qty 1

## 2015-02-14 MED ORDER — ONDANSETRON HCL 4 MG/2ML IJ SOLN: 4.0000 mg | Freq: Four times a day (QID) | INTRAMUSCULAR | Status: DC | PRN

## 2015-02-14 MED ORDER — MIDAZOLAM HCL 2 MG/2ML IJ SOLN
INTRAMUSCULAR | Status: AC
  Filled 2015-02-14: qty 4

## 2015-02-14 MED ORDER — MIDAZOLAM HCL 2 MG/2ML IJ SOLN
INTRAMUSCULAR | Status: DC | PRN
  Administered 2015-02-14 (×2): 1 mg via INTRAVENOUS

## 2015-02-14 MED ORDER — LISINOPRIL 5 MG PO TABS
5.0000 mg | ORAL_TABLET | Freq: Every day | ORAL | Status: DC
  Administered 2015-02-15: 10:00:00 5 mg via ORAL
  Filled 2015-02-14: qty 1

## 2015-02-14 SURGICAL SUPPLY — 24 items
BALLN LUTONIX DCB 5X100X130 (BALLOONS) ×4
BALLOON LUTONIX DCB 5X100X130 (BALLOONS) ×2 IMPLANT
CATH ANGIO 5F PIGTAIL 65CM (CATHETERS) ×4 IMPLANT
CATH CROSS OVER TEMPO 5F (CATHETERS) ×4 IMPLANT
CATH HAWKONE LX EXTENDED TIP (CATHETERS) ×4 IMPLANT
COVER PRB 48X5XTLSCP FOLD TPE (BAG) ×2 IMPLANT
COVER PROBE 5X48 (BAG) ×2
DEVICE SPIDERFX EMB PROT 6MM (WIRE) ×4 IMPLANT
GUIDEWIRE ANGLED .035X150CM (WIRE) ×4 IMPLANT
KIT ENCORE 26 ADVANTAGE (KITS) ×4 IMPLANT
KIT PV (KITS) ×4 IMPLANT
SHEATH HIGHFLEX ANSEL 7FR 55CM (SHEATH) ×4 IMPLANT
SHEATH PINNACLE 5F 10CM (SHEATH) ×4 IMPLANT
SHEATH PINNACLE 7F 10CM (SHEATH) ×4 IMPLANT
SHEATH PINNACLE MP 7F 45CM (SHEATH) ×4 IMPLANT
STOPCOCK MORSE 400PSI 3WAY (MISCELLANEOUS) ×4 IMPLANT
SYRINGE MEDRAD AVANTA MACH 7 (SYRINGE) ×4 IMPLANT
TAPE RADIOPAQUE TURBO (MISCELLANEOUS) ×4 IMPLANT
TRANSDUCER W/STOPCOCK (MISCELLANEOUS) ×4 IMPLANT
TRAY PV CATH (CUSTOM PROCEDURE TRAY) ×4 IMPLANT
TUBING CIL FLEX 10 FLL-RA (TUBING) ×4 IMPLANT
WIRE HITORQ VERSACORE ST 145CM (WIRE) ×4 IMPLANT
WIRE ROSEN-J .035X180CM (WIRE) ×4 IMPLANT
WIRE SPARTACORE .014X300CM (WIRE) ×4 IMPLANT

## 2015-02-14 NOTE — Progress Notes (Signed)
Site area: left groin  Site Prior to Removal:  Level 0  Pressure Applied For 20 MINUTES    Minutes Beginning at 1650  Manual:   Yes.    Patient Status During Pull:  AAO X4  Post Pull Groin Site:  Level 0  Post Pull Instructions Given:  Yes.    Post Pull Pulses Present:  Yes.    Dressing Applied:  Yes.    Comments:  Tolerated procedure well

## 2015-02-15 ENCOUNTER — Encounter (HOSPITAL_COMMUNITY): Payer: Self-pay | Admitting: Cardiovascular Disease

## 2015-02-15 DIAGNOSIS — I70213 Atherosclerosis of native arteries of extremities with intermittent claudication, bilateral legs: Secondary | ICD-10-CM | POA: Diagnosis not present

## 2015-02-15 DIAGNOSIS — I739 Peripheral vascular disease, unspecified: Secondary | ICD-10-CM

## 2015-02-15 LAB — BASIC METABOLIC PANEL
ANION GAP: 7 (ref 5–15)
BUN: 11 mg/dL (ref 6–20)
CALCIUM: 8.9 mg/dL (ref 8.9–10.3)
CO2: 26 mmol/L (ref 22–32)
CREATININE: 1.12 mg/dL (ref 0.61–1.24)
Chloride: 103 mmol/L (ref 101–111)
Glucose, Bld: 145 mg/dL — ABNORMAL HIGH (ref 65–99)
Potassium: 4 mmol/L (ref 3.5–5.1)
SODIUM: 136 mmol/L (ref 135–145)

## 2015-02-15 LAB — CBC
HCT: 35.6 % — ABNORMAL LOW (ref 39.0–52.0)
Hemoglobin: 12.4 g/dL — ABNORMAL LOW (ref 13.0–17.0)
MCH: 30.5 pg (ref 26.0–34.0)
MCHC: 34.8 g/dL (ref 30.0–36.0)
MCV: 87.5 fL (ref 78.0–100.0)
PLATELETS: 170 10*3/uL (ref 150–400)
RBC: 4.07 MIL/uL — AB (ref 4.22–5.81)
RDW: 12 % (ref 11.5–15.5)
WBC: 5.2 10*3/uL (ref 4.0–10.5)

## 2015-02-15 NOTE — Discharge Summary (Signed)
Physician Discharge Summary    Cardiologist:  Gwenlyn Found  Patient ID: Anthony Brock MRN: [id number] DOB/AGE: 1955/09/25 59 y.o.  Admit date: 02/14/2015 Discharge date: 02/15/2015  Admission Diagnoses:  Claudication, PAD  Discharge Diagnoses:  Active Problems:   Claudication [facility])   PAD (peripheral artery disease) ([place])   tobacco abuse  Discharged Condition: stable  Hospital Course:   Anthony Brock is a 59 year old married African-American veteran who works at [employer]. He was referred by Dr. Melony Overly at [facility] for peripheral vascular evaluation because of lifestyle limiting claudication. last time I saw him was at the time of his carotid stenting 07/17/14. He has a long history of tobacco abuse smoking three-quarter pack a day for last 30 years. He has never had a heart attack or stroke, and denies chest pain or shortness of breath. He states that he has had left greater than right lower extremity claudication which is lifestyle limiting for several years. Recent Dopplers performed in our office 03/20/14 revealed a right ABI 0.58 with an occluded distal right SFA and a left ABI 0.41 with a high-frequency signal in his left SFA. He underwent angiography and intervention on January 18 revealing a 80% Left internal carotid artery stenosis, and short segment occlusions in both SFAs. I was able to perform directional atherectomy on his left SFA followed by drug eluding balloon angioplasty. He had an excellent angiographic and clinical result. He ultimately underwent staged right SFA intervention with excellent angiographic and clinical result. He underwent elective left internal carotid artery stenting for high-grade (80%) asymptomatic left internal carotid artery stenosis 07/17/14. His recent carotid Dopplers performed 02/07/15 reveals to be widely patent. He does have recurrent claudication with Dopplers performed 02/07/15 revealing high-grade distal bilateral SFA restenosis.  Patient was admitted  for peripheral angiogram which revealed high-grade bilateral SFA restenosis with lifestyle limiting claudication. He underwent successful Hawk 1 partial arthrectomy, drug eluding balloon angioplasty of high-grade mid right SFA "restenosis" using spider distal protection with an excellent intravascular result. ASA, plavix. He will return for staged left SFA intervention. FU next week to plan the next procedure for the week of 11/14. Given lisinopril now for BP.Tobacco cessation advised. The patient was seen by Dr. Marlou Porch who felt he was stable for DC home.   Consults: None  Significant Diagnostic Studies:   PV Angiogram/Intervention  Procedures Performed: 1. Abdominal aortogram/bilateral iliac angiogram/bifemoral runoff 2. Contralateral access 3. Hawk 1 directional atherectomy using spider distal protection mid right SFA 4. Drug eluting balloon angioplasty mid right SFA  PROCEDURE DESCRIPTION:   The patient was brought to the second floor [place] [facility] lab in the the postabsorptive state. He was premedicated with Valium 5 mg by mouth, IV Versed and fentanyl. His left groin was prepped and shaved in usual sterile fashion. Xylocaine 1% was used for local anesthesia. A 5 French sheath was inserted into the left common femoral artery using standard Seldinger technique. A 5 French pigtail catheter was placed in the distal dominant aorta. Distal abdominal aortography, bilateral iliac angiography with bifemoral runoff was performed using bolus chase digital subtraction step table technique. Visipaque dye was used for the entirety of the case. Retrograde aortic pressure was monitored during the case.   Angiographic Data:   1: Left lower extremity-tandem 95% stenoses in the mid left SFA with two-vessel runoff 2: Right lower extremity-99% mid right SFA stenosis with two-vessel runoff  IMPRESSION:Anthony Brock has high-grade bilateral  SFA restenosis with lifestyle limiting claudication. We will proceed with heart  one directional atherectomy followed by drug eluting balloon angioplasty of the right SFA which is his more symptomatic leg  Procedure Description:contralateral access was obtained with a crossover catheter, Glidewire, Rosen wire and a 7 [place] multipurpose destination sheath. The patient received a total of 10,000 units of heparin intravenously with an ACT of 233. Total contrast demonstrated to the patient was 185 mL. I was able to cross the lesion with a 014 [place] core wire and exchanged for a 6 mm spider protection device which was placed in the above-the-knee popliteal artery. Following this I performed [facility] 1 directional atherectomy of the mid right SFA disease segment removing a copious amount of atherosclerotic plaque. I then perform drug eluting balloon angioplasty with a 5 mm x 100 mm long lutonix drug eluting balloon at 4-6 atm for 2.5 minutes resulting in reduction of a 95% stenosis to 0% residual with excellent flow and no dissection. The patient tolerated the procedure well. Completion angiography was performed. The sheath was removed over an 035 wire and exchanged for a short 7 [place] sheath. The patient left the lab in stable condition.  Final Impression: successful Hawk 1 partial arthrectomy, drug eluding balloon angioplasty of high-grade mid right SFA "restenosis" using spider distal protection with an excellent intravascular result. The patient does continue to smoke which I have counseled him against. He is on dual antiplatelet therapy which she will continue. He'll be hydrated overnight, discharged home in the morning. He will return to the office next week and be worked up by mid-level provider for staged left SFA intervention the week of November 14.   Quay Burow. MD, [facility] 02/14/2015 2:29 PM     Treatments: See above  Discharge Exam: Blood pressure 150/62, pulse 61, temperature 98.1 F (36.7  C), temperature source Oral, resp. rate 19, height 6' 1"  (1.854 m), weight 194 lb 14.2 oz (88.4 kg), SpO2 100 %.   Disposition: 01-Home or Self Care      Discharge Instructions    Diet - low sodium heart healthy    Complete by:  As directed      Discharge instructions    Complete by:  As directed   No lifting more than a half gallon of milk or driving for three days.     Increase activity slowly    Complete by:  As directed             Medication List    TAKE these medications        aspirin EC 81 MG tablet  Take 1 tablet (81 mg total) by mouth daily.     atorvastatin 40 MG tablet  Commonly known as:  LIPITOR  Take 1 tablet (40 mg total) by mouth every evening.     clopidogrel 75 MG tablet  Commonly known as:  PLAVIX  Take 1 tablet (75 mg total) by mouth daily. NEE OV.     lisinopril 5 MG tablet  Commonly known as:  PRINIVIL,ZESTRIL  Take 1 tablet (5 mg total) by mouth daily. NEED OV.     LUZU 1 % Crea  Generic drug:  Luliconazole  Apply 1 application topically as needed (athletes foot).        Greater than 30 minutes was spent completing the patient's discharge.    SignedTarri Fuller, [place] 02/15/2015, 9:15 AM  Personally seen and examined. Agree with above. Ready to go Coming back for staged SFA Candee Furbish, MD

## 2015-02-15 NOTE — Progress Notes (Signed)
    Subjective: No complaints  Objective: Vital signs in last 24 hours: Temp:  [98 F (36.7 C)-98.4 F (36.9 C)] 98.1 F (36.7 C) (11/04 0412) Pulse Rate:  [45-66] 64 (11/04 0412) Resp:  [12-22] 19 (11/04 0412) BP: (120-171)/(64-84) 171/75 mmHg (11/04 0412) SpO2:  [93 %-100 %] 100 % (11/04 0412) Weight:  [190 lb (86.183 kg)-194 lb 14.2 oz (88.4 kg)] 194 lb 14.2 oz (88.4 kg) (11/04 0412) Last BM Date: 02/14/15  Intake/Output from previous day: 11/03 0701 - 11/04 0700 In: 790 [P.O.:240; I.V.:550] Out: 400 [Urine:400] Intake/Output this shift:    Medications Scheduled Meds: . aspirin EC  81 mg Oral Daily  . atorvastatin  40 mg Oral QPM  . clopidogrel  75 mg Oral Daily  . Influenza vac split quadrivalent PF  0.5 mL Intramuscular Tomorrow-1000  . lisinopril  5 mg Oral Daily   Continuous Infusions:  PRN Meds:.acetaminophen, morphine injection, ondansetron (ZOFRAN) IV  PE: General appearance: alert, cooperative and no distress Lungs: clear to auscultation bilaterally Heart: regular rate and rhythm, S1, S2 normal, no murmur, click, rub or gallop Extremities: No LEE Pulses: 2+radials and right DP Skin: Left groin:  soft, nontender. Neurologic: Grossly normal  Lab Results:   Recent Labs  02/12/15 1039 02/15/15 0540  WBC 4.4 5.2  HGB 14.3 12.4*  HCT 40.8 35.6*  PLT 208 170   BMET  Recent Labs  02/12/15 1039 02/15/15 0540  NA 137 136  K 4.7 4.0  CL 101 103  CO2 28 26  GLUCOSE 90 145*  BUN 13 11  CREATININE 1.23 1.12  CALCIUM 9.8 8.9   PT/INR  Recent Labs  02/12/15 1039  LABPROT 12.7  INR 0.94    Assessment/Plan  Active Problems:   Claudication (HCC)   PAD (peripheral artery disease) (HCC)  Tobacco abuse   HTN  SP PV angiogram revealing high-grade bilateral SFA restenosis with lifestyle limiting claudication.  He underwent successful Hawk 1 partial arthrectomy, drug eluding balloon angioplasty of high-grade mid right SFA "restenosis" using  spider distal protection with an excellent intravascular result.   ASA, plavix.  He will return for staged left SFA intervention.  FU next week to plan the next procedure for the week of 11/14.   Given lisinopril now for BP.  DC home this morning.     HAGER, BRYAN PA-C 02/15/2015 7:10 AM  Personally seen and examined. Agree with above. Ready to go Coming back for staged SFA Donato SchultzSKAINS, Nyeisha Goodall, MD

## 2015-02-18 ENCOUNTER — Telehealth: Payer: Self-pay | Admitting: *Deleted

## 2015-02-18 ENCOUNTER — Encounter: Payer: Self-pay | Admitting: Cardiovascular Disease

## 2015-02-18 ENCOUNTER — Ambulatory Visit: Payer: BLUE CROSS/BLUE SHIELD | Admitting: Nurse Practitioner

## 2015-02-18 NOTE — Telephone Encounter (Signed)
[facility] stated pt is having a lower angio on 11/14 and pt needs repeat labs will be seeing Norma FredricksonLori Gerhardt, NP, today @ 10: 30 .  Stated pt had labs on 11/4 I do not know protocol on this test will send to Lawson FiscalLori to make aware and decision.  [facility] if pt does not have to get labs today please let pt know at time of ov.

## 2015-02-18 NOTE — Telephone Encounter (Signed)
FYI Labs need to be within 7 days of procedure. Will address tomorrow.  Thanks.

## 2015-02-18 NOTE — Telephone Encounter (Signed)
Patient is not on my schedule for today - went to wrong office - has been rescheduled with Anthony Brock for tomorrow.

## 2015-02-19 ENCOUNTER — Encounter: Payer: Self-pay | Admitting: Cardiology

## 2015-02-19 ENCOUNTER — Ambulatory Visit (INDEPENDENT_AMBULATORY_CARE_PROVIDER_SITE_OTHER): Payer: BLUE CROSS/BLUE SHIELD | Admitting: Cardiology

## 2015-02-19 VITALS — BP 140/80 | HR 54 | Ht 73.0 in | Wt 190.0 lb

## 2015-02-19 DIAGNOSIS — Z720 Tobacco use: Secondary | ICD-10-CM | POA: Diagnosis not present

## 2015-02-19 DIAGNOSIS — E785 Hyperlipidemia, unspecified: Secondary | ICD-10-CM

## 2015-02-19 DIAGNOSIS — Z01812 Encounter for preprocedural laboratory examination: Secondary | ICD-10-CM | POA: Diagnosis not present

## 2015-02-19 DIAGNOSIS — I739 Peripheral vascular disease, unspecified: Secondary | ICD-10-CM

## 2015-02-19 LAB — CBC WITH DIFFERENTIAL/PLATELET
BASOS PCT: 1 % (ref 0–1)
Basophils Absolute: 0 10*3/uL (ref 0.0–0.1)
EOS ABS: 0.1 10*3/uL (ref 0.0–0.7)
Eosinophils Relative: 2 % (ref 0–5)
HCT: 38.4 % — ABNORMAL LOW (ref 39.0–52.0)
Hemoglobin: 13.4 g/dL (ref 13.0–17.0)
Lymphocytes Relative: 53 % — ABNORMAL HIGH (ref 12–46)
Lymphs Abs: 2.1 10*3/uL (ref 0.7–4.0)
MCH: 29.6 pg (ref 26.0–34.0)
MCHC: 34.9 g/dL (ref 30.0–36.0)
MCV: 85 fL (ref 78.0–100.0)
MONO ABS: 0.3 10*3/uL (ref 0.1–1.0)
MONOS PCT: 7 % (ref 3–12)
MPV: 10.1 fL (ref 8.6–12.4)
NEUTROS PCT: 37 % — AB (ref 43–77)
Neutro Abs: 1.4 10*3/uL — ABNORMAL LOW (ref 1.7–7.7)
PLATELETS: 206 10*3/uL (ref 150–400)
RBC: 4.52 MIL/uL (ref 4.22–5.81)
RDW: 12 % (ref 11.5–15.5)
WBC: 3.9 10*3/uL — AB (ref 4.0–10.5)

## 2015-02-19 NOTE — Patient Instructions (Signed)
Medication Instructions:  Your physician recommends that you continue on your current medications as directed. Please refer to the Current Medication list given to you today.   Labwork: Bmet,Cbc, Pt/Inr today  Testing/Procedures: None ordered  Follow-Up: Your physician recommends that you schedule a follow-up appointment after your procedure   Any Other Special Instructions Will Be Listed Below (If Applicable). You should have nothing to eat or drink after midnight on 02/24/15     If you need a refill on your cardiac medications before your next appointment, please call your pharmacy.

## 2015-02-19 NOTE — Progress Notes (Signed)
Cardiology Office Note   Date:  02/19/2015   ID:  Anthony LeschLarry D Furgason, DOB 11/07/1955, MRN [id number]  PCP:  Tonye PearsonOLITTLE, ROBERT P, MD  Cardiologist:  Dr. Allyson SabalBerry    Chief Complaint  Patient presents with  . Follow-up    Lower Extremity Angiography 02/25/15, clearance needed       History of Present Illness: Anthony Brock is a 59 y.o. male who presents for pre procedure H &P.  Pt with hx  Of PAD with lifestyle limiting claudication.  He has had carotid stent by Dr. Allyson SabalBerry.  Now with claudication with Rt > Lt with abnormal dopplers.  In Feb.he had Left SFA followed by drug eluding balloon angioplasty. He had an excellent angiographic and clinical result. He ultimately underwent staged right SFA intervention with excellent angiographic and clinical result.    He did have recurrent claudication with Dopplers performed 02/07/15 revealing high-grade distal bilateral SFA restenosis.  02/14/15 he underwent successful Hawk 1 partial arthrectomy, drug eluding balloon angioplasty of high-grade mid right SFA "restenosis" using spider distal protection with an excellent intravascular result by Dr. Allyson SabalBerry.    Since discharge he has done well, has not really walked any distance to see if claudication has improved.  He did stop smoking.  Her was congratulated.  We discussed importance of stopping with PVD and COPD on CXR.   Plans are for PCI on Lt SFA on 02/25/15.    He denies any chest pain or SOB.  Still with Lt claudication.      Past Medical History  Diagnosis Date  . Hyperglycemia   . Essential hypertension   . High cholesterol   . PAD (peripheral artery disease) (HCC)     a. s/p PTA to L SFA with drug-eluting balloon angioplasty 04/30/14. b. balloon angiography of R SFA PTA 05/24/2014.  . Carotid arterial disease (HCC)     a. 80% LICA by PV angio 04/2014.  . Presence of stent in L Internal Carotid 07/18/2014     7 mm/9 mm x 30 mm Xact Nitinol Self-Expanding Stent   . GERD (gastroesophageal reflux  disease)   . Arthritis     "left ankle" (02/14/2015)    Past Surgical History  Procedure Laterality Date  . Lower extremity angiogram N/A 04/30/2014    Procedure: LOWER EXTREMITY ANGIOGRAM;  Surgeon: Runell GessJonathan J Berry, MD;  [facility];  Service: Cardiovascular;  Laterality: N/A;  . Carotid angiogram N/A 04/30/2014    Procedure: CAROTID ANGIOGRAM;  Surgeon: Runell GessJonathan J Berry, MD;  [facility];  Service: Cardiovascular;  Laterality: N/A;  . Peripheral vascular catheterization Left 04/30/2014    Procedure: PERIPHERAL VASCULAR ATHERECTOMY;  Surgeon: Runell GessJonathan J Berry, MD;  [facility];  Service: Cardiovascular;  Laterality: Left;  lt SFA  . Transluminal atherectomy femoral artery Right 05/24/2014    SFA/notes 05/24/2014  . Carotid stent insertion N/A 07/17/2014    Procedure: CAROTID STENT INSERTION;  Surgeon: Runell GessJonathan J Berry, MD;  Location: [facility] CATH LAB;   7 mm/9 mm x 30 mm Xact Nitinol Self-Expanding Stent  . Inguinal hernia repair Right 2004  . Lower extremity angiogram Right 02/14/2015  . Peripheral vascular catheterization Bilateral 02/14/2015    Procedure: Lower Extremity Angiography;  Surgeon: Runell GessJonathan J Berry, MD;  [facility];  Service: Cardiovascular;  Laterality: Bilateral;  . Peripheral vascular catheterization Right 02/14/2015    Procedure: Peripheral Vascular Atherectomy;  Surgeon: Runell GessJonathan J Berry, MD;  [facility];  Service: Cardiovascular;  Laterality: Right;  SFA PTA DRUG COATED     Current Outpatient Prescriptions  Medication Sig Dispense Refill  . aspirin EC 81 MG tablet Take 1 tablet (81 mg total) by mouth daily.    [name] atorvastatin (LIPITOR) 40 MG tablet Take 1 tablet (40 mg total) by mouth every evening. 30 tablet 6  . clopidogrel (PLAVIX) 75 MG tablet Take 1 tablet (75 mg total) by mouth daily. NEE OV. 30 tablet 0  . lisinopril (PRINIVIL,ZESTRIL) 5 MG tablet Take 1 tablet (5 mg total) by mouth daily. NEED OV. 30 tablet  1  . LUZU 1 % CREA Apply 1 application topically as needed (athletes foot).      No current facility-administered medications for this visit.    Allergies:   Review of patient's allergies indicates no known allergies.    Social History:  The patient  reports that he has been smoking Cigarettes.  He has a 22.5 pack-year smoking history. He has never used smokeless tobacco. He reports that he drinks alcohol. He reports that he uses illicit drugs.   Family History:  The patient's family history includes Alzheimer's disease in his mother and sister; Emphysema in his father.    ROS:  General:no colds or fevers, no weight changes Skin:no rashes or ulcers HEENT:no blurred vision, no congestion CV:see HPI PUL:see HPI GI:no diarrhea constipation or melena, no indigestion GU:no hematuria, no dysuria MS:no joint pain, + claudication Neuro:no syncope, no lightheadedness Endo:no diabetes, no thyroid disease  Wt Readings from Last 3 Encounters:  02/19/15 190 lb (86.183 kg)  02/15/15 194 lb 14.2 oz (88.4 kg)  02/12/15 188 lb (85.276 kg)     PHYSICAL EXAM: VS:  BP 140/80 mmHg  Pulse 54  Ht  (1.854 m)  Wt 190 lb (86.183 kg)  BMI 25.07 kg/m2  SpO2 98% , BMI Body mass index is 25.07 kg/(m^2). General:Pleasant affect, NAD Skin:Warm and dry, brisk capillary refill HEENT:normocephalic, sclera clear, mucus membranes moist Neck:supple, no JVD, no bruits  Heart:S1S2 RRR without murmur, gallup, rub or click Lungs:clear without rales, rhonchi, or wheezes WGN:FAOZ, non tender, + BS, do not palpate liver spleen or masses Ext:no lower ext edema,  2+ radial pulses Neuro:alert and oriented X 3, MAE, follows commands, + facial symmetry    EKG:  EKG is ordered today. The ekg ordered today demonstrates SB rate of 54, no acute changes.    Recent Labs: 04/24/2014: ALT 18; TSH 1.432 02/15/2015: BUN 11; Creatinine, Ser 1.12; Hemoglobin 12.4*; Platelets 170; Potassium 4.0; Sodium 136    Lipid  Panel    Component Value Date/Time   CHOL 174 04/24/2014 1418   TRIG 281* 04/24/2014 1418   HDL 36* 04/24/2014 1418   CHOLHDL 4.8 04/24/2014 1418   VLDL 56* 04/24/2014 1418   LDLCALC 82 04/24/2014 1418       Other studies Reviewed: Additional studies/ records that were reviewed today include:PCI of rt leg. Labs.    ASSESSMENT AND PLAN:  1.  PAD with need for PCI on Lt SFA scheduled for 02/25/15- Dr. Erlene Quan will need Scott with [employer] for procedure.  2. Previous carotid stent and rt SFA stent.  3. Tobacco abuse -has stopped smoking after most recent  PCI. Congratulated.   The patient understands that risks included but are not limited  (1 in 500), bleeding (1 in 200), allergic reaction [possibly serious] (1 in 200).     Current medicines are reviewed with the patient today.  The patient Has  no concerns regarding medicines.  The following changes have been made:  See above Labs/ tests ordered today include:see above  Disposition:   FU:  see above  Nyoka Lint, NP  02/19/2015 12:14 PM    [facility] [address] [place], [place], [place]  [address]/ [address] [place], [place] Phone: [phone]; Fax: [phone]  [phone]

## 2015-02-20 LAB — BASIC METABOLIC PANEL
BUN: 12 mg/dL (ref 7–25)
CALCIUM: 9.6 mg/dL (ref 8.6–10.3)
CO2: 27 mmol/L (ref 20–31)
CREATININE: 1.12 mg/dL (ref 0.70–1.25)
Chloride: 98 mmol/L (ref 98–110)
Glucose, Bld: 110 mg/dL — ABNORMAL HIGH (ref 65–99)
Potassium: 4.3 mmol/L (ref 3.5–5.3)
Sodium: 133 mmol/L — ABNORMAL LOW (ref 135–146)

## 2015-02-20 LAB — PROTIME-INR
INR: 0.97 (ref <1.50)
PROTHROMBIN TIME: 13 s (ref 11.6–15.2)

## 2015-02-22 ENCOUNTER — Other Ambulatory Visit: Payer: Self-pay

## 2015-02-22 DIAGNOSIS — I739 Peripheral vascular disease, unspecified: Secondary | ICD-10-CM

## 2015-02-25 ENCOUNTER — Encounter (HOSPITAL_COMMUNITY)
Admission: RE | Disposition: A | Discharge status: Home / Self Care | Payer: Self-pay | Source: Ambulatory Visit | Attending: Cardiovascular Disease

## 2015-02-25 ENCOUNTER — Ambulatory Visit (HOSPITAL_COMMUNITY)
Admission: RE | Admit: 2015-02-25 | Discharge: 2015-02-26 | Disposition: A | Discharge status: Home / Self Care | Payer: BLUE CROSS/BLUE SHIELD | Source: Ambulatory Visit | Attending: Cardiovascular Disease | Admitting: Cardiovascular Disease

## 2015-02-25 ENCOUNTER — Encounter (HOSPITAL_COMMUNITY): Payer: Self-pay | Admitting: Cardiovascular Disease

## 2015-02-25 DIAGNOSIS — Z87891 Personal history of nicotine dependence: Secondary | ICD-10-CM | POA: Insufficient documentation

## 2015-02-25 DIAGNOSIS — E785 Hyperlipidemia, unspecified: Secondary | ICD-10-CM | POA: Insufficient documentation

## 2015-02-25 DIAGNOSIS — I739 Peripheral vascular disease, unspecified: Secondary | ICD-10-CM

## 2015-02-25 DIAGNOSIS — I10 Essential (primary) hypertension: Secondary | ICD-10-CM | POA: Insufficient documentation

## 2015-02-25 DIAGNOSIS — I70212 Atherosclerosis of native arteries of extremities with intermittent claudication, left leg: Secondary | ICD-10-CM | POA: Insufficient documentation

## 2015-02-25 HISTORY — PX: PERIPHERAL VASCULAR CATHETERIZATION: SHX172C

## 2015-02-25 LAB — POCT ACTIVATED CLOTTING TIME: ACTIVATED CLOTTING TIME: 128 s

## 2015-02-25 SURGERY — PERIPHERAL VASCULAR ATHERECTOMY

## 2015-02-25 MED ORDER — ASPIRIN 81 MG PO CHEW
81.0000 mg | CHEWABLE_TABLET | ORAL | Status: AC
  Administered 2015-02-25: 81 mg via ORAL

## 2015-02-25 MED ORDER — ANGIOPLASTY BOOK
Freq: Once | Status: DC
  Filled 2015-02-25: qty 1

## 2015-02-25 MED ORDER — CLOPIDOGREL BISULFATE 75 MG PO TABS: 75.0000 mg | ORAL_TABLET | Freq: Every day | ORAL | Status: DC

## 2015-02-25 MED ORDER — MORPHINE SULFATE (PF) 2 MG/ML IV SOLN: 2.0000 mg | INTRAVENOUS | Status: DC | PRN

## 2015-02-25 MED ORDER — HEPARIN (PORCINE) IN NACL 2-0.9 UNIT/ML-% IJ SOLN
INTRAMUSCULAR | Status: AC
  Filled 2015-02-25: qty 1000

## 2015-02-25 MED ORDER — HEPARIN SODIUM (PORCINE) 1000 UNIT/ML IJ SOLN
INTRAMUSCULAR | Status: DC | PRN
  Administered 2015-02-25: 6000 [IU] via INTRAVENOUS

## 2015-02-25 MED ORDER — FENTANYL CITRATE (PF) 100 MCG/2ML IJ SOLN
INTRAMUSCULAR | Status: DC | PRN
  Administered 2015-02-25: 25 ug via INTRAVENOUS

## 2015-02-25 MED ORDER — ASPIRIN 81 MG PO CHEW
CHEWABLE_TABLET | ORAL | Status: AC
  Administered 2015-02-25: 81 mg via ORAL
  Filled 2015-02-25: qty 1

## 2015-02-25 MED ORDER — HYDRALAZINE HCL 20 MG/ML IJ SOLN
INTRAMUSCULAR | Status: DC | PRN
  Administered 2015-02-25: 10 mg via INTRAVENOUS

## 2015-02-25 MED ORDER — SODIUM CHLORIDE 0.9 % WEIGHT BASED INFUSION: 1.0000 mL/kg/h | INTRAVENOUS | Status: DC

## 2015-02-25 MED ORDER — ACETAMINOPHEN 325 MG PO TABS: 650.0000 mg | ORAL_TABLET | ORAL | Status: DC | PRN

## 2015-02-25 MED ORDER — HYDRALAZINE HCL 20 MG/ML IJ SOLN
10.0000 mg | INTRAMUSCULAR | Status: DC | PRN
Start: 2015-02-25 — End: 2015-02-26

## 2015-02-25 MED ORDER — MIDAZOLAM HCL 2 MG/2ML IJ SOLN
INTRAMUSCULAR | Status: DC | PRN
  Administered 2015-02-25: 1 mg via INTRAVENOUS

## 2015-02-25 MED ORDER — LIDOCAINE HCL (PF) 1 % IJ SOLN
INTRAMUSCULAR | Status: DC | PRN
  Administered 2015-02-25: 25 mL

## 2015-02-25 MED ORDER — HEPARIN SODIUM (PORCINE) 1000 UNIT/ML IJ SOLN
INTRAMUSCULAR | Status: AC
  Filled 2015-02-25: qty 1

## 2015-02-25 MED ORDER — ATROPINE SULFATE 0.1 MG/ML IJ SOLN
INTRAMUSCULAR | Status: AC
  Filled 2015-02-25: qty 10

## 2015-02-25 MED ORDER — FENTANYL CITRATE (PF) 100 MCG/2ML IJ SOLN
INTRAMUSCULAR | Status: AC
  Filled 2015-02-25: qty 4

## 2015-02-25 MED ORDER — LIDOCAINE HCL (PF) 1 % IJ SOLN
INTRAMUSCULAR | Status: AC
  Filled 2015-02-25: qty 30

## 2015-02-25 MED ORDER — HYDRALAZINE HCL 20 MG/ML IJ SOLN
INTRAMUSCULAR | Status: AC
  Filled 2015-02-25: qty 1

## 2015-02-25 MED ORDER — SODIUM CHLORIDE 0.9 % IJ SOLN: 3.0000 mL | INTRAMUSCULAR | Status: DC | PRN

## 2015-02-25 MED ORDER — ONDANSETRON HCL 4 MG/2ML IJ SOLN: 4.0000 mg | Freq: Four times a day (QID) | INTRAMUSCULAR | Status: DC | PRN

## 2015-02-25 MED ORDER — ASPIRIN EC 325 MG PO TBEC: 325.0000 mg | DELAYED_RELEASE_TABLET | Freq: Every day | ORAL | Status: DC

## 2015-02-25 MED ORDER — LISINOPRIL 5 MG PO TABS
5.0000 mg | ORAL_TABLET | Freq: Every day | ORAL | Status: DC
Start: 2015-02-26 — End: 2015-02-26

## 2015-02-25 MED ORDER — SODIUM CHLORIDE 0.9 % WEIGHT BASED INFUSION
3.0000 mL/kg/h | INTRAVENOUS | Status: DC
  Administered 2015-02-25: 3 mL/kg/h via INTRAVENOUS

## 2015-02-25 MED ORDER — ATORVASTATIN CALCIUM 40 MG PO TABS
40.0000 mg | ORAL_TABLET | Freq: Every evening | ORAL | Status: DC
  Administered 2015-02-25: 17:00:00 40 mg via ORAL
  Filled 2015-02-25: qty 1

## 2015-02-25 MED ORDER — SODIUM CHLORIDE 0.9 % IV SOLN: INTRAVENOUS | Status: AC

## 2015-02-25 MED ORDER — ASPIRIN EC 81 MG PO TBEC: 81.0000 mg | DELAYED_RELEASE_TABLET | Freq: Every day | ORAL | Status: DC

## 2015-02-25 MED ORDER — MIDAZOLAM HCL 2 MG/2ML IJ SOLN
INTRAMUSCULAR | Status: AC
  Filled 2015-02-25: qty 4

## 2015-02-25 SURGICAL SUPPLY — 18 items
BALLN LUTONIX 5X150X130 (BALLOONS) ×4
BALLOON LUTONIX 5X150X130 (BALLOONS) ×2 IMPLANT
CATH CROSS OVER TEMPO 5F (CATHETERS) ×4 IMPLANT
CATH HAWKONE LX EXTENDED TIP (CATHETERS) ×4 IMPLANT
DEVICE CONTINUOUS FLUSH (MISCELLANEOUS) ×4 IMPLANT
DEVICE SPIDERFX EMB PROT 6MM (WIRE) ×4 IMPLANT
KIT ENCORE 26 ADVANTAGE (KITS) ×4 IMPLANT
KIT PV (KITS) ×4 IMPLANT
SHEATH CLASSIC 7F (SHEATH) ×4 IMPLANT
SHEATH HIGHFLEX ANSEL 7FR 55CM (SHEATH) ×4 IMPLANT
SHEATH PINNACLE 5F 10CM (SHEATH) ×4 IMPLANT
SHEATH PINNACLE 7F 10CM (SHEATH) ×4 IMPLANT
SYRINGE MEDRAD AVANTA MACH 7 (SYRINGE) ×4 IMPLANT
TAPE RADIOPAQUE TURBO (MISCELLANEOUS) ×8 IMPLANT
TRANSDUCER W/STOPCOCK (MISCELLANEOUS) ×4 IMPLANT
TRAY PV CATH (CUSTOM PROCEDURE TRAY) ×4 IMPLANT
WIRE HITORQ VERSACORE ST 145CM (WIRE) ×4 IMPLANT
WIRE SPARTACORE .014X300CM (WIRE) ×4 IMPLANT

## 2015-02-25 NOTE — Interval H&P Note (Signed)
History and Physical Interval Note:  02/25/2015 9:19 AM  Anthony Brock  has presented today for surgery, with the diagnosis of claudication  The various methods of treatment have been discussed with the patient and family. After consideration of risks, benefits and other options for treatment, the patient has consented to  Procedure(s): Lower Extremity Angiography (N/A) as a surgical intervention .  The patient's history has been reviewed, patient examined, no change in status, stable for surgery.  I have reviewed the patient's chart and labs.  Questions were answered to the patient's satisfaction.     [name], Jonathan

## 2015-02-25 NOTE — Progress Notes (Signed)
Site area: right groin  Site Prior to Removal:  Level 0  Pressure Applied For 47 MINUTES    Minutes Beginning at 1335  Manual:   Yes.    Patient Status During Pull:  AAO X 4  Post Pull Groin Site:  Level 0  Post Pull Instructions Given:  Yes.    Post Pull Pulses Present:  Yes.    Dressing Applied:  Yes.    Comments: site   re bleed post 20 mins initial  Manual pressure , add'l  20 min manual pressure  done , bleeding resoved

## 2015-02-25 NOTE — H&P (View-Only) (Signed)
02/12/2015 Anthony Brock   30-Dec-1955  [id number]  Primary Physician DOOLITTLE, Harrel Lemon, MD Primary Cardiologist: Runell Gess MD Roseanne Reno   HPI:  Mr. Biello is a 59 year old married African-American veteran who works at [employer]. He was referred by Dr. Fanny Dance at [facility] for peripheral vascular evaluation because of lifestyle limiting claudication. last time I saw him was at the time of his carotid stenting 07/17/14. He has a long history of tobacco abuse smoking three-quarter pack a day for last 30 years. He has never had a heart attack or stroke, and denies chest pain or shortness of breath. He states that he has had left greater than right lower extremity claudication which is lifestyle limiting for several years. Recent Dopplers performed in our office 03/20/14 revealed a right ABI 0.58 with an occluded distal right SFA and a left ABI 0.41 with a high-frequency signal in his left SFA. He underwent angiography and intervention on January 18 revealing a 80% Left internal carotid artery stenosis, and short segment occlusions in both SFAs. I was able to perform directional atherectomy on his left SFA followed by drug eluding balloon angioplasty. He had an excellent angiographic and clinical result. He ultimately underwent staged right SFA intervention with excellent angiographic and clinical result. He underwent elective left internal carotid artery stenting for high-grade (80%) asymptomatic left internal carotid artery stenosis 07/17/14. His recent carotid Dopplers performed 02/07/15 reveals to be widely patent. He does have recurrent claudication with Dopplers performed 02/07/15 revealing high-grade distal bilateral SFA restenosis.   Current Outpatient Prescriptions  Medication Sig Dispense Refill  . aspirin EC 81 MG tablet Take 1 tablet (81 mg total) by mouth daily.    [name] atorvastatin (LIPITOR) 40 MG tablet Take 1 tablet (40 mg total) by mouth every evening. 30 tablet  6  . clopidogrel (PLAVIX) 75 MG tablet Take 1 tablet (75 mg total) by mouth daily. NEE OV. 30 tablet 0  . lisinopril (PRINIVIL,ZESTRIL) 5 MG tablet Take 1 tablet (5 mg total) by mouth daily. NEED OV. 30 tablet 1  . LUZU 1 % CREA Apply 1 application topically as needed (athletes foot).      No current facility-administered medications for this visit.    No Known Allergies  Social History   Social History  . Marital Status: Married    Spouse Name: N/A  . Number of Children: N/A  . Years of Education: N/A   Occupational History  . Not on file.   Social History Main Topics  . Smoking status: Former Smoker -- 0.50 packs/day for 39 years    Types: Cigarettes    Quit date: 04/30/2014  . Smokeless tobacco: Never Used  . Alcohol Use: 3.6 oz/week    6 Cans of beer per week  . Drug Use: Yes     Comment: "might have used some drugs in my 20's"  . Sexual Activity: Yes   Other Topics Concern  . Not on file   Social History Narrative     Review of Systems: General: negative for chills, fever, night sweats or weight changes.  Cardiovascular: negative for chest pain, dyspnea on exertion, edema, orthopnea, palpitations, paroxysmal nocturnal dyspnea or shortness of breath Dermatological: negative for rash Respiratory: negative for cough or wheezing Urologic: negative for hematuria Abdominal: negative for nausea, vomiting, diarrhea, bright red blood per rectum, melena, or hematemesis Neurologic: negative for visual changes, syncope, or dizziness All other systems reviewed and are otherwise negative except as noted above.  Blood pressure 130/68, pulse 65, height 6\' 1"  (1.854 m), weight 188 lb (85.276 kg).  General appearance: alert and no distress Neck: no adenopathy, no carotid bruit, no JVD, supple, symmetrical, trachea midline and thyroid not enlarged, symmetric, no tenderness/mass/nodules Lungs: clear to auscultation bilaterally Heart: regular rate and rhythm, S1, S2 normal, no  murmur, click, rub or gallop Extremities: extremities normal, atraumatic, no cyanosis or edema  EKG normal sinus rhythm at 65 without ST or T-wave changes. I personally reviewed this EKG  ASSESSMENT AND PLAN:   Tobacco abuse Continued tobacco abuse recalcitrant to risk factor modification  Presence of stent in L Internal Carotid History of carotid artery disease status post left internal carotid artery stenting by myself 07/17/14. Recent carotid Dopplers performed 02/07/15 revealed this to be widely patent. He is neurologically asymptomatic on dual antiplatelet therapy.  PAD (peripheral artery disease) History of peripheral arterial disease status post staged bilateral SFA intervention 04/30/14 on the left followed by 05/24/14 on the right. He had recurrent symptoms beginning several months ago with recent Dopplers that showed high-grade disease in both distal SFAs. We will plan on performing angiography and renal intervention.  Hyperlipidemia with target LDL less than 70 History of hyperlipidemia on atorvastatin 40 mg a day with recent lipid profile performed 04/24/14 revealed a total cholesterol 174, LDL 82 and HDL of 36  Essential hypertension History of hypertension blood pressure measured 130/68. He is on lisinopril. Continue current meds at current dosing      Runell GessJonathan J. Rennie Hack MD [facility] LtdFACP,FACC,FAHA, [facility] 02/12/2015 10:16 AM

## 2015-02-26 ENCOUNTER — Other Ambulatory Visit: Payer: Self-pay | Admitting: Student

## 2015-02-26 DIAGNOSIS — I70212 Atherosclerosis of native arteries of extremities with intermittent claudication, left leg: Secondary | ICD-10-CM | POA: Diagnosis not present

## 2015-02-26 DIAGNOSIS — I739 Peripheral vascular disease, unspecified: Secondary | ICD-10-CM

## 2015-02-26 LAB — BASIC METABOLIC PANEL
Anion gap: 6 (ref 5–15)
BUN: 10 mg/dL (ref 6–20)
CHLORIDE: 106 mmol/L (ref 101–111)
CO2: 25 mmol/L (ref 22–32)
CREATININE: 1.07 mg/dL (ref 0.61–1.24)
Calcium: 9.1 mg/dL (ref 8.9–10.3)
GFR calc non Af Amer: >60 mL/min (ref >60)
Glucose, Bld: 151 mg/dL — ABNORMAL HIGH (ref 65–99)
Potassium: 4.1 mmol/L (ref 3.5–5.1)
Sodium: 137 mmol/L (ref 135–145)

## 2015-02-26 LAB — CBC
HCT: 36.3 % — ABNORMAL LOW (ref 39.0–52.0)
Hemoglobin: 12.6 g/dL — ABNORMAL LOW (ref 13.0–17.0)
MCH: 30.1 pg (ref 26.0–34.0)
MCHC: 34.7 g/dL (ref 30.0–36.0)
MCV: 86.8 fL (ref 78.0–100.0)
PLATELETS: 196 10*3/uL (ref 150–400)
RBC: 4.18 MIL/uL — AB (ref 4.22–5.81)
RDW: 11.9 % (ref 11.5–15.5)
WBC: 4.4 10*3/uL (ref 4.0–10.5)

## 2015-02-26 LAB — POCT ACTIVATED CLOTTING TIME
Activated Clotting Time: 196 seconds
Activated Clotting Time: 239 seconds

## 2015-02-26 NOTE — Discharge Instructions (Signed)

## 2015-02-26 NOTE — Progress Notes (Signed)
     SUBJECTIVE: No chest pain, SOB. No leg pain.   BP 156/69 mmHg  Pulse 60  Temp(Src) 98.3 F (36.8 C) (Oral)  Resp 19  Ht 6\' 1"  (1.854 m)  Wt 191 lb 12.8 oz (87 kg)  BMI 25.31 kg/m2  SpO2 96%  Intake/Output Summary (Last 24 hours) at 02/26/15 0723 Last data filed at 02/26/15 0406  Gross per 24 hour  Intake 1417.5 ml  Output   2250 ml  Net -832.5 ml    PHYSICAL EXAM General: Well developed, well nourished, in no acute distress. Alert and oriented x 3.  Psych:  Good affect, responds appropriately Neck: No JVD. No masses noted.  Lungs: Clear bilaterally with no wheezes or rhonci noted.  Heart: RRR with no murmurs noted. Abdomen: Bowel sounds are present. Soft, non-tender.  Extremities: No lower extremity edema. Right groin without hematoma.   LABS: Basic Metabolic Panel:  Recent Labs  29/52/8411/15/16 0610  NA 137  K 4.1  CL 106  CO2 25  GLUCOSE 151*  BUN 10  CREATININE 1.07  CALCIUM 9.1   CBC:  Recent Labs  02/26/15 0610  WBC 4.4  HGB 12.6*  HCT 36.3*  MCV 86.8  PLT 196   Current Meds: . angioplasty book   Does not apply Once  . aspirin EC  325 mg Oral Daily  . atorvastatin  40 mg Oral QPM  . clopidogrel  75 mg Oral Q breakfast  . lisinopril  5 mg Oral Daily     ASSESSMENT AND PLAN:  1. PAD: Pt admitted following distal left SFA atherectomy/drug eluting balloon angioplasty 02/25/15. He is stable this am. Continue ASA, Plavix, statin. Discharge home today. Follow [facility] Allyson SabalBerry [facility] per post PV protocol with ABI next week.   Gregg Holster  11/15/20167:23 AM

## 2015-02-26 NOTE — Progress Notes (Signed)
Patient requested to take his own medications at home. Rechecked blood pressure in right and left arms. Right arm blood pressure at 832 was 177/67 and left arm blood pressure at 835 was 151/83. Notified Randall AnBrittany Strader PA.  Instructed patient to request that blood pressures be taken in both arms when he goes to his follow up appointment with Dr. Allyson SabalBerry and let him know that he had a difference of 25 in blood pressure in right and left arms. Patient agreed and notes written on discharge paperwork.

## 2015-02-26 NOTE — Discharge Summary (Signed)
CARDIOLOGY DISCHARGE SUMMARY   Patient ID: Anthony Brock MRN: [id number] DOB/AGE: June 05, 1955 59 y.o.  Admit date: 02/25/2015 Discharge date: 02/26/2015  PCP: Leandrew Koyanagi, MD Primary Cardiologist: Dr. Gwenlyn Found  Primary Discharge Diagnosis: Claudication [facility]) Secondary Discharge Diagnosis: PAD (peripheral artery disease) ([place])  Consults: None  Procedures: Peripheral Vascular Atherectomy  Hospital Course: Anthony Brock is a 59 y.o. male with past medical history of PAD (s/p bilateral SFA intervention 04/2014 and 05/2014), carotid artery stenosis (s/p left CA stent 07/2014) HLD, HTN, and tobacco abuse who presented for left peripheral vascular atherectomy on 02/25/2015.  He had dopplers performed in the office on 02/07/2015 which showed high-grade distal bilateral SFA restenosis. He underwent right SFA directional arthrectomy and drug-eluting balloon angioplasty on 02/15/15 with excellent angiographic and clinical result. It was recommended he undergo staged left SFA intervention for his continued claudication symptoms. The risks and benefits of the procedure were explained in detail to the patient and he agreed to proceed.   The patient presented to [facility] his procedure on 02/25/2015. He underwent PV angiography which showed 80% tandem mid-left SFA stenosis. It was decided best to proceed with directional atherectomy and drug-eluting balloon angioplasty. Angioplasty was successful with a palpable dorsalis pedis pulse at the end of the case. No complications were noted during the procedure.  On the morning of 02/26/2015, the patient was doing well and denied any leg pain, chest pain, or shortness of breath. Vitals and lab values were reviewed and appeared stable. The patient was last examined by Dr. Angelena Form and deemed stable for discharge.  Prior to discharge, the nurse checked the patient's BP due to him wanting to wait and take his blood pressure medications at  home. When checked, the pressure in his right arm was 177/67 and in his left arm was 151/83. His blood pressure should be checked in both arms at the time of his follow-up appointment.   He will have ABI's and follow-up doppler studies in the office next week (orders placed and staff message sent for appointment time) and will follow-up with Dr. Gwenlyn Found on 03/19/2015.   Labs:   Lab Results  Component Value Date   WBC 4.4 02/26/2015   HGB 12.6* 02/26/2015   HCT 36.3* 02/26/2015   MCV 86.8 02/26/2015   PLT 196 02/26/2015     Recent Labs Lab 02/26/15 0610  NA 137  K 4.1  CL 106  CO2 25  BUN 10  CREATININE 1.07  CALCIUM 9.1  GLUCOSE 151*   Lipid Panel     Component Value Date/Time   CHOL 174 04/24/2014 1418   TRIG 281* 04/24/2014 1418   HDL 36* 04/24/2014 1418   CHOLHDL 4.8 04/24/2014 1418   VLDL 56* 04/24/2014 1418   LDLCALC 82 04/24/2014 1418      Radiology: No results found.  Angiographic Data: 02/25/2015  1: Left lower extremity-80% tandem mid left SFA stenoses with 2 vessel runoff. The posterior tibial is occluded.  IMPRESSION: 80% tandem mid left SFA stenoses. We'll proceed with directional atherectomy and drug-eluting balloon angioplasty  Procedure Description:the patient received 6000 units of heparin intravenously with an ACT of 239. Total contrast administered to the patient was 85 mL. I crossed the lesion with a 014 [place] core wire and placed a 6 mm spider distal protection device in the above-the-knee popliteal artery. Following this I performed directional atherectomy of the mid left SFA with a hawk 1 atherectomy device followed by drug eluting  balloon angioplasty with a 5 mm x 150 mm long lutonix drug eluding balloon at 4 atm for 2-1/2 minutes. The final angiographic result was reduction of tandem 80% mid left SFA stenoses to 0% residual. The patient tolerated the procedure well. The sheath was then withdrawn across the bifurcation and exchanged over an 035 wire  for a short 7 [place] sheath.  Final Impression: successful talk one directional atherectomy, drug eluding balloon angioplasty using spider distal protection of high-grade tandem/segmental mid left definite SFA stenoses. Patient had a palpable dorsalis pedis pulse at the end of the case. He will be treated with dual antiplatelet therapy, hydrated overnight and discharged home in the morning. We will obtain lower extremity arterial Doppler studies and her [place] office next week and I will see him back to 3 weeks thereafter. He left the lab in stable condition.   FOLLOW UP PLANS AND APPOINTMENTS No Known Allergies   Medication List    TAKE these medications        aspirin EC 81 MG tablet  Take 1 tablet (81 mg total) by mouth daily.  Notes to Patient:  Prevents clotting in stent and decreased blood flow to legs     atorvastatin 40 MG tablet  Commonly known as:  LIPITOR  Take 1 tablet (40 mg total) by mouth every evening.  Notes to Patient:  Cholesterol      clopidogrel 75 MG tablet  Commonly known as:  PLAVIX  Take 1 tablet (75 mg total) by mouth daily. NEE OV.  Notes to Patient:  Prevents clotting in stent and decreased blood flow to legs     lisinopril 5 MG tablet  Commonly known as:  PRINIVIL,ZESTRIL  Take 1 tablet (5 mg total) by mouth daily. NEED OV.  Notes to Patient:  Blood pressure     LUZU 1 % Crea  Generic drug:  Luliconazole  Apply 1 application topically as needed (athletes foot).         Follow-up Information    Follow up with Quay Burow, MD On 03/19/2015.   Specialties:  Cardiology, Radiology   Why:  [place] with Dr. Gwenlyn Found on 03/19/2015 at 2:00PM   Contact information:   [address] [place] [address] [phone]       Follow up with [facility].   Specialty:  Cardiology   Why:  The office will call you in the next few days to schedule follow-up Doppler studies next week. If you do not hear from them by  Friday, please call the number above.    Contact information:   [address] [place] [facility] [phone]      BRING ALL MEDICATIONS WITH YOU TO FOLLOW UP APPOINTMENTS  Time spent with patient to include physician time: 35 minutes Signed: Erma Heritage, PA 02/26/2015, 1:15 PM Co-Sign MD

## 2015-02-26 NOTE — Care Management Note (Signed)
Case Management Note  Patient Details  Name: Jearld LeschLarry D Kasler MRN: [id number] Date of Birth: 03/15/1956  Subjective/Objective:     distal left SFA atherectomy/drug eluting balloon angioplasty 02/25/15               Action/Plan:  No NCM needs identifed.    Expected Discharge Date:  02/26/2015               Expected Discharge Plan:  Home/Self Care  In-House Referral:     Discharge planning Services  CM consult   Status of Service:  Completed, signed off  Medicare Important Message Given:    Date Medicare IM Given:    Medicare IM give by:    Date Additional Medicare IM Given:    Additional Medicare Important Message give by:     If discussed at Long Length of Stay Meetings, dates discussed:    Additional Comments:  Elliot CousinShavis, Geoffery Aultman Ellen, RN 02/26/2015, 9:06 AM

## 2015-03-04 ENCOUNTER — Other Ambulatory Visit: Payer: Self-pay | Admitting: Cardiovascular Disease

## 2015-03-04 DIAGNOSIS — I739 Peripheral vascular disease, unspecified: Secondary | ICD-10-CM

## 2015-03-11 ENCOUNTER — Telehealth: Payer: Self-pay | Admitting: Cardiovascular Disease

## 2015-03-11 ENCOUNTER — Encounter: Payer: Self-pay | Admitting: Internal Medicine

## 2015-03-11 NOTE — Telephone Encounter (Signed)
Returned call to patient.He stated he received a note to return to work today.Stated he will need Dr.Berry to complete a fitness to return to duty form.Stated since he has to have a doppler this Wed 11/30 he would like to return to work after he sees Dr.Berry 03/19/15.Stated he will need a letter to return to work 03/19/15 by tomorrow 03/12/15.Advised Dr.Berry's nurse Cala BradfordKimberly out of office.Stated if she can call him back 11/29 when note is ready for pick up.

## 2015-03-11 NOTE — Telephone Encounter (Signed)
Calling because he has an excuse to return to work today , and before he can return to work his job requires him to have his doctor to complete a fit for duty form. Also is Orland Jarredaking if his return to work note to be on 03/30/15.  Please call     Thanks

## 2015-03-12 NOTE — Telephone Encounter (Signed)
Pt aware that MD approve him to be out of work until f/u appt on 12/6.  Letter placed at front desk and pt aware and to pick up on 11/30.

## 2015-03-12 NOTE — Telephone Encounter (Signed)
That’s fine with me

## 2015-03-12 NOTE — Telephone Encounter (Addendum)
Pt called back and reviewed below with him. Pt is not having any pain or discomfort from procedure just would feel more comfortable returning to work after is 12/6 appt with Dr. Allyson SabalBerry.  Pt works for a [employer] that requires lifting and bending.   Left message for patient that issue has not been able to be addressed as of yet but I will call you as soon as I have an updated for him

## 2015-03-13 ENCOUNTER — Ambulatory Visit (HOSPITAL_COMMUNITY)
Admission: RE | Admit: 2015-03-13 | Discharge: 2015-03-13 | Disposition: A | Discharge status: Home / Self Care | Payer: BLUE CROSS/BLUE SHIELD | Source: Ambulatory Visit | Attending: Student | Admitting: Student

## 2015-03-13 ENCOUNTER — Ambulatory Visit: Payer: BLUE CROSS/BLUE SHIELD | Admitting: Cardiovascular Disease

## 2015-03-13 ENCOUNTER — Ambulatory Visit (HOSPITAL_COMMUNITY)
Admission: RE | Admit: 2015-03-13 | Discharge: 2015-03-13 | Disposition: A | Discharge status: Home / Self Care | Payer: BLUE CROSS/BLUE SHIELD | Source: Ambulatory Visit | Attending: Internal Medicine | Admitting: Internal Medicine

## 2015-03-13 DIAGNOSIS — I70203 Unspecified atherosclerosis of native arteries of extremities, bilateral legs: Secondary | ICD-10-CM | POA: Diagnosis not present

## 2015-03-13 DIAGNOSIS — E7800 Pure hypercholesterolemia, unspecified: Secondary | ICD-10-CM | POA: Diagnosis not present

## 2015-03-13 DIAGNOSIS — I10 Essential (primary) hypertension: Secondary | ICD-10-CM | POA: Insufficient documentation

## 2015-03-13 DIAGNOSIS — I739 Peripheral vascular disease, unspecified: Secondary | ICD-10-CM | POA: Diagnosis not present

## 2015-03-13 DIAGNOSIS — R938 Abnormal findings on diagnostic imaging of other specified body structures: Secondary | ICD-10-CM | POA: Diagnosis not present

## 2015-03-15 ENCOUNTER — Other Ambulatory Visit: Payer: Self-pay | Admitting: Physician Assistant

## 2015-03-15 NOTE — Telephone Encounter (Signed)
Rx request sent to pharmacy.  

## 2015-03-19 ENCOUNTER — Encounter: Payer: Self-pay | Admitting: Cardiovascular Disease

## 2015-03-19 ENCOUNTER — Ambulatory Visit (INDEPENDENT_AMBULATORY_CARE_PROVIDER_SITE_OTHER): Payer: BLUE CROSS/BLUE SHIELD | Admitting: Cardiovascular Disease

## 2015-03-19 VITALS — BP 128/70 | HR 77 | Ht 73.0 in | Wt 194.0 lb

## 2015-03-19 DIAGNOSIS — I739 Peripheral vascular disease, unspecified: Secondary | ICD-10-CM | POA: Diagnosis not present

## 2015-03-19 DIAGNOSIS — Z720 Tobacco use: Secondary | ICD-10-CM

## 2015-03-19 MED ORDER — CLOPIDOGREL BISULFATE 75 MG PO TABS: 75.0000 mg | ORAL_TABLET | Freq: Every day | ORAL | Status: DC

## 2015-03-19 MED ORDER — SILDENAFIL CITRATE 20 MG PO TABS: 100.0000 mg | ORAL_TABLET | ORAL | Status: DC | PRN

## 2015-03-19 NOTE — Assessment & Plan Note (Signed)
History of hypertension blood pressure measured at 120/70. He is on lisinopril 5 mg a day. Continued current meds at current dosing

## 2015-03-19 NOTE — Progress Notes (Signed)
03/19/2015 Anthony Brock   05/08/1955  [id number]  Primary Physician DOOLITTLE, Harrel Lemon, MD Primary Cardiologist: Runell Gess MD Roseanne Reno   HPI:  Anthony Brock is a 59 year old married African-American veteran who works at [employer]. He was referred by Dr. Fanny Dance at [facility] for peripheral vascular evaluation because of lifestyle limiting claudication.I last saw him in the office 02/12/15. He had carotid stenting by myself 07/17/14.[name] He has a long history of tobacco abuse smoking three-quarter pack a day for last 30 years having recently stopped smoking after his last peripheral vascular procedure.. He has never had a heart attack or stroke, and denies chest pain or shortness of breath. He states that he has had left greater than right lower extremity claudication which is lifestyle limiting for several years. Recent Dopplers performed in our office 03/20/14 revealed a right ABI 0.58 with an occluded distal right SFA and a left ABI 0.41 with a high-frequency signal in his left SFA. He underwent angiography and intervention on January 18 revealing a 80% Left internal carotid artery stenosis, and short segment occlusions in both SFAs. I was able to perform directional atherectomy on his left SFA followed by drug eluding balloon angioplasty. He had an excellent angiographic and clinical result. He ultimately underwent staged right SFA intervention with excellent angiographic and clinical result. He underwent elective left internal carotid artery stenting for high-grade (80%) asymptomatic left internal carotid artery stenosis 07/17/14. His recent carotid Dopplers performed 02/07/15 reveals to be widely patent. He does have recurrent claudication with Dopplers performed 02/07/15 revealing high-grade distal bilateral SFA restenosis. He underwent right SFA directional arthrectomy and drug-eluting balloon angioplasty on 02/15/15 with excellent angiographic and clinical result. He underwent  staged left SFA intervention by myself using directional atherectomy and drug-eluting balloon angioplasty on 02/25/15. Dopplers performed 2 weeks later that showed ABIs of 0.9 bilaterally. His claudication has resolved.  Current Outpatient Prescriptions  Medication Sig Dispense Refill  . aspirin EC 81 MG tablet Take 1 tablet (81 mg total) by mouth daily.    [name] atorvastatin (LIPITOR) 40 MG tablet TAKE 1 TABLET BY MOUTH EVERY EVENING 30 tablet 1  . clopidogrel (PLAVIX) 75 MG tablet Take 1 tablet (75 mg total) by mouth daily. 90 tablet 3  . lisinopril (PRINIVIL,ZESTRIL) 5 MG tablet Take 1 tablet (5 mg total) by mouth daily. NEED OV. 30 tablet 1  . LUZU 1 % CREA Apply 1 application topically as needed (athletes foot).      No current facility-administered medications for this visit.    No Known Allergies  Social History   Social History  . Marital Status: Married    Spouse Name: N/A  . Number of Children: N/A  . Years of Education: N/A   Occupational History  . Not on file.   Social History Main Topics  . Smoking status: Current Every Day Smoker -- 0.50 packs/day for 45 years    Types: Cigarettes    Last Attempt to Quit: 04/30/2014  . Smokeless tobacco: Never Used     Comment: 02/25/2015 "stopped smoking 02/14/2015"  . Alcohol Use: Yes     Comment: 02/25/2015 last drink was summer 2016; I've pretty much quit"  . Drug Use: Yes     Comment: "might have used some drugs in my 20's"  . Sexual Activity: Yes   Other Topics Concern  . Not on file   Social History Narrative     Review of Systems: General: negative for chills, fever,  night sweats or weight changes.  Cardiovascular: negative for chest pain, dyspnea on exertion, edema, orthopnea, palpitations, paroxysmal nocturnal dyspnea or shortness of breath Dermatological: negative for rash Respiratory: negative for cough or wheezing Urologic: negative for hematuria Abdominal: negative for nausea, vomiting, diarrhea, bright red  blood per rectum, melena, or hematemesis Neurologic: negative for visual changes, syncope, or dizziness All other systems reviewed and are otherwise negative except as noted above.    Blood pressure 128/70, pulse 77, height 6\' 1"  (1.854 m), weight 194 lb (87.998 kg).  General appearance: alert and no distress Neck: no adenopathy, no carotid bruit, no JVD, supple, symmetrical, trachea midline and thyroid not enlarged, symmetric, no tenderness/mass/nodules Lungs: clear to auscultation bilaterally Heart: regular rate and rhythm, S1, S2 normal, no murmur, click, rub or gallop Extremities: extremities normal, atraumatic, no cyanosis or edema  EKG not performed today  ASSESSMENT AND PLAN:   Tobacco abuse Long history of tobacco abuse recently discontinued after his most recent peripheral vascular intervention  PAD (peripheral artery disease) (H[facility]) History of peripheral arterial disease status post multiple interventions on his right and lower left lower extremities most recently 02/25/15 when [facility], drug eluting balloon angioplasty of his mid left SFA. He had 2 vessel runoff. His follow-up Dopplers performed 03/13/15 revealed ABIs in the 0.9 range bilaterally. His claudication has resolved.  Hyperlipidemia with target LDL less than 70 History of hyperlipidemia on atorvastatin 40 with recent lipid profile performed 04/24/14 revealing total cholesterol 74, LDL 82 and HDL of 36  Essential hypertension History of hypertension blood pressure measured at 120/70. He is on lisinopril 5 mg a day. Continued current meds at current dosing      [name] MD [facility]FACP,FACC,FAHA, [facility]AI 03/19/2015 2:51 PM

## 2015-03-19 NOTE — Assessment & Plan Note (Signed)
History of peripheral arterial disease status post multiple interventions on his right and lower left lower extremities most recently 02/25/15 when [facility]  [facility], drug eluting balloon angioplasty of his mid left SFA. He had 2 vessel runoff. His follow-up Dopplers performed 03/13/15 revealed ABIs in the 0.9 range bilaterally. His claudication has resolved.

## 2015-03-19 NOTE — Patient Instructions (Addendum)
Medication Instructions:  Your physician has recommended you make the following change in your medication:  1) Take Sildenafil 100 mg (five 20 mg tablets) by mouth as needed 2) Take Plavix 75 mg by mouth ONCE daily   Labwork: none  Testing/Procedures: Your physician has requested that you have a lower extremity arterial doppler- During this test, ultrasound is used to evaluate arterial blood flow in the legs. Allow approximately one hour for this exam. Schedule for 6 months  Follow-Up: Your physician wants you to follow-up in: 12 months with Dr. Allyson SabalBerry. You will receive a reminder letter in the mail two months in advance. If you don't receive a letter, please call our office to schedule the follow-up appointment.   Any Other Special Instructions Will Be Listed Below (If Applicable).     If you need a refill on your cardiac medications before your next appointment, please call your pharmacy.

## 2015-03-19 NOTE — Assessment & Plan Note (Signed)
History of hyperlipidemia on atorvastatin 40 with recent lipid profile performed 04/24/14 revealing total cholesterol 74, LDL 82 and HDL of 36

## 2015-03-19 NOTE — Assessment & Plan Note (Signed)
Long history of tobacco abuse recently discontinued after his most recent peripheral vascular intervention

## 2015-03-20 ENCOUNTER — Telehealth: Payer: Self-pay | Admitting: Cardiovascular Disease

## 2015-03-20 MED ORDER — VIAGRA 100 MG PO TABS: 100.0000 mg | ORAL_TABLET | Freq: Every day | ORAL | Status: DC | PRN

## 2015-03-20 NOTE — Telephone Encounter (Signed)
Pt form completed and left at front desk for pick up. Pt is aware

## 2015-03-20 NOTE — Telephone Encounter (Signed)
Johnny BridgeMartha is calling in to get a diagnosis code for his Sildenafil prescription. Please f/u with her  Thanks

## 2015-03-20 NOTE — Telephone Encounter (Signed)
Returned call to patient.He stated he needs Dr.Berry to complete 12 questions on return to work fitness form.He will bring form by this morning.

## 2015-03-20 NOTE — Telephone Encounter (Signed)
Pt is calling in to speak with Selena BattenKim about an item that was missed on a form that he needed to turn in to his employer. Please f/u with him to get this matter corrected.   Thanks

## 2015-03-20 NOTE — Telephone Encounter (Signed)
Returned call to [place] with [facility].She stated patient's insurance will not cover generic Viagra.  Spoke to patient he stated he would like brand name viagra sent to [facility] [address].Prescription sent to pharmacy.

## 2015-03-22 ENCOUNTER — Telehealth: Payer: Self-pay | Admitting: Cardiovascular Disease

## 2015-03-22 NOTE — Telephone Encounter (Signed)
No answer & no VM pickup when dialed.

## 2015-03-22 NOTE — Telephone Encounter (Signed)
Pt had procedure on 02-26-15. He went back to work yesterday for the full 8 hours.He have some concerns about the way his shin and leg was feeling.

## 2015-03-25 NOTE — Telephone Encounter (Signed)
No answer at home number.

## 2015-03-28 ENCOUNTER — Telehealth: Payer: Self-pay | Admitting: Cardiovascular Disease

## 2015-03-28 NOTE — Telephone Encounter (Signed)
12.15.16 Received Signed Attending Physician Statement back from Dr Allyson SabalBerry.  Notified patient that forms were completed and signed.  He [facility] and [facility] his copy for pick up.  [facility]. Placed his copy up front for pick up. lp

## 2015-04-03 ENCOUNTER — Telehealth: Payer: Self-pay | Admitting: Cardiovascular Disease

## 2015-04-03 NOTE — Telephone Encounter (Signed)
Spoke to patient. He had recent ABI's showing claudication resolved.  He had been out of work for a period of time, now back to working - He notes his job does require him to stand for 8 hours a day. He is having some occasional sharp pain in legs bilaterally. This happens usually at work and not when at home No problems on weekend when he can rest.  Also notes some mild swelling & "puffy feeling" which occurs during the day. Swelling still noticeable in evening at home, seems to be resolved in AM. He is not SOB. No fatigue, other symptoms.  Wants to know if anything advised. Routed to Dr. Allyson SabalBerry at pt's request.

## 2015-04-03 NOTE — Telephone Encounter (Signed)
No change in Rx 

## 2015-04-05 NOTE — Telephone Encounter (Signed)
Left message for pt of dr berry's recommendations. 

## 2015-04-10 ENCOUNTER — Other Ambulatory Visit: Payer: Self-pay

## 2015-04-10 MED ORDER — LISINOPRIL 5 MG PO TABS: 5.0000 mg | ORAL_TABLET | Freq: Every day | ORAL | Status: DC

## 2015-04-10 NOTE — Telephone Encounter (Signed)
Essential hypertension - Anthony GessJonathan J Berry, MD at 03/19/2015 2:50 PM     Status: Written Related Problem: Essential hypertension   Expand All Collapse All   History of hypertension blood pressure measured at 120/70. He is on lisinopril 5 mg a day. Continued current meds at current dosing

## 2015-04-11 ENCOUNTER — Encounter: Payer: Self-pay | Admitting: Physician Assistant

## 2015-04-11 ENCOUNTER — Ambulatory Visit (INDEPENDENT_AMBULATORY_CARE_PROVIDER_SITE_OTHER): Payer: BLUE CROSS/BLUE SHIELD | Admitting: Physician Assistant

## 2015-04-11 ENCOUNTER — Other Ambulatory Visit: Payer: Self-pay | Admitting: Cardiovascular Disease

## 2015-04-11 VITALS — BP 142/80 | HR 58 | Ht 73.0 in | Wt 195.1 lb

## 2015-04-11 DIAGNOSIS — E785 Hyperlipidemia, unspecified: Secondary | ICD-10-CM | POA: Diagnosis not present

## 2015-04-11 DIAGNOSIS — R9431 Abnormal electrocardiogram [ECG] [EKG]: Secondary | ICD-10-CM | POA: Diagnosis not present

## 2015-04-11 DIAGNOSIS — I10 Essential (primary) hypertension: Secondary | ICD-10-CM | POA: Diagnosis not present

## 2015-04-11 DIAGNOSIS — I739 Peripheral vascular disease, unspecified: Secondary | ICD-10-CM | POA: Diagnosis not present

## 2015-04-11 LAB — LIPID PANEL
CHOLESTEROL: 146 mg/dL (ref 125–200)
HDL: 46 mg/dL (ref >=40)
LDL Cholesterol: 56 mg/dL (ref <130)
TRIGLYCERIDES: 222 mg/dL — AB (ref <150)
Total CHOL/HDL Ratio: 3.2 Ratio (ref <=5.0)
VLDL: 44 mg/dL — ABNORMAL HIGH (ref <30)

## 2015-04-11 LAB — COMPREHENSIVE METABOLIC PANEL
ALBUMIN: 4.9 g/dL (ref 3.6–5.1)
ALK PHOS: 65 U/L (ref 40–115)
ALT: 19 U/L (ref 9–46)
AST: 22 U/L (ref 10–35)
BILIRUBIN TOTAL: 0.6 mg/dL (ref 0.2–1.2)
BUN: 16 mg/dL (ref 7–25)
CALCIUM: 9.6 mg/dL (ref 8.6–10.3)
CO2: 26 mmol/L (ref 20–31)
Chloride: 100 mmol/L (ref 98–110)
Creat: 1.14 mg/dL (ref 0.70–1.25)
Glucose, Bld: 131 mg/dL — ABNORMAL HIGH (ref 65–99)
Potassium: 4.5 mmol/L (ref 3.5–5.3)
Sodium: 136 mmol/L (ref 135–146)
TOTAL PROTEIN: 8 g/dL (ref 6.1–8.1)

## 2015-04-11 NOTE — Progress Notes (Signed)
Cardiology Office Note   Date:  04/11/2015   ID:  Anthony Brock, DOB 08/12/1955, MRN [id number]  PCP:  Tonye PearsonOLITTLE, ROBERT P, MD  Cardiologist:  Dr William HamburgerBerry  Ravina Milner, PA-C   Chief Complaint  Patient presents with  . Follow-up    pt c/o discomfort in bilateral legs after being up all day at work, tingling and pain in toes at night, swelling/puffiness in bilateral legs and feet when he wakes up in the morning//no other Sx.    History of Present Illness: Anthony Brock is a 59 y.o. male with a history of HTN, HL, PAD, carotid stent 07/2014, tob abuse, L-SFA w/ PTA 04/2014 & 02/2015, R-SFA PTA  04/2014 & 02/2015.  Anthony Brock presents for evaluation of leg pain.  Since he has gone back to work after his PCI, he has noticed daytime edema and pain in his feet. The pain is in the ball of his foot, and will develop over the course of the day. He does not have it in the morning. He feels that it is worse with standing in one position. He walks a great deal a great deal at work and is on his feet most of the day. He has had some tingling in his feet but no numbness. They have not been cold.  He also notes some daytime edema. His legs will be puffy at the end of the day and this is a new symptom for him.  He has not had chest pain. He has not had shortness of breath. Other than the pain in his legs, he feels he has had no limitations of his activity.   Past Medical History  Diagnosis Date  . Hyperglycemia   . Essential hypertension   . High cholesterol   . PAD (peripheral artery disease) (HCC)     a. s/p PTA to L SFA with drug-eluting balloon angioplasty 04/30/14. b. balloon angiography of R SFA PTA 05/24/2014. c. Atherectomy w/ PTA R-SFA 02/14/2015; d. Atherectomy w/ drug-eluting PTA L-SFA 02/25/2015  . Carotid arterial disease (HCC)     a. 80% LICA by PV angio 04/2014.  . Presence of stent in L Internal Carotid 07/18/2014     7 mm/9 mm x 30 mm Xact Nitinol Self-Expanding Stent   .  GERD (gastroesophageal reflux disease)   . Arthritis     "left ankle" (02/14/2015)   Past Surgical History  Procedure Laterality Date  . Lower extremity angiogram N/A 04/30/2014    Procedure: LOWER EXTREMITY ANGIOGRAM;  Surgeon: Runell GessJonathan J Berry, MD;  [facility];  Service: Cardiovascular;  Laterality: N/A;  . Carotid angiogram N/A 04/30/2014    Procedure: CAROTID ANGIOGRAM;  Surgeon: Runell GessJonathan J Berry, MD;  [facility];  Service: Cardiovascular;  Laterality: N/A;  . Peripheral vascular catheterization Left 04/30/2014    Procedure: PERIPHERAL VASCULAR ATHERECTOMY;  Surgeon: Runell GessJonathan J Berry, MD;  [facility];  Service: Cardiovascular;  Laterality: Left;  lt SFA  . Transluminal atherectomy femoral artery Right 05/24/2014    SFA/notes 05/24/2014  . Carotid stent insertion N/A 07/17/2014    Procedure: CAROTID STENT INSERTION;  Surgeon: Runell GessJonathan J Berry, MD;  Location: [facility] CATH LAB;   7 mm/9 mm x 30 mm Xact Nitinol Self-Expanding Stent  . Inguinal hernia repair Right 2004  . Lower extremity angiogram Right 02/14/2015  . Peripheral vascular catheterization Bilateral 02/14/2015    Procedure: Lower Extremity Angiography;  Surgeon: Runell GessJonathan J Berry, MD;  [facility];  Service: Cardiovascular;  Laterality: Bilateral;  . Peripheral vascular catheterization Right 02/14/2015    Procedure: Peripheral Vascular Atherectomy;  Surgeon: Runell Gess, MD;  Location: [facility] INVASIVE CV LAB;  Service: Cardiovascular;  Laterality: Right;  SFA PTA DRUG COATED  . Peripheral vascular catheterization Left 02/25/2015    Procedure: Peripheral Vascular Atherectomy;  Surgeon: Runell Gess, MD;  Location: [facility] INVASIVE CV LAB;  Service: Cardiovascular;  Laterality: Left;  PTA    Current Outpatient Prescriptions  Medication Sig Dispense Refill  . aspirin EC 81 MG tablet Take 1 tablet (81 mg total) by mouth daily.    [name] atorvastatin (LIPITOR) 40 MG tablet TAKE 1 TABLET BY MOUTH EVERY  EVENING 30 tablet 1  . clopidogrel (PLAVIX) 75 MG tablet Take 1 tablet (75 mg total) by mouth daily. 90 tablet 3  . lisinopril (PRINIVIL,ZESTRIL) 5 MG tablet Take 1 tablet (5 mg total) by mouth daily. 90 tablet 1  . LUZU 1 % CREA Apply 1 application topically as needed (athletes foot).     [name] VIAGRA 100 MG tablet Take 1 tablet (100 mg total) by mouth daily as needed for erectile dysfunction. 10 tablet 3   No current facility-administered medications for this visit.    Allergies:   Review of patient's allergies indicates no known allergies.    Social History:  The patient  reports that he has been smoking Cigarettes.  He has a 22.5 pack-year smoking history. He has never used smokeless tobacco. He reports that he drinks alcohol. He reports that he uses illicit drugs.   Family History:  The patient's family history includes Alzheimer's disease in his mother and sister; Emphysema in his father.    ROS:  Please see the history of present illness. All other systems are reviewed and negative.    PHYSICAL EXAM: VS:  BP 142/80 mmHg  Pulse 58  Ht  (1.854 m)  Wt 195 lb 1.6 oz (88.497 kg)  BMI 25.75 kg/m2 , BMI Body mass index is 25.75 kg/(m^2). GEN: Well nourished, well developed, male in no acute distress HEENT: normal for age  Neck: no JVD, no carotid bruit, no masses Cardiac: RRR; no murmur, no rubs, or gallops Respiratory:  clear to auscultation bilaterally, normal work of breathing GI: soft, nontender, nondistended, + BS MS: no deformity or atrophy; no edema; distal pulses are 2+ in both upper extremities; he has slightly decreased DP pulses bilaterally but both DP and PT pulses are palpable. Capillary refill is less than 5 seconds bilaterally   Skin: warm and dry, no rash Neuro:  Strength and sensation are intact Psych: euthymic mood, full affect   EKG:  EKG is ordered today. The ekg ordered today demonstrates sinus rhythm with inferior and inferolateral ST changes that are  different from previous ECGs   Recent Labs: 04/24/2014: ALT 18; TSH 1.432 02/26/2015: BUN 10; Creatinine, Ser 1.07; Hemoglobin 12.6*; Platelets 196; Potassium 4.1; Sodium 137    Lipid Panel    Component Value Date/Time   CHOL 174 04/24/2014 1418   TRIG 281* 04/24/2014 1418   HDL 36* 04/24/2014 1418   CHOLHDL 4.8 04/24/2014 1418   VLDL 56* 04/24/2014 1418   LDLCALC 82 04/24/2014 1418     Wt Readings from Last 3 Encounters:  04/11/15 195 lb 1.6 oz (88.497 kg)  03/19/15 194 lb (87.998 kg)  02/26/15 191 lb 12.8 oz (87 kg)     Other studies Reviewed: Additional studies/ records that were reviewed today include:  Office records, hospital records and ECGs.  ASSESSMENT AND PLAN:  1.  PAD: His ABIs were 0.9 after his most recent intervention. He still has good distal pulses and capillary refill and I do not believe that has changed significantly. He is encouraged to get well padded insoles on his shoes and wear support stockings. He feels he will be able to use the prescription support stockings, we will prescribe them. This was explained to the patient and his wife.  2. Abnormal ECG: He has multiple cardiac risk factors including vascular disease. His ECG is abnormal and is different from previous ECGs. I discussed the situation with the patient has wife and recommended an exercise Myoview. They're in agreement and we will get this at the first available slot.   Current medicines are reviewed at length with the patient today.  The patient does not have concerns regarding medicines.  The following changes have been made:  no change  Labs/ tests ordered today include:   Orders Placed This Encounter  Procedures  . Comprehensive Metabolic Panel (CMET)  . Lipid Profile  . Myocardial Perfusion Imaging  . EKG 12-Lead     Disposition:   FU with Dr. Allyson Sabal  Signed, Leanna Battles  04/11/2015 10:34 AM    [facility] [address] [place], [place], [place]   [address] Phone: [phone]; Fax: [phone]

## 2015-04-11 NOTE — Telephone Encounter (Signed)
Rx request sent to pharmacy.  

## 2015-04-11 NOTE — Patient Instructions (Addendum)
Medication Instructions:  Your physician recommends that you continue on your current medications as directed. Please refer to the Current Medication list given to you today.  Labwork: Your physician recommends that you have lab work today. Lipid and CMET   Testing/Procedures: Your physician has requested that you have en exercise stress myoview today if possible. For further information please visit [email or web address]. Please follow instruction sheet, as given.  Follow-Up: Your physician wants you to follow-up in: first available with Dr. Allyson SabalBerry.   If your stress test is abnormal you will be scheduled to see Theodore Demarkhonda Barrett PA as soon as possible.  Over the counter compression stockings.  As long as you are feeling asymptomatic it is okay for you to go to work.   If you need a refill on your cardiac medications before your next appointment, please call your pharmacy.  Exercise Stress Electrocardiogram An exercise stress electrocardiogram is a test to check how blood flows to your heart. It is done to find areas of poor blood flow. You will need to walk on a treadmill for this test. The electrocardiogram will record your heartbeat when you are at rest and when you are exercising. BEFORE THE PROCEDURE  Do not have drinks with caffeine or foods with caffeine for 24 hours before the test, or as told by your doctor. This includes coffee, tea (even decaf tea), sodas, chocolate, and cocoa.  Follow your doctor's instructions about eating and drinking before the test.  Ask your doctor what medicines you should or should not take before the test. Take your medicines with water unless told by your doctor not to.  If you use an inhaler, bring it with you to the test.  Bring a snack to eat after the test.  Do not  smoke for 4 hours before the test.  Do not put lotions, powders, creams, or oils on your chest before the test.  Wear comfortable shoes and clothing. PROCEDURE  You will have  patches put on your chest. Small areas of your chest may need to be shaved. Wires will be connected to the patches.  Your heart rate will be watched while you are resting and while you are exercising.  You will walk on the treadmill. The treadmill will slowly get faster to raise your heart rate.  The test will take about 1-2 hours. AFTER THE PROCEDURE  Your heart rate and blood pressure will be watched after the test.  You may return to your normal diet, activities, and medicines or as told by your doctor.   This information is not intended to replace advice given to you by your health care provider. Make sure you discuss any questions you have with your health care provider.   Document Released: 09/16/2007 Document Revised: 04/20/2014 Document Reviewed: 12/05/2012 Elsevier Interactive Patient Education [employer]2016 Elsevier Inc.

## 2015-04-12 ENCOUNTER — Ambulatory Visit (HOSPITAL_COMMUNITY)
Admission: RE | Admit: 2015-04-12 | Discharge: 2015-04-12 | Disposition: A | Discharge status: Home / Self Care | Payer: BLUE CROSS/BLUE SHIELD | Source: Ambulatory Visit | Attending: Cardiovascular Disease | Admitting: Cardiovascular Disease

## 2015-04-12 DIAGNOSIS — I10 Essential (primary) hypertension: Secondary | ICD-10-CM | POA: Diagnosis not present

## 2015-04-12 DIAGNOSIS — R9431 Abnormal electrocardiogram [ECG] [EKG]: Secondary | ICD-10-CM | POA: Diagnosis not present

## 2015-04-12 DIAGNOSIS — I739 Peripheral vascular disease, unspecified: Secondary | ICD-10-CM | POA: Diagnosis not present

## 2015-04-12 DIAGNOSIS — I779 Disorder of arteries and arterioles, unspecified: Secondary | ICD-10-CM | POA: Diagnosis not present

## 2015-04-12 DIAGNOSIS — Z8249 Family history of ischemic heart disease and other diseases of the circulatory system: Secondary | ICD-10-CM | POA: Insufficient documentation

## 2015-04-12 DIAGNOSIS — F17200 Nicotine dependence, unspecified, uncomplicated: Secondary | ICD-10-CM | POA: Diagnosis not present

## 2015-04-12 DIAGNOSIS — R9439 Abnormal result of other cardiovascular function study: Secondary | ICD-10-CM | POA: Insufficient documentation

## 2015-04-12 LAB — MYOCARDIAL PERFUSION IMAGING
CHL CUP NUCLEAR SDS: 2
CHL CUP NUCLEAR SRS: 1
CHL CUP NUCLEAR SSS: 3
CHL CUP RESTING HR STRESS: 58 {beats}/min
LV sys vol: 76 mL
LVDIAVOL: 131 mL
NUC STRESS TID: 1.27
Peak HR: 65 {beats}/min

## 2015-04-12 MED ORDER — TECHNETIUM TC 99M SESTAMIBI GENERIC - CARDIOLITE
30.3000 | Freq: Once | INTRAVENOUS | Status: AC | PRN
  Administered 2015-04-12: 30.3 via INTRAVENOUS

## 2015-04-12 MED ORDER — REGADENOSON 0.4 MG/5ML IV SOLN
0.4000 mg | Freq: Once | INTRAVENOUS | Status: AC
  Administered 2015-04-12: 0.4 mg via INTRAVENOUS

## 2015-04-12 MED ORDER — TECHNETIUM TC 99M SESTAMIBI GENERIC - CARDIOLITE
10.4000 | Freq: Once | INTRAVENOUS | Status: AC | PRN
  Administered 2015-04-12: 10.4 via INTRAVENOUS

## 2015-04-17 ENCOUNTER — Telehealth: Payer: Self-pay | Admitting: Cardiovascular Disease

## 2015-04-17 DIAGNOSIS — R601 Generalized edema: Secondary | ICD-10-CM

## 2015-04-17 NOTE — Telephone Encounter (Signed)
Pending result notes.

## 2015-04-17 NOTE — Telephone Encounter (Signed)
Pt would like his stress test results from Friday. He would also like to talk to Dr Allyson SabalBerry or Theodore Demarkhonda Barrett.If you get no answer,please call him on-[phone].

## 2015-04-18 NOTE — Telephone Encounter (Signed)
Reviewed results of testing with the patient. He was the compression stockings ordered and we will do so. He is otherwise doing well and not having any chest pain or shortness of breath. I advised I would let Dr. Gery PrayBarry no because of his heart muscle being a little weak and we will get back to him if Dr. Allyson SabalBerry wished any further workup. I advised that he could continue any activity that he felt well doing. I advised he should not overexert himself and he stated he would not.  Anthony Brock, Anthony Brock, Cordelia Poche-C 04/18/2015 5:27 PM Beeper [phone]

## 2015-04-18 NOTE — Addendum Note (Signed)
Addended by: Theressa StampsHECKER, Zimir Kittleson N on: 04/18/2015 05:49 PM   Modules accepted: Orders

## 2015-04-19 ENCOUNTER — Telehealth: Payer: Self-pay

## 2015-04-19 NOTE — Telephone Encounter (Signed)
Left message for pt to know that Rx for compression stocking has been left at front desk.

## 2015-04-23 ENCOUNTER — Encounter: Payer: Self-pay | Admitting: *Deleted

## 2015-04-23 DIAGNOSIS — Z006 Encounter for examination for normal comparison and control in clinical research program: Secondary | ICD-10-CM

## 2015-04-23 NOTE — Progress Notes (Signed)
Called Mr. Anthony Brock for his 1 year follow-up for the SAFE-DCB trial. He is scheduled to see Dr. Allyson SabalBerry in February and has had an additional peripheral intervention.

## 2015-05-15 ENCOUNTER — Encounter: Payer: Self-pay | Admitting: Cardiovascular Disease

## 2015-05-15 ENCOUNTER — Ambulatory Visit (INDEPENDENT_AMBULATORY_CARE_PROVIDER_SITE_OTHER): Payer: BLUE CROSS/BLUE SHIELD | Admitting: Cardiovascular Disease

## 2015-05-15 VITALS — BP 150/80 | HR 74 | Ht 73.0 in | Wt 196.0 lb

## 2015-05-15 DIAGNOSIS — R9431 Abnormal electrocardiogram [ECG] [EKG]: Secondary | ICD-10-CM | POA: Diagnosis not present

## 2015-05-15 DIAGNOSIS — I519 Heart disease, unspecified: Secondary | ICD-10-CM

## 2015-05-15 DIAGNOSIS — Z720 Tobacco use: Secondary | ICD-10-CM

## 2015-05-15 NOTE — Progress Notes (Signed)
Mr. Sultana returns today for follow-up of his Myoview stress test that showed no evidence of ischemia. His EF was in the 40% range. This was performed because of an abnormal EKG which probably represented LVH with repolarization changes. I reassured Mr. Parham. We will check a 2-D echocardiogram. He will check his blood pressures on a daily basis and call us if these are elevated we can titrate his medications. He will see Bjorn Loser Barrett back in 3 months and he back in 6 months

## 2015-05-15 NOTE — Assessment & Plan Note (Signed)
Anthony Brock has what appears to be LVH with repolarization abnormalities on EKG that was performed on 04/11/15. Subsequent Myoview stress test showed no evidence of ischemia with an EF in the 40% range. I suspect he has hypertensive heart disease. We will recheck a 2-D echocardiogram.

## 2015-05-15 NOTE — Patient Instructions (Signed)
Medication Instructions:  Your physician recommends that you continue on your current medications as directed. Please refer to the Current Medication list given to you today.   Labwork: none  Testing/Procedures: Your physician has requested that you have an echocardiogram. Echocardiography is a painless test that uses sound waves to create images of your heart. It provides your doctor with information about the size and shape of your heart and how well your heart's chambers and valves are working. This procedure takes approximately one hour. There are no restrictions for this procedure.   Follow-Up: We request that you follow-up in: 3 months with Anthony Demark, PA with an extender and in 6 months with Dr San Morelle will receive a reminder letter in the mail two months in advance. If you don't receive a letter, please call our office to schedule the follow-up appointment.    Any Other Special Instructions Will Be Listed Below (If Applicable).     If you need a refill on your cardiac medications before your next appointment, please call your pharmacy.

## 2015-05-15 NOTE — Assessment & Plan Note (Signed)
History of tobacco abuse currently discontinued

## 2015-05-22 ENCOUNTER — Other Ambulatory Visit: Payer: Self-pay | Admitting: Cardiovascular Disease

## 2015-05-22 NOTE — Telephone Encounter (Signed)
Rx(s) sent to pharmacy electronically.  

## 2015-05-24 ENCOUNTER — Ambulatory Visit (HOSPITAL_COMMUNITY): Payer: BLUE CROSS/BLUE SHIELD | Attending: Internal Medicine

## 2015-05-24 ENCOUNTER — Other Ambulatory Visit: Payer: Self-pay

## 2015-05-24 DIAGNOSIS — I739 Peripheral vascular disease, unspecified: Secondary | ICD-10-CM | POA: Diagnosis not present

## 2015-05-24 DIAGNOSIS — I10 Essential (primary) hypertension: Secondary | ICD-10-CM | POA: Diagnosis not present

## 2015-05-24 DIAGNOSIS — I519 Heart disease, unspecified: Secondary | ICD-10-CM | POA: Diagnosis not present

## 2015-05-24 DIAGNOSIS — I77810 Thoracic aortic ectasia: Secondary | ICD-10-CM | POA: Diagnosis not present

## 2015-05-24 DIAGNOSIS — I340 Nonrheumatic mitral (valve) insufficiency: Secondary | ICD-10-CM | POA: Insufficient documentation

## 2015-05-24 DIAGNOSIS — E785 Hyperlipidemia, unspecified: Secondary | ICD-10-CM | POA: Diagnosis not present

## 2015-05-24 DIAGNOSIS — F17200 Nicotine dependence, unspecified, uncomplicated: Secondary | ICD-10-CM | POA: Insufficient documentation

## 2015-06-11 ENCOUNTER — Telehealth: Payer: Self-pay | Admitting: Cardiovascular Disease

## 2015-06-11 NOTE — Telephone Encounter (Signed)
Pt having tingling in his feet and toes at the end of his work day after being on his feet all day. Pt stated he stopped wearing is compression socks a few weeks ago as he didn't think they were doing anything. Pt instructed to start back wearing his compression sock daily and elevated his feet at night. Pt verbalized understanding and no additional questions at this time.

## 2015-06-11 NOTE — Telephone Encounter (Signed)
New message      Pt is back at work.  He states he is feeling pain and tingling in both feet and toes.  Pt stands all day and wears steel toe shoes.  Talk to a nurse.  Please call after 3:30.

## 2015-07-16 ENCOUNTER — Encounter (HOSPITAL_COMMUNITY): Payer: Self-pay

## 2015-07-16 ENCOUNTER — Ambulatory Visit (HOSPITAL_COMMUNITY): Admit: 2015-07-16 | Payer: Self-pay | Admitting: Interventional Cardiology

## 2015-07-16 ENCOUNTER — Encounter (HOSPITAL_COMMUNITY): Payer: Self-pay | Admitting: *Deleted

## 2015-07-16 ENCOUNTER — Emergency Department (HOSPITAL_COMMUNITY)
Admission: EM | Admit: 2015-07-16 | Discharge: 2015-07-16 | Disposition: A | Discharge status: Home / Self Care | Payer: BLUE CROSS/BLUE SHIELD | Attending: Emergency Medicine | Admitting: Emergency Medicine

## 2015-07-16 DIAGNOSIS — Z7982 Long term (current) use of aspirin: Secondary | ICD-10-CM | POA: Insufficient documentation

## 2015-07-16 DIAGNOSIS — I739 Peripheral vascular disease, unspecified: Secondary | ICD-10-CM | POA: Insufficient documentation

## 2015-07-16 DIAGNOSIS — M1990 Unspecified osteoarthritis, unspecified site: Secondary | ICD-10-CM | POA: Diagnosis not present

## 2015-07-16 DIAGNOSIS — Z87891 Personal history of nicotine dependence: Secondary | ICD-10-CM | POA: Diagnosis not present

## 2015-07-16 DIAGNOSIS — R9431 Abnormal electrocardiogram [ECG] [EKG]: Secondary | ICD-10-CM | POA: Diagnosis not present

## 2015-07-16 DIAGNOSIS — I10 Essential (primary) hypertension: Secondary | ICD-10-CM | POA: Insufficient documentation

## 2015-07-16 DIAGNOSIS — Z79899 Other long term (current) drug therapy: Secondary | ICD-10-CM | POA: Diagnosis not present

## 2015-07-16 DIAGNOSIS — Z7902 Long term (current) use of antithrombotics/antiplatelets: Secondary | ICD-10-CM | POA: Insufficient documentation

## 2015-07-16 DIAGNOSIS — E1165 Type 2 diabetes mellitus with hyperglycemia: Secondary | ICD-10-CM | POA: Diagnosis not present

## 2015-07-16 DIAGNOSIS — Z8719 Personal history of other diseases of the digestive system: Secondary | ICD-10-CM | POA: Insufficient documentation

## 2015-07-16 DIAGNOSIS — E7800 Pure hypercholesterolemia, unspecified: Secondary | ICD-10-CM | POA: Diagnosis not present

## 2015-07-16 DIAGNOSIS — R531 Weakness: Secondary | ICD-10-CM | POA: Diagnosis present

## 2015-07-16 LAB — CBC WITH DIFFERENTIAL/PLATELET
BASOS ABS: 0 10*3/uL (ref 0.0–0.1)
Basophils Relative: 0 %
EOS ABS: 0.1 10*3/uL (ref 0.0–0.7)
EOS PCT: 2 %
HCT: 37.6 % — ABNORMAL LOW (ref 39.0–52.0)
Hemoglobin: 13 g/dL (ref 13.0–17.0)
LYMPHS PCT: 52 %
Lymphs Abs: 2.4 10*3/uL (ref 0.7–4.0)
MCH: 28.6 pg (ref 26.0–34.0)
MCHC: 34.6 g/dL (ref 30.0–36.0)
MCV: 82.8 fL (ref 78.0–100.0)
MONO ABS: 0.3 10*3/uL (ref 0.1–1.0)
Monocytes Relative: 7 %
Neutro Abs: 1.8 10*3/uL (ref 1.7–7.7)
Neutrophils Relative %: 39 %
PLATELETS: 203 10*3/uL (ref 150–400)
RBC: 4.54 MIL/uL (ref 4.22–5.81)
RDW: 11.4 % — AB (ref 11.5–15.5)
WBC: 4.6 10*3/uL (ref 4.0–10.5)

## 2015-07-16 LAB — CBG MONITORING, ED
GLUCOSE-CAPILLARY: 278 mg/dL — AB (ref 65–99)
Glucose-Capillary: 260 mg/dL — ABNORMAL HIGH (ref 65–99)

## 2015-07-16 LAB — BASIC METABOLIC PANEL
Anion gap: 11 (ref 5–15)
BUN: 15 mg/dL (ref 6–20)
CALCIUM: 9.7 mg/dL (ref 8.9–10.3)
CO2: 27 mmol/L (ref 22–32)
CREATININE: 1.15 mg/dL (ref 0.61–1.24)
Chloride: 97 mmol/L — ABNORMAL LOW (ref 101–111)
GFR calc Af Amer: >60 mL/min (ref >60)
GLUCOSE: 354 mg/dL — AB (ref 65–99)
POTASSIUM: 4.7 mmol/L (ref 3.5–5.1)
SODIUM: 135 mmol/L (ref 135–145)

## 2015-07-16 LAB — URINALYSIS, ROUTINE W REFLEX MICROSCOPIC
Bilirubin Urine: NEGATIVE
HGB URINE DIPSTICK: NEGATIVE
Ketones, ur: NEGATIVE mg/dL
Leukocytes, UA: NEGATIVE
Nitrite: NEGATIVE
PH: 6 (ref 5.0–8.0)
Protein, ur: NEGATIVE mg/dL
SPECIFIC GRAVITY, URINE: 1.035 — AB (ref 1.005–1.030)

## 2015-07-16 LAB — I-STAT VENOUS BLOOD GAS, ED
ACID-BASE EXCESS: 3 mmol/L — AB (ref 0.0–2.0)
Bicarbonate: 29.7 mEq/L — ABNORMAL HIGH (ref 20.0–24.0)
O2 SAT: 49 %
PCO2 VEN: 53.2 mmHg — AB (ref 45.0–50.0)
PO2 VEN: 28 mmHg — AB (ref 31.0–45.0)
TCO2: 31 mmol/L (ref 0–100)
pH, Ven: 7.355 — ABNORMAL HIGH (ref 7.250–7.300)

## 2015-07-16 LAB — I-STAT CG4 LACTIC ACID, ED: LACTIC ACID, VENOUS: 1.63 mmol/L (ref 0.5–2.0)

## 2015-07-16 LAB — I-STAT TROPONIN, ED
Troponin i, poc: 0.05 ng/mL (ref 0.00–0.08)
Troponin i, poc: 0.06 ng/mL (ref 0.00–0.08)

## 2015-07-16 LAB — URINE MICROSCOPIC-ADD ON: RBC / HPF: NONE SEEN RBC/hpf (ref 0–5)

## 2015-07-16 SURGERY — LEFT HEART CATH AND CORONARY ANGIOGRAPHY

## 2015-07-16 MED ORDER — SODIUM CHLORIDE 0.9 % IV BOLUS (SEPSIS)
1000.0000 mL | Freq: Once | INTRAVENOUS | Status: AC
  Administered 2015-07-16: 1000 mL via INTRAVENOUS

## 2015-07-16 MED ORDER — METFORMIN HCL 500 MG PO TABS: 500.0000 mg | ORAL_TABLET | Freq: Two times a day (BID) | ORAL | Status: DC

## 2015-07-16 NOTE — ED Notes (Signed)
Cancelled code stemi, per Dr. Katrinka BlazingSmith.

## 2015-07-16 NOTE — ED Provider Notes (Signed)
CSN: [id number]     Arrival date & time 07/16/15  1208 History   First MD Initiated Contact with Patient 07/16/15 1210     Chief Complaint  Patient presents with  . Abnormal ECG     (Consider location/radiation/quality/duration/timing/severity/associated sxs/prior Treatment) HPI Comments: 60 year old male with past medical history including PAD, carotid artery stenting, hypertension, hyperlipidemia who presents with weakness. Patient states that for the past several weeks he has had generalized weakness and fatigue. His symptoms got much worse 3 days ago and he had a few episodes of nausea and vomiting. He made an appointment with his PCP yesterday and had blood work drawn at that time. He was called back today due to hyperglycemia in the 500s and troponin 0.05. EMS noted an abnormal EKG and a code STEMI was called in transport. The patient denies any chest pain, shortness of breath, abdominal pain, or diaphoresis. No fevers or recent illness. He has felt dehydrated recently and has been drinking more. He denies any history of diabetes.  The history is provided by the patient.    Past Medical History  Diagnosis Date  . Hyperglycemia   . Essential hypertension   . High cholesterol   . PAD (peripheral artery disease) (HCC)     a. s/p PTA to L SFA with drug-eluting balloon angioplasty 04/30/14. b. balloon angiography of R SFA PTA 05/24/2014. c. Atherectomy w/ PTA R-SFA 02/14/2015; d. Atherectomy w/ drug-eluting PTA L-SFA 02/25/2015  . Carotid arterial disease ([place])     a. 81% LICA by PV angio 05/7515.  . Presence of stent in L Internal Carotid 07/18/2014     7 mm/9 mm x 30 mm Xact Nitinol Self-Expanding Stent   . GERD (gastroesophageal reflux disease)   . Arthritis     "left ankle" (02/14/2015)  . Abnormal EKG    Past Surgical History  Procedure Laterality Date  . Lower extremity angiogram N/A 04/30/2014    Procedure: LOWER EXTREMITY ANGIOGRAM;  Surgeon: Lorretta Harp, MD;  Location: [facility]  CATH LAB;  Service: Cardiovascular;  Laterality: N/A;  . Carotid angiogram N/A 04/30/2014    Procedure: CAROTID ANGIOGRAM;  Surgeon: Lorretta Harp, MD;  Location: [facility] CATH LAB;  Service: Cardiovascular;  Laterality: N/A;  . Peripheral vascular catheterization Left 04/30/2014    Procedure: PERIPHERAL VASCULAR ATHERECTOMY;  Surgeon: Lorretta Harp, MD;  Location: [facility] CATH LAB;  Service: Cardiovascular;  Laterality: Left;  lt SFA  . Transluminal atherectomy femoral artery Right 05/24/2014    SFA/notes 05/24/2014  . Carotid stent insertion N/A 07/17/2014    Procedure: CAROTID STENT INSERTION;  Surgeon: Lorretta Harp, MD;  Location: [place] CATH LAB;   7 mm/9 mm x 30 mm Xact Nitinol Self-Expanding Stent  . Inguinal hernia repair Right 2004  . Lower extremity angiogram Right 02/14/2015  . Peripheral vascular catheterization Bilateral 02/14/2015    Procedure: Lower Extremity Angiography;  Surgeon: Lorretta Harp, MD;  Location: [place] CV LAB;  Service: Cardiovascular;  Laterality: Bilateral;  . Peripheral vascular catheterization Right 02/14/2015    Procedure: Peripheral Vascular Atherectomy;  Surgeon: Lorretta Harp, MD;  Location: [place] CV LAB;  Service: Cardiovascular;  Laterality: Right;  SFA PTA DRUG COATED  . Peripheral vascular catheterization Left 02/25/2015    Procedure: Peripheral Vascular Atherectomy;  Surgeon: Lorretta Harp, MD;  Location: [place] CV LAB;  Service: Cardiovascular;  Laterality: Left;  PTA   Family History  Problem Relation Age of Onset  . Alzheimer's disease Mother   .  Emphysema Father   . Alzheimer's disease Sister    Social History  Substance Use Topics  . Smoking status: Former Smoker -- 0.50 packs/day for 45 years    Types: Cigarettes    Quit date: 04/30/2014  . Smokeless tobacco: Never Used     Comment: 02/25/2015 "stopped smoking 02/14/2015"  . Alcohol Use: 0.0 oz/week    0 Standard drinks or equivalent per week     Comment: 02/25/2015 last  drink was summer 2016; I've pretty much quit"    Review of Systems 10 Systems reviewed and are negative for acute change except as noted in the HPI.  Allergies  Review of patient's allergies indicates no known allergies.  Home Medications   Prior to Admission medications   Medication Sig Start Date End Date Taking? Authorizing Provider  aspirin EC 81 MG tablet Take 1 tablet (81 mg total) by mouth daily. 07/18/14  Yes Luke K Kilroy, PA-C  atorvastatin (LIPITOR) 40 MG tablet TAKE 1 TABLET BY MOUTH EVERY EVENING 05/22/15  Yes Lorretta Harp, MD  clopidogrel (PLAVIX) 75 MG tablet Take 1 tablet (75 mg total) by mouth daily. 03/19/15  Yes Lorretta Harp, MD  lisinopril (PRINIVIL,ZESTRIL) 5 MG tablet TAKE 1 TABLET BY MOUTH DAILY *NEEDS OFFICE VISIT* 04/11/15  Yes Lorretta Harp, MD  LUZU 1 % CREA Apply 1 application topically as needed (athletes foot).  04/02/14  Yes Historical Provider, MD  VIAGRA 100 MG tablet Take 1 tablet (100 mg total) by mouth daily as needed for erectile dysfunction. 03/20/15  Yes Lorretta Harp, MD  metFORMIN (GLUCOPHAGE) 500 MG tablet Take 1 tablet (500 mg total) by mouth 2 (two) times daily with a meal. 07/16/15   Sharlett Iles, MD   BP 131/73 mmHg  Pulse 54  Temp(Src) 97.9 F (36.6 C) (Oral)  Resp 16  SpO2 96% Physical Exam  Constitutional: He is oriented to person, place, and time. He appears well-developed and well-nourished. No distress.  HENT:  Head: Normocephalic and atraumatic.  Mildly dry mucous membranes  Eyes: Conjunctivae are normal. Pupils are equal, round, and reactive to light.  Neck: Neck supple.  Cardiovascular: Normal rate, regular rhythm and normal heart sounds.   No murmur heard. Pulmonary/Chest: Effort normal and breath sounds normal.  Abdominal: Soft. Bowel sounds are normal. He exhibits no distension. There is no tenderness.  Musculoskeletal: He exhibits no edema.  Neurological: He is alert and oriented to person, place, and  time.  Fluent speech  Skin: Skin is warm and dry.  Psychiatric: He has a normal mood and affect. Judgment normal.  Nursing note and vitals reviewed.   ED Course  Procedures (including critical care time) Labs Review Labs Reviewed  URINALYSIS, ROUTINE W REFLEX MICROSCOPIC (NOT AT [facility]) - Abnormal; Notable for the following:    Specific Gravity, Urine 1.035 (*)    Glucose, UA >1000 (*)    All other components within normal limits  URINE MICROSCOPIC-ADD ON - Abnormal; Notable for the following:    Squamous Epithelial / LPF 0-5 (*)    Bacteria, UA RARE (*)    All other components within normal limits  BASIC METABOLIC PANEL - Abnormal; Notable for the following:    Chloride 97 (*)    Glucose, Bld 354 (*)    All other components within normal limits  CBC WITH DIFFERENTIAL/PLATELET - Abnormal; Notable for the following:    HCT 37.6 (*)    RDW 11.4 (*)    All other components within  normal limits  I-STAT VENOUS BLOOD GAS, ED - Abnormal; Notable for the following:    pH, Ven 7.355 (*)    pCO2, Ven 53.2 (*)    pO2, Ven 28.0 (*)    Bicarbonate 29.7 (*)    Acid-Base Excess 3.0 (*)    All other components within normal limits  CBG MONITORING, ED - Abnormal; Notable for the following:    Glucose-Capillary 260 (*)    All other components within normal limits  CBG MONITORING, ED - Abnormal; Notable for the following:    Glucose-Capillary 278 (*)    All other components within normal limits  I-STAT CG4 LACTIC ACID, ED  I-STAT TROPOININ, ED  I-STAT TROPOININ, ED    Imaging Review No results found. I have personally reviewed and evaluated these lab results as part of my medical decision-making.   EKG Interpretation   Date/Time:  Tuesday July 16 2015 12:15:42 EDT Ventricular Rate:  55 PR Interval:  146 QRS Duration: 98 QT Interval:  446 QTC Calculation: 427 R Axis:   66 Text Interpretation:  Sinus rhythm Nonspecific T abnormalities, inferior  leads Borderline ST elevation,  anterior leads No significant change since  last tracing Confirmed by LITTLE MD, RACHEL [phone]) on 07/16/2015 12:22:02  PM     Medications  sodium chloride 0.9 % bolus 1,000 mL (0 mLs Intravenous Stopped 07/16/15 1445)    MDM   Final diagnoses:  Type 2 diabetes mellitus with hyperglycemia, without long-term current use of insulin ([place])   Patient presents with several weeks of generalized weakness that have worsened over the past few days. He was brought in by EMS due to abnormal lab work yesterday at PCP office and code STEMI was called in transport due to abnormal EKG. On arrival, the patient was awake and alert, well-appearing. Vital signs notable for hypertension. Cardiology met the patient in the ED and evaluated his EKG here. They determined that his EKG is similar to previous and given that he is having no chest pain or shortness of breath, they canceled code STEMI. Obtained above lab work and gave the patient an IV fluid bolus.  Labwork shows hyperglycemia without DKA or evidence of dehydration. Repeat glucose after IV fluids is 278. Given that the patient has complained only of weakness without any other symptoms, I feel he can be discharged home with PCP follow-up. I provided metformin for him to start on and instructed to follow-up this week with his PCP. Return precautions reviewed and patient voiced understanding. Patient discharged in satisfactory condition.   Sharlett Iles, MD 07/16/15 [phone]

## 2015-07-16 NOTE — ED Notes (Signed)
MD at bedside. 

## 2015-07-16 NOTE — ED Notes (Signed)
Per EMS- pt was at his MDs for fatigue since Saturday. Pt had an EKG done and noted to have elevation. However after cards reviewed the EKG it was found to be the same as past. Pt CBG 583. No hx of diabetes. Denies pain. NAD noted.

## 2015-07-18 ENCOUNTER — Encounter: Payer: Self-pay | Admitting: Physician Assistant

## 2015-07-18 ENCOUNTER — Ambulatory Visit (INDEPENDENT_AMBULATORY_CARE_PROVIDER_SITE_OTHER): Payer: BLUE CROSS/BLUE SHIELD | Admitting: Physician Assistant

## 2015-07-18 VITALS — BP 140/70 | HR 68 | Ht 73.0 in | Wt 185.0 lb

## 2015-07-18 DIAGNOSIS — I739 Peripheral vascular disease, unspecified: Secondary | ICD-10-CM | POA: Diagnosis not present

## 2015-07-18 DIAGNOSIS — R739 Hyperglycemia, unspecified: Secondary | ICD-10-CM | POA: Diagnosis not present

## 2015-07-18 DIAGNOSIS — E119 Type 2 diabetes mellitus without complications: Secondary | ICD-10-CM | POA: Diagnosis not present

## 2015-07-18 DIAGNOSIS — I10 Essential (primary) hypertension: Secondary | ICD-10-CM

## 2015-07-18 MED ORDER — DILTIAZEM HCL ER COATED BEADS 120 MG PO CP24: 120.0000 mg | ORAL_CAPSULE | Freq: Every day | ORAL | Status: DC

## 2015-07-18 NOTE — Progress Notes (Signed)
Cardiology Office Note   Date:  07/18/2015   ID:  Anthony Brock, DOB 04/10/1956, MRN [id number]  PCP:  Dois Davenport., MD  Cardiologist:  Dr William Hamburger, PA-C   Chief Complaint  Patient presents with  . Follow-up    fatigue, tingling and numbness in feet    History of Present Illness: Anthony Brock is a 60 y.o. male with a history of HTN, HL, PAD, carotid stent 07/2014, tob abuse, L-SFA w/ PTA 04/2014 & 02/2015, R-SFA PTA 04/2014 & 02/2015.  ECG abnl but MV showed likely diaphragmatic attenuation, no ischemia, EF 42%, low-risk study. Dr Allyson Sabal reviewed data and no further workup indicated.  Seen in ER 04/04 for weakness and fatigue. CBG recently >500, trop 0.05. (STEMI called but ECG no new changes).  Anthony Brock presents for Follow-up of his cardiac issues.  He has not had chest pain or shortness of breath but he has several issues.  1. He is still having problems with tingling in his legs. 2. He is out of work for a couple of weeks and is very relieved by that. He is struggling at work because he becomes extremely tired and is not able to do as much as he could previously once he gets home from work. He admits to fatigue, and feeling worn out by the end of the week. 3. He is concerned about the new diagnosis of diabetes, and wonders if we have information on diabetic diet. He and his wife admit that they're diet has been poor in the past but they're willing to make changes. 4. He wants to make sure his blood pressures under good control and is concerned because of the spikes that he has had with the stress test and when he was in the hospital, with blood pressure going very high.   Past Medical History  Diagnosis Date  . Hyperglycemia   . Essential hypertension   . High cholesterol   . PAD (peripheral artery disease) (HCC)     a. s/p PTA to L SFA with drug-eluting balloon angioplasty 04/30/14. b. balloon angiography of R SFA PTA 05/24/2014. c. Atherectomy w/  PTA R-SFA 02/14/2015; d. Atherectomy w/ drug-eluting PTA L-SFA 02/25/2015  . Carotid arterial disease (HCC)     a. 80% LICA by PV angio 04/2014.  . Presence of stent in L Internal Carotid 07/18/2014     7 mm/9 mm x 30 mm Xact Nitinol Self-Expanding Stent   . GERD (gastroesophageal reflux disease)   . Arthritis     "left ankle" (02/14/2015)  . Abnormal EKG     Past Surgical History  Procedure Laterality Date  . Lower extremity angiogram N/A 04/30/2014    Procedure: LOWER EXTREMITY ANGIOGRAM;  Surgeon: Runell Gess, MD;  Location: [facility] CATH LAB;  Service: Cardiovascular;  Laterality: N/A;  . Carotid angiogram N/A 04/30/2014    Procedure: CAROTID ANGIOGRAM;  Surgeon: Runell Gess, MD;  Location: [facility] CATH LAB;  Service: Cardiovascular;  Laterality: N/A;  . Peripheral vascular catheterization Left 04/30/2014    Procedure: PERIPHERAL VASCULAR ATHERECTOMY;  Surgeon: Runell Gess, MD;  Location: [facility] CATH LAB;  Service: Cardiovascular;  Laterality: Left;  lt SFA  . Transluminal atherectomy femoral artery Right 05/24/2014    SFA/notes 05/24/2014  . Carotid stent insertion N/A 07/17/2014    Procedure: CAROTID STENT INSERTION;  Surgeon: Runell Gess, MD;  Location: [facility] CATH LAB;   7 mm/9 mm x 30 mm Xact Nitinol Self-Expanding Stent  .  Inguinal hernia repair Right 2004  . Lower extremity angiogram Right 02/14/2015  . Peripheral vascular catheterization Bilateral 02/14/2015    Procedure: Lower Extremity Angiography;  Surgeon: Runell GessJonathan J Berry, MD;  [facility];  Service: Cardiovascular;  Laterality: Bilateral;  . Peripheral vascular catheterization Right 02/14/2015    Procedure: Peripheral Vascular Atherectomy;  Surgeon: Runell GessJonathan J Berry, MD;  [facility];  Service: Cardiovascular;  Laterality: Right;  SFA PTA DRUG COATED  . Peripheral vascular catheterization Left 02/25/2015    Procedure: Peripheral Vascular Atherectomy;  Surgeon: Runell GessJonathan J Berry, MD;  [facility];  Service: Cardiovascular;  Laterality: Left;  PTA    Current Outpatient Prescriptions  Medication Sig Dispense Refill  . aspirin EC 81 MG tablet Take 1 tablet (81 mg total) by mouth daily.    [name]. atorvastatin (LIPITOR) 40 MG tablet TAKE 1 TABLET BY MOUTH EVERY EVENING 30 tablet 6  . clopidogrel (PLAVIX) 75 MG tablet Take 1 tablet (75 mg total) by mouth daily. 90 tablet 3  . lisinopril (PRINIVIL,ZESTRIL) 5 MG tablet TAKE 1 TABLET BY MOUTH DAILY *NEEDS OFFICE VISIT* 30 tablet 6  . LUZU 1 % CREA Apply 1 application topically as needed (athletes foot).     . metFORMIN (GLUCOPHAGE) 500 MG tablet Take 1 tablet (500 mg total) by mouth 2 (two) times daily with a meal. 60 tablet 0  . VIAGRA 100 MG tablet Take 1 tablet (100 mg total) by mouth daily as needed for erectile dysfunction. 10 tablet 3   No current facility-administered medications for this visit.    Allergies:   Review of patient's allergies indicates no known allergies.    Social History:  The patient  reports that he quit smoking about 14 months ago. His smoking use included Cigarettes. He has a 22.5 pack-year smoking history. He has never used smokeless tobacco. He reports that he drinks alcohol. He reports that he uses illicit drugs.   Family History:  The patient's family history includes Alzheimer's disease in his mother and sister; Emphysema in his father.    ROS:  Please see the history of present illness. All other systems are reviewed and negative.    PHYSICAL EXAM: VS:  BP 140/70 mmHg  Pulse 68  Ht 6\' 1"  (1.854 m)  Wt 185 lb (83.915 kg)  BMI 24.41 kg/m2 , BMI Body mass index is 24.41 kg/(m^2). GEN: Well nourished, well developed, male in no acute distress HEENT: normal for age  Neck: no JVD, no carotid bruit, no masses Cardiac: RRR; no murmur, no rubs, or gallops Respiratory:  clear to auscultation bilaterally, normal work of breathing GI: soft, nontender, nondistended, + BS MS: no deformity or  atrophy; no edema; distal pulses are 2+ in all 4 extremities  Skin: warm and dry, no rash Neuro:  Strength and sensation are intact Psych: euthymic mood, full affect   EKG:  EKG is not ordered today.   Recent Labs: 04/11/2015: ALT 19 07/16/2015: BUN 15; Creatinine, Ser 1.15; Hemoglobin 13.0; Platelets 203; Potassium 4.7; Sodium 135    Lipid Panel    Component Value Date/Time   CHOL 146 04/11/2015 1034   TRIG 222* 04/11/2015 1034   HDL 46 04/11/2015 1034   CHOLHDL 3.2 04/11/2015 1034   VLDL 44* 04/11/2015 1034   LDLCALC 56 04/11/2015 1034     Wt Readings from Last 3 Encounters:  07/18/15 185 lb (83.915 kg)  05/15/15 196 lb (88.905 kg)  04/12/15 195 lb (88.451  kg)     Other studies Reviewed: Additional studies/ records that were reviewed today include: Previous office notes and other testing.  ASSESSMENT AND PLAN:  1.  Numbness and tingling in his legs: I reassured Mr. Dimichele that he had good distal pulses last time he was here and I did not feel like it was a circulatory problem. As he is having no edema, both the arteries and veins in his legs are working well. I advised that the symptoms he is having may be secondary to elevated blood sugars.  2. Fatigue: He is encouraged to follow-up with his family physician for this. He admits that he is very physical job and is tired when he comes home at the end of the day. His wife notes that his ability to do chores around the house or other pains after working are greatly decreased. He was encouraged to keep up with baseline fitness level by being active when he is off work. He was also advised that it's possible the diabetes was contributing to his fatigue and hopefully this will get better once his blood sugars are better controlled.  3. Diabetes: He will follow-up with primary care physician for this. He is going to start checking his blood sugars at home as soon as the meter comes in the mail. He is encouraged to be compliant with  the metformin and was reassured that it will not close low blood sugars. Once his A1c is evaluated, his family physician we'll be able to come up with a better treatment plan. He was given information on a diabetic diet.  4. Hypertension: His blood pressure spikes significantly when he is under physical or emotional stress. His blood pressure slightly above target here in the office today. He is compliant with his lisinopril. I discussed increasing the lisinopril versus adding another medication but I prefer to add a low dose of Cardizem CD, 120 mg daily. This may blunt the increases in his blood pressure when he is under some sort of stress and help with control on a day-to-day basis. We discussed symptoms of hypotension and he is to call if he gets anything.  5. PAD: The importance of compliance with medications and keeping his blood pressure blood sugars and cholesterol under control is emphasized. He was advised that we do not currently have a cardiac reason for him to be on disability. He is encouraged to be aware of this body and contact us for symptoms.   Current medicines are reviewed at length with the patient today.  The patient does not have concerns regarding medicines.  The following changes have been made:  Diltiazem CD 120 mg daily  Labs/ tests ordered today include:    Disposition:   FU with Dr. Allyson Sabal  Signed, Theodore Demark, PA-C  07/18/2015 10:29 AM    [facility] Phone: [phone]; Fax: [phone]  This note was written with the assistance of speech recognition software. Please excuse any transcriptional errors.

## 2015-07-18 NOTE — Patient Instructions (Addendum)
Medication Instructions:  1.  START Diltiazem 120 taking 1 tablet daily  Labwork: None ordered  Testing/Procedures: None ordered  Follow-Up: Your physician recommends that you schedule a follow-up appointment in: 3 MONTHS WITH DR. Allyson Sabal   Any Other Special Instructions Will Be Listed Below (If Applicable). Low-Sodium Eating Plan Sodium raises blood pressure and causes water to be held in the body. Getting less sodium from food will help lower your blood pressure, reduce any swelling, and protect your heart, liver, and kidneys. We get sodium by adding salt (sodium chloride) to food. Most of our sodium comes from canned, boxed, and frozen foods. Restaurant foods, fast foods, and pizza are also very high in sodium. Even if you take medicine to lower your blood pressure or to reduce fluid in your body, getting less sodium from your food is important. WHAT IS MY PLAN? Most people should limit their sodium intake to 2,300 mg a day. Your health care provider recommends that you limit your sodium intake to __________ a day.  WHAT DO I NEED TO KNOW ABOUT THIS EATING PLAN? For the low-sodium eating plan, you will follow these general guidelines:  Choose foods with a % Daily Value for sodium of less than 5% (as listed on the food label).   Use salt-free seasonings or herbs instead of table salt or sea salt.   Check with your health care provider or pharmacist before using salt substitutes.   Eat fresh foods.  Eat more vegetables and fruits.  Limit canned vegetables. If you do use them, rinse them well to decrease the sodium.   Limit cheese to 1 oz (28 g) per day.   Eat lower-sodium products, often labeled as "lower sodium" or "no salt added."  Avoid foods that contain monosodium glutamate (MSG). MSG is sometimes added to [place] food and some canned foods.  Check food labels (Nutrition Facts labels) on foods to learn how much sodium is in one serving.  Eat more home-cooked food  and less restaurant, buffet, and fast food.  When eating at a restaurant, ask that your food be prepared with less salt, or no salt if possible.  HOW DO I READ FOOD LABELS FOR SODIUM INFORMATION? The Nutrition Facts label lists the amount of sodium in one serving of the food. If you eat more than one serving, you must multiply the listed amount of sodium by the number of servings. Food labels may also identify foods as:  Sodium free--Less than 5 mg in a serving.  Very low sodium--35 mg or less in a serving.  Low sodium--140 mg or less in a serving.  Light in sodium--50% less sodium in a serving. For example, if a food that usually has 300 mg of sodium is changed to become light in sodium, it will have 150 mg of sodium.  Reduced sodium--25% less sodium in a serving. For example, if a food that usually has 400 mg of sodium is changed to reduced sodium, it will have 300 mg of sodium. WHAT FOODS CAN I EAT? Grains Low-sodium cereals, including oats, puffed wheat and rice, and shredded wheat cereals. Low-sodium crackers. Unsalted rice and pasta. Lower-sodium bread.  Vegetables Frozen or fresh vegetables. Low-sodium or reduced-sodium canned vegetables. Low-sodium or reduced-sodium tomato sauce and paste. Low-sodium or reduced-sodium tomato and vegetable juices.  Fruits Fresh, frozen, and canned fruit. Fruit juice.  Meat and Other Protein Products Low-sodium canned tuna and salmon. Fresh or frozen meat, poultry, seafood, and fish. Lamb. Unsalted nuts. Dried beans, peas, and  lentils without added salt. Unsalted canned beans. Homemade soups without salt. Eggs.  Dairy Milk. Soy milk. Ricotta cheese. Low-sodium or reduced-sodium cheeses. Yogurt.  Condiments Fresh and dried herbs and spices. Salt-free seasonings. Onion and garlic powders. Low-sodium varieties of mustard and ketchup. Fresh or refrigerated horseradish. Lemon juice.  Fats and Oils Reduced-sodium salad dressings. Unsalted  butter.  Other Unsalted popcorn and pretzels.  The items listed above may not be a complete list of recommended foods or beverages. Contact your dietitian for more options. WHAT FOODS ARE NOT RECOMMENDED? Grains Instant hot cereals. Bread stuffing, pancake, and biscuit mixes. Croutons. Seasoned rice or pasta mixes. Noodle soup cups. Boxed or frozen macaroni and cheese. Self-rising flour. Regular salted crackers. Vegetables Regular canned vegetables. Regular canned tomato sauce and paste. Regular tomato and vegetable juices. Frozen vegetables in sauces. Salted [place] fries. Olives. Rosita Fire. Relishes. Sauerkraut. Salsa. Meat and Other Protein Products Salted, canned, smoked, spiced, or pickled meats, seafood, or fish. Bacon, ham, sausage, hot dogs, corned beef, chipped beef, and packaged luncheon meats. Salt pork. Jerky. Pickled herring. Anchovies, regular canned tuna, and sardines. Salted nuts. Dairy Processed cheese and cheese spreads. Cheese curds. Blue cheese and cottage cheese. Buttermilk.  Condiments Onion and garlic salt, seasoned salt, table salt, and sea salt. Canned and packaged gravies. Worcestershire sauce. Tartar sauce. Barbecue sauce. Teriyaki sauce. Soy sauce, including reduced sodium. Steak sauce. Fish sauce. Oyster sauce. Cocktail sauce. Horseradish that you find on the shelf. Regular ketchup and mustard. Meat flavorings and tenderizers. Bouillon cubes. Hot sauce. Tabasco sauce. Marinades. Taco seasonings. Relishes. Fats and Oils Regular salad dressings. Salted butter. Margarine. Ghee. Bacon fat.  Other Potato and tortilla chips. Corn chips and puffs. Salted popcorn and pretzels. Canned or dried soups. Pizza. Frozen entrees and pot pies.  The items listed above may not be a complete list of foods and beverages to avoid. Contact your dietitian for more information.   This information is not intended to replace advice given to you by your health care provider. Make sure  you discuss any questions you have with your health care provider.   Document Released: 09/19/2001 Document Revised: 04/20/2014 Document Reviewed: 02/01/2013 Elsevier Interactive Patient Education 2016 [employer].   Diabetes Mellitus and Food It is important for you to manage your blood sugar (glucose) level. Your blood glucose level can be greatly affected by what you eat. Eating healthier foods in the appropriate amounts throughout the day at about the same time each day will help you control your blood glucose level. It can also help slow or prevent worsening of your diabetes mellitus. Healthy eating may even help you improve the level of your blood pressure and reach or maintain a healthy weight.  General recommendations for healthful eating and cooking habits include:  Eating meals and snacks regularly. Avoid going long periods of time without eating to lose weight.  Eating a diet that consists mainly of plant-based foods, such as fruits, vegetables, nuts, legumes, and whole grains.  Using low-heat cooking methods, such as baking, instead of high-heat cooking methods, such as deep frying. Work with your dietitian to make sure you understand how to use the Nutrition Facts information on food labels. HOW CAN FOOD AFFECT ME? Carbohydrates Carbohydrates affect your blood glucose level more than any other type of food. Your dietitian will help you determine how many carbohydrates to eat at each meal and teach you how to count carbohydrates. Counting carbohydrates is important to keep your blood glucose at a healthy level, especially  if you are using insulin or taking certain medicines for diabetes mellitus. Alcohol Alcohol can cause sudden decreases in blood glucose (hypoglycemia), especially if you use insulin or take certain medicines for diabetes mellitus. Hypoglycemia can be a life-threatening condition. Symptoms of hypoglycemia (sleepiness, dizziness, and disorientation) are similar to  symptoms of having too much alcohol.  If your health care provider has given you approval to drink alcohol, do so in moderation and use the following guidelines:  Women should not have more than one drink per day, and men should not have more than two drinks per day. One drink is equal to:  12 oz of beer.  5 oz of wine.  1 oz of hard liquor.  Do not drink on an empty stomach.  Keep yourself hydrated. Have water, diet soda, or unsweetened iced tea.  Regular soda, juice, and other mixers might contain a lot of carbohydrates and should be counted. WHAT FOODS ARE NOT RECOMMENDED? As you make food choices, it is important to remember that all foods are not the same. Some foods have fewer nutrients per serving than other foods, even though they might have the same number of calories or carbohydrates. It is difficult to get your body what it needs when you eat foods with fewer nutrients. Examples of foods that you should avoid that are high in calories and carbohydrates but low in nutrients include:  Trans fats (most processed foods list trans fats on the Nutrition Facts label).  Regular soda.  Juice.  Candy.  Sweets, such as cake, pie, doughnuts, and cookies.  Fried foods. WHAT FOODS CAN I EAT? Eat nutrient-rich foods, which will nourish your body and keep you healthy. The food you should eat also will depend on several factors, including:  The calories you need.  The medicines you take.  Your weight.  Your blood glucose level.  Your blood pressure level.  Your cholesterol level. You should eat a variety of foods, including:  Protein.  Lean cuts of meat.  Proteins low in saturated fats, such as fish, egg whites, and beans. Avoid processed meats.  Fruits and vegetables.  Fruits and vegetables that may help control blood glucose levels, such as apples, mangoes, and yams.  Dairy products.  Choose fat-free or low-fat dairy products, such as milk, yogurt, and  cheese.  Grains, bread, pasta, and rice.  Choose whole grain products, such as multigrain bread, whole oats, and brown rice. These foods may help control blood pressure.  Fats.  Foods containing healthful fats, such as nuts, avocado, olive oil, canola oil, and fish. DOES EVERYONE WITH DIABETES MELLITUS HAVE THE SAME MEAL PLAN? Because every person with diabetes mellitus is different, there is not one meal plan that works for everyone. It is very important that you meet with a dietitian who will help you create a meal plan that is just right for you.   This information is not intended to replace advice given to you by your health care provider. Make sure you discuss any questions you have with your health care provider.   Document Released: 12/25/2004 Document Revised: 04/20/2014 Document Reviewed: 02/24/2013 Elsevier Interactive Patient Education [employer] Elsevier Inc.

## 2015-07-29 ENCOUNTER — Telehealth: Payer: Self-pay | Admitting: Physician Assistant

## 2015-07-31 ENCOUNTER — Telehealth: Payer: Self-pay

## 2015-07-31 NOTE — Telephone Encounter (Signed)
Message received from medical records that Dr. Nadyne CoombesKaren Richter wants to speak with nurse r/t to pt stress test.  Nurse called MD back and she was unable to speak at this time, left message to call back.

## 2015-07-31 NOTE — Telephone Encounter (Signed)
Reviewed pt stress test with MD and she states she is releasing pt to return to work Part Time as his job is very taxing. MD would like Dr. Allyson SabalBerry to review pt file and state if he thinks pt is cleared to return to work FullTime from a cardiac standpoint. Hal HopeRichter stated please contact her and she will take care of paperwork and contacting pt just would like Dr. Allyson SabalBerry cardiac opinion.  Told would share with Allyson SabalBerry, she verbalized understanding.

## 2015-08-04 NOTE — Telephone Encounter (Signed)
I see no reason that pt can't return back to work full time from a Cardiovasc point of view

## 2015-08-05 NOTE — Telephone Encounter (Signed)
MD recommendations faxed to Dr. Griffith Citronicher's office for review.

## 2016-01-17 ENCOUNTER — Other Ambulatory Visit: Payer: Self-pay | Admitting: Cardiovascular Disease

## 2016-01-17 DIAGNOSIS — I6523 Occlusion and stenosis of bilateral carotid arteries: Secondary | ICD-10-CM

## 2016-01-17 DIAGNOSIS — I739 Peripheral vascular disease, unspecified: Secondary | ICD-10-CM

## 2016-01-17 DIAGNOSIS — Z720 Tobacco use: Secondary | ICD-10-CM

## 2016-01-22 ENCOUNTER — Ambulatory Visit (HOSPITAL_COMMUNITY)
Admission: RE | Admit: 2016-01-22 | Discharge: 2016-01-22 | Disposition: A | Discharge status: Home / Self Care | Payer: BLUE CROSS/BLUE SHIELD | Source: Ambulatory Visit | Attending: Cardiovascular Disease | Admitting: Cardiovascular Disease

## 2016-01-22 DIAGNOSIS — I6523 Occlusion and stenosis of bilateral carotid arteries: Secondary | ICD-10-CM | POA: Diagnosis not present

## 2016-01-22 DIAGNOSIS — I739 Peripheral vascular disease, unspecified: Secondary | ICD-10-CM | POA: Insufficient documentation

## 2016-01-22 DIAGNOSIS — Z720 Tobacco use: Secondary | ICD-10-CM

## 2016-01-25 ENCOUNTER — Other Ambulatory Visit: Payer: Self-pay | Admitting: Cardiovascular Disease

## 2016-01-28 ENCOUNTER — Telehealth: Payer: Self-pay | Admitting: *Deleted

## 2016-01-28 DIAGNOSIS — I6529 Occlusion and stenosis of unspecified carotid artery: Secondary | ICD-10-CM

## 2016-01-28 NOTE — Telephone Encounter (Signed)
-----   Message from Runell GessJonathan J Berry, MD sent at 01/25/2016 10:29 AM EDT ----- LICA stent widely patent. Mild to mod RICA stenosis. Repeat 12 months

## 2016-01-29 ENCOUNTER — Ambulatory Visit: Payer: BLUE CROSS/BLUE SHIELD | Admitting: Cardiovascular Disease

## 2016-02-15 ENCOUNTER — Other Ambulatory Visit: Payer: Self-pay | Admitting: Cardiovascular Disease

## 2016-03-04 ENCOUNTER — Telehealth: Payer: Self-pay | Admitting: Cardiovascular Disease

## 2016-03-04 MED ORDER — VIAGRA 100 MG PO TABS: 100.0000 mg | ORAL_TABLET | Freq: Every day | ORAL | 3 refills | Status: DC | PRN

## 2016-03-04 NOTE — Telephone Encounter (Signed)
New message    Pt verbalized that he is requesting for Dr.Berry to send in a new prescription for his Viagra

## 2016-03-04 NOTE — Telephone Encounter (Signed)
Refill sent.

## 2016-03-11 ENCOUNTER — Encounter: Payer: Self-pay | Admitting: Cardiovascular Disease

## 2016-03-11 ENCOUNTER — Telehealth: Payer: Self-pay | Admitting: *Deleted

## 2016-03-11 ENCOUNTER — Ambulatory Visit (INDEPENDENT_AMBULATORY_CARE_PROVIDER_SITE_OTHER): Payer: BLUE CROSS/BLUE SHIELD | Admitting: Cardiovascular Disease

## 2016-03-11 VITALS — BP 132/64 | HR 59 | Ht 73.0 in | Wt 184.2 lb

## 2016-03-11 DIAGNOSIS — E785 Hyperlipidemia, unspecified: Secondary | ICD-10-CM

## 2016-03-11 DIAGNOSIS — I6522 Occlusion and stenosis of left carotid artery: Secondary | ICD-10-CM

## 2016-03-11 DIAGNOSIS — Z01812 Encounter for preprocedural laboratory examination: Secondary | ICD-10-CM

## 2016-03-11 DIAGNOSIS — I10 Essential (primary) hypertension: Secondary | ICD-10-CM | POA: Diagnosis not present

## 2016-03-11 DIAGNOSIS — I739 Peripheral vascular disease, unspecified: Secondary | ICD-10-CM | POA: Diagnosis not present

## 2016-03-11 NOTE — Assessment & Plan Note (Signed)
History of PAD status post bilateral SFA directional atherectomies and drug-eluting balloon angioplasties twice for restenosis. He's had recent Dopplers that show high-grade lesions in both previously intervened on locations decreased ABIs. He has noticed some mild claudication. Based on this I decided to proceed with bilateral SFA via Bahn covered stenting.

## 2016-03-11 NOTE — Progress Notes (Signed)
   03/11/2016 Anthony Brock   09/19/1955  [id number]  Primary Physician RICHTER,KAREN L., MD Primary Cardiologist: Harley Fitzwater J Jabar Krysiak MD FACP, FACC, FAHA, FSCAI  HPI:  Anthony Brock is a 60-year-old married African-American veteran who works at [employer]. He was referred by Dr. Jah at [facility] for peripheral vascular evaluation because of lifestyle limiting claudication.I last saw him in the office 05/15/15. He had carotid stenting by myself 07/17/14.. He has a long history of tobacco abuse smoking three-quarter pack a day for last 30 years having recently stopped smoking after his last peripheral vascular procedure.. He has never had a heart attack or stroke, and denies chest pain or shortness of breath. He states that he has had left greater than right lower extremity claudication which is lifestyle limiting for several years. Recent Dopplers performed in our office 03/20/14 revealed a right ABI 0.58 with an occluded distal right SFA and a left ABI 0.41 with a high-frequency signal in his left SFA. He underwent angiography and intervention on January 18 revealing a 80% Left internal carotid artery stenosis, and short segment occlusions in both SFAs. I was able to perform directional atherectomy on his left SFA followed by drug eluding balloon angioplasty. He had an excellent angiographic and clinical result. He ultimately underwent staged right SFA intervention with excellent angiographic and clinical result. He underwent elective left internal carotid artery stenting for high-grade (80%) asymptomatic left internal carotid artery stenosis 07/17/14. His recent carotid Dopplers performed 02/07/15 reveals to be widely patent. He does have recurrent claudication with Dopplers performed 02/07/15 revealing high-grade distal bilateral SFA restenosis. He underwent right SFA directional arthrectomy and drug-eluting balloon angioplasty on 02/15/15 with excellent angiographic and clinical result. He underwent  staged left SFA intervention by myself using directional atherectomy and drug-eluting balloon angioplasty on 02/25/15. Dopplers performed 2 weeks later that showed ABIs of 0.9 bilaterally. Recentlower extremity Dopplers performed 01/22/16 revealed recurrent bilateral SFA stenosis with slightly lower ABIs. He does complain of some mild claudication. Carotid Dopplers performed 01/24/16 revealed a widely patent left internal carotid artery stent. Business findings I decided to proceed with PTA and covered stenting using via Bahn covered stents of both SFAs.   Current Outpatient Prescriptions  Medication Sig Dispense Refill  . aspirin EC 81 MG tablet Take 1 tablet (81 mg total) by mouth daily.    . atorvastatin (LIPITOR) 40 MG tablet TAKE 1 TABLET BY MOUTH EVERY EVENING 30 tablet 6  . Blood Glucose Monitoring Suppl (ONE TOUCH ULTRA MINI) w/Device KIT     . cetirizine (ZYRTEC) 10 MG tablet Take 10 mg by mouth daily.    . clopidogrel (PLAVIX) 75 MG tablet Take 1 tablet (75 mg total) by mouth daily. 90 tablet 3  . diltiazem (CARDIZEM CD) 120 MG 24 hr capsule Take 1 capsule (120 mg total) by mouth daily. 90 capsule 3  . fluticasone (FLONASE) 50 MCG/ACT nasal spray     . lisinopril (PRINIVIL,ZESTRIL) 5 MG tablet TAKE ONE (1) TABLET BY MOUTH EVERY DAY 30 tablet 0  . LUZU 1 % CREA Apply 1 application topically as needed (athletes foot).     . metFORMIN (GLUCOPHAGE) 500 MG tablet Take 1 tablet (500 mg total) by mouth 2 (two) times daily with a meal. 60 tablet 0  . ONE TOUCH ULTRA TEST test strip     . ONETOUCH DELICA LANCETS 33G MISC     . VIAGRA 100 MG tablet Take 1 tablet (100 mg total) by mouth daily as   needed for erectile dysfunction. 10 tablet 3   No current facility-administered medications for this visit.     No Known Allergies  Social History   Social History  . Marital status: Married    Spouse name: N/A  . Number of children: N/A  . Years of education: N/A   Occupational History  .  [employer], Materials management    Social History Main Topics  . Smoking status: Former Smoker    Packs/day: 0.50    Years: 45.00    Types: Cigarettes    Quit date: 04/30/2014  . Smokeless tobacco: Never Used     Comment: 02/25/2015 "stopped smoking 02/14/2015"  . Alcohol use 0.0 oz/week     Comment: 02/25/2015 last drink was summer 2016; I've pretty much quit"  . Drug use:      Comment: "might have used some drugs in my 20's"  . Sexual activity: Yes   Other Topics Concern  . Not on file   Social History Narrative  . No narrative on file     Review of Systems: General: negative for chills, fever, night sweats or weight changes.  Cardiovascular: negative for chest pain, dyspnea on exertion, edema, orthopnea, palpitations, paroxysmal nocturnal dyspnea or shortness of breath Dermatological: negative for rash Respiratory: negative for cough or wheezing Urologic: negative for hematuria Abdominal: negative for nausea, vomiting, diarrhea, bright red blood per rectum, melena, or hematemesis Neurologic: negative for visual changes, syncope, or dizziness All other systems reviewed and are otherwise negative except as noted above.    Blood pressure 132/64, pulse (!) 59, height 6' 1" (1.854 m), weight 184 lb 3.2 oz (83.6 kg).  General appearance: alert and no distress Neck: no adenopathy, no carotid bruit, no JVD, supple, symmetrical, trachea midline and thyroid not enlarged, symmetric, no tenderness/mass/nodules Lungs: clear to auscultation bilaterally Heart: regular rate and rhythm, S1, S2 normal, no murmur, click, rub or gallop Extremities: extremities normal, atraumatic, no cyanosis or edema  EKG sinus bradycardia 59 without ST or T-wave changes. I personally reviewed this EKG  ASSESSMENT AND PLAN:   Essential hypertension History of hypertension blood pressure measured at 132/64. He is on lisinopril and diltiazem. Continue current meds at current dosing  Lt ICA stenosis-  Carotid stent placed 07/17/14 History of left carotid stenting by myself 07/17/14 with recent carotid Doppler study that revealed a widely patent carotid stent. Remains on aspirin and Plavix. We will continue to monitor him noninvasively on an annual basis.  PAD (peripheral artery disease) (HCC) History of PAD status post bilateral SFA directional atherectomies and drug-eluting balloon angioplasties twice for restenosis. He's had recent Dopplers that show high-grade lesions in both previously intervened on locations decreased ABIs. He has noticed some mild claudication. Based on this I decided to proceed with bilateral SFA via Bahn covered stenting.  Hyperlipidemia with target LDL less than 70 History of hyperlipidemia on statin therapy followed by his PCP      Megha Agnes J. Taleeyah Bora MD FACP,FACC,FAHA, FSCAI 03/11/2016 11:37 AM 

## 2016-03-11 NOTE — Assessment & Plan Note (Signed)
History of hyperlipidemia on statin therapy followed by his PCP 

## 2016-03-11 NOTE — Assessment & Plan Note (Signed)
History of hypertension blood pressure measured at 132/64. He is on lisinopril and diltiazem. Continue current meds at current dosing

## 2016-03-11 NOTE — Telephone Encounter (Signed)
Durwin NoraWinston called @ 3:52 pm to confirm appointment

## 2016-03-11 NOTE — Patient Instructions (Addendum)
Dr. Allyson SabalBerry has ordered a peripheral angiogram on 03/23/16 to [facility].  This procedure is going to look at the bloodflow in your lower extremities.  If Dr. Allyson SabalBerry is able to open up the arteries, you will have to spend one night in the hospital.  If he is not able to open the arteries, you will be able to go home that same day.    After the procedure, you will not be allowed to drive for 3 days or push, pull, or lift anything greater than 10 lbs for one week.    You will be required to have the following tests prior to the procedure:  1. Blood work-the blood work can be done no more than 14 days prior to the procedure.  It can be done at any [facility] lab.  There is one downstairs on the first floor of this building and one in the [facility] building [phone]([address])  2. Chest Xray-the chest [facility].     *REPS-- Durwin NoraWinston, Viabahn stent (Bilateral SFA)   Puncture site: Rt groin

## 2016-03-11 NOTE — Telephone Encounter (Signed)
Left message at 2:39 pm--ask for return confirmation call

## 2016-03-11 NOTE — Assessment & Plan Note (Signed)
History of left carotid stenting by myself 07/17/14 with recent carotid Doppler study that revealed a widely patent carotid stent. Remains on aspirin and Plavix. We will continue to monitor him noninvasively on an annual basis.

## 2016-03-13 ENCOUNTER — Ambulatory Visit
Admission: RE | Admit: 2016-03-13 | Discharge: 2016-03-13 | Disposition: A | Discharge status: Home / Self Care | Payer: BLUE CROSS/BLUE SHIELD | Source: Ambulatory Visit | Attending: Cardiovascular Disease | Admitting: Cardiovascular Disease

## 2016-03-13 DIAGNOSIS — I6522 Occlusion and stenosis of left carotid artery: Secondary | ICD-10-CM

## 2016-03-13 DIAGNOSIS — E785 Hyperlipidemia, unspecified: Secondary | ICD-10-CM

## 2016-03-13 DIAGNOSIS — I739 Peripheral vascular disease, unspecified: Secondary | ICD-10-CM

## 2016-03-13 LAB — CBC WITH DIFFERENTIAL/PLATELET
BASOS ABS: 0 {cells}/uL (ref 0–200)
Basophils Relative: 0 %
EOS PCT: 3 %
Eosinophils Absolute: 111 cells/uL (ref 15–500)
HCT: 35.9 % — ABNORMAL LOW (ref 38.5–50.0)
Hemoglobin: 12.1 g/dL — ABNORMAL LOW (ref 13.2–17.1)
LYMPHS ABS: 1739 {cells}/uL (ref 850–3900)
LYMPHS PCT: 47 %
MCH: 28.8 pg (ref 27.0–33.0)
MCHC: 33.7 g/dL (ref 32.0–36.0)
MCV: 85.5 fL (ref 80.0–100.0)
MONOS PCT: 10 %
MPV: 9.7 fL (ref 7.5–12.5)
Monocytes Absolute: 370 cells/uL (ref 200–950)
NEUTROS PCT: 40 %
Neutro Abs: 1480 cells/uL — ABNORMAL LOW (ref 1500–7800)
PLATELETS: 217 10*3/uL (ref 140–400)
RBC: 4.2 MIL/uL (ref 4.20–5.80)
RDW: 13.8 % (ref 11.0–15.0)
WBC: 3.7 10*3/uL — AB (ref 3.8–10.8)

## 2016-03-14 LAB — BASIC METABOLIC PANEL WITH GFR
BUN: 15 mg/dL (ref 7–25)
CALCIUM: 9.3 mg/dL (ref 8.6–10.3)
CO2: 24 mmol/L (ref 20–31)
CREATININE: 1.26 mg/dL — AB (ref 0.70–1.25)
Chloride: 102 mmol/L (ref 98–110)
GFR, EST AFRICAN AMERICAN: 71 mL/min (ref >=60)
GFR, EST NON AFRICAN AMERICAN: 61 mL/min (ref >=60)
Glucose, Bld: 102 mg/dL — ABNORMAL HIGH (ref 65–99)
Potassium: 4.2 mmol/L (ref 3.5–5.3)
SODIUM: 136 mmol/L (ref 135–146)

## 2016-03-14 LAB — PROTIME-INR
INR: 1
PROTHROMBIN TIME: 10.7 s (ref 9.0–11.5)

## 2016-03-14 LAB — TSH: TSH: 1.89 m[IU]/L (ref 0.40–4.50)

## 2016-03-14 LAB — APTT: aPTT: 26 s (ref 22–34)

## 2016-03-17 ENCOUNTER — Other Ambulatory Visit: Payer: Self-pay | Admitting: Cardiovascular Disease

## 2016-03-17 ENCOUNTER — Telehealth: Payer: Self-pay | Admitting: Cardiovascular Disease

## 2016-03-17 DIAGNOSIS — I739 Peripheral vascular disease, unspecified: Secondary | ICD-10-CM

## 2016-03-17 NOTE — Telephone Encounter (Signed)
Received Attending Physicians Statement ([employer]) via Fax.  No signed Auth or Check to [employer].  Patient called regarding form and needs as quickly as possible for disability.  Requested I fax [employer] form to [facility] and he will go to their office and sign Auth and pay either this afternoon or tomorrow.  Faxed form on 03/17/16. Left voice mail for [employer]. lp

## 2016-03-19 ENCOUNTER — Telehealth: Payer: Self-pay | Admitting: Cardiovascular Disease

## 2016-03-19 NOTE — Telephone Encounter (Signed)
New message ° ° °Pt verbalized that he is calling to confirm if he should stop taking his Met-Forman medication and he wants to know should he follow through for Saturday  °

## 2016-03-19 NOTE — Telephone Encounter (Signed)
Per Cath letter: You will be instructed when to re-start your medication. LAST DOSE OF METFORMIN WILL BE Saturday 03/21/16.  Spoke with pt states that PCP he changed Metformin to Janumet. informed pt to stop Janumet on Saturday 03-21-16 also. Pt verbalizes understanding and will follow directions

## 2016-03-19 NOTE — Telephone Encounter (Signed)
Received Attending Physicians Statement Forms ( [employer]) back from [employer] @ [facility])  Forms put in Dr Hazle CocaBerry's correspondence. lp

## 2016-03-20 ENCOUNTER — Telehealth: Payer: Self-pay | Admitting: Cardiovascular Disease

## 2016-03-20 NOTE — Telephone Encounter (Signed)
Pt states [facility] is [facility] a [facility] regarding Disability paperwork

## 2016-03-23 ENCOUNTER — Encounter (HOSPITAL_COMMUNITY)
Admission: RE | Disposition: A | Discharge status: Home / Self Care | Payer: Self-pay | Source: Ambulatory Visit | Attending: Cardiovascular Disease

## 2016-03-23 ENCOUNTER — Ambulatory Visit (HOSPITAL_COMMUNITY)
Admission: RE | Admit: 2016-03-23 | Discharge: 2016-03-23 | Disposition: A | Discharge status: Home / Self Care | Payer: BLUE CROSS/BLUE SHIELD | Source: Ambulatory Visit | Attending: Cardiovascular Disease | Admitting: Cardiovascular Disease

## 2016-03-23 ENCOUNTER — Encounter (HOSPITAL_COMMUNITY): Payer: Self-pay | Admitting: *Deleted

## 2016-03-23 DIAGNOSIS — I739 Peripheral vascular disease, unspecified: Secondary | ICD-10-CM | POA: Diagnosis present

## 2016-03-23 DIAGNOSIS — E785 Hyperlipidemia, unspecified: Secondary | ICD-10-CM | POA: Insufficient documentation

## 2016-03-23 DIAGNOSIS — Z7984 Long term (current) use of oral hypoglycemic drugs: Secondary | ICD-10-CM | POA: Insufficient documentation

## 2016-03-23 DIAGNOSIS — I10 Essential (primary) hypertension: Secondary | ICD-10-CM | POA: Diagnosis not present

## 2016-03-23 DIAGNOSIS — Z87891 Personal history of nicotine dependence: Secondary | ICD-10-CM | POA: Insufficient documentation

## 2016-03-23 DIAGNOSIS — Z7902 Long term (current) use of antithrombotics/antiplatelets: Secondary | ICD-10-CM | POA: Diagnosis not present

## 2016-03-23 DIAGNOSIS — Z7982 Long term (current) use of aspirin: Secondary | ICD-10-CM | POA: Diagnosis not present

## 2016-03-23 DIAGNOSIS — I70212 Atherosclerosis of native arteries of extremities with intermittent claudication, left leg: Secondary | ICD-10-CM | POA: Diagnosis not present

## 2016-03-23 DIAGNOSIS — I70213 Atherosclerosis of native arteries of extremities with intermittent claudication, bilateral legs: Secondary | ICD-10-CM | POA: Diagnosis not present

## 2016-03-23 DIAGNOSIS — I6522 Occlusion and stenosis of left carotid artery: Secondary | ICD-10-CM | POA: Diagnosis not present

## 2016-03-23 HISTORY — PX: PERIPHERAL VASCULAR CATHETERIZATION: SHX172C

## 2016-03-23 LAB — POCT ACTIVATED CLOTTING TIME
Activated Clotting Time: 213 seconds
Activated Clotting Time: 224 seconds
Activated Clotting Time: 202 seconds
Activated Clotting Time: 175 seconds

## 2016-03-23 LAB — GLUCOSE, CAPILLARY
GLUCOSE-CAPILLARY: 103 mg/dL — AB (ref 65–99)
Glucose-Capillary: 95 mg/dL (ref 65–99)

## 2016-03-23 SURGERY — LOWER EXTREMITY INTERVENTION
Anesthesia: LOCAL | Laterality: Left

## 2016-03-23 MED ORDER — ACETAMINOPHEN 325 MG PO TABS: 650.0000 mg | ORAL_TABLET | ORAL | Status: DC | PRN

## 2016-03-23 MED ORDER — HEPARIN (PORCINE) IN NACL 2-0.9 UNIT/ML-% IJ SOLN
INTRAMUSCULAR | Status: DC | PRN
  Administered 2016-03-23: 1000 mL

## 2016-03-23 MED ORDER — SODIUM CHLORIDE 0.9 % WEIGHT BASED INFUSION: 1.0000 mL/kg/h | INTRAVENOUS | Status: DC

## 2016-03-23 MED ORDER — MORPHINE SULFATE (PF) 4 MG/ML IV SOLN: 2.0000 mg | INTRAVENOUS | Status: DC | PRN

## 2016-03-23 MED ORDER — ASPIRIN EC 81 MG PO TBEC: 81.0000 mg | DELAYED_RELEASE_TABLET | Freq: Every day | ORAL | Status: DC

## 2016-03-23 MED ORDER — SODIUM CHLORIDE 0.9% FLUSH: 3.0000 mL | INTRAVENOUS | Status: DC | PRN

## 2016-03-23 MED ORDER — CLOPIDOGREL BISULFATE 75 MG PO TABS: 75.0000 mg | ORAL_TABLET | Freq: Every day | ORAL | Status: DC

## 2016-03-23 MED ORDER — HEPARIN (PORCINE) IN NACL 2-0.9 UNIT/ML-% IJ SOLN
INTRAMUSCULAR | Status: AC
  Filled 2016-03-23: qty 1000

## 2016-03-23 MED ORDER — MIDAZOLAM HCL 2 MG/2ML IJ SOLN
INTRAMUSCULAR | Status: DC | PRN
  Administered 2016-03-23: 1 mg via INTRAVENOUS

## 2016-03-23 MED ORDER — LIDOCAINE HCL (PF) 1 % IJ SOLN
INTRAMUSCULAR | Status: DC | PRN
  Administered 2016-03-23: 18 mL via INTRADERMAL

## 2016-03-23 MED ORDER — ONDANSETRON HCL 4 MG/2ML IJ SOLN: 4.0000 mg | Freq: Four times a day (QID) | INTRAMUSCULAR | Status: DC | PRN

## 2016-03-23 MED ORDER — SODIUM CHLORIDE 0.9 % WEIGHT BASED INFUSION
3.0000 mL/kg/h | INTRAVENOUS | Status: AC
  Administered 2016-03-23: 3 mL/kg/h via INTRAVENOUS

## 2016-03-23 MED ORDER — IODIXANOL 320 MG/ML IV SOLN
INTRAVENOUS | Status: DC | PRN
  Administered 2016-03-23: 225 mL via INTRA_ARTERIAL

## 2016-03-23 MED ORDER — SODIUM CHLORIDE 0.9 % IV SOLN: INTRAVENOUS | Status: AC

## 2016-03-23 MED ORDER — ASPIRIN 81 MG PO CHEW: 81.0000 mg | CHEWABLE_TABLET | ORAL | Status: DC

## 2016-03-23 MED ORDER — HYDRALAZINE HCL 20 MG/ML IJ SOLN: 10.0000 mg | INTRAMUSCULAR | Status: DC | PRN

## 2016-03-23 MED ORDER — MIDAZOLAM HCL 2 MG/2ML IJ SOLN
INTRAMUSCULAR | Status: AC
  Filled 2016-03-23: qty 2

## 2016-03-23 MED ORDER — FENTANYL CITRATE (PF) 100 MCG/2ML IJ SOLN
INTRAMUSCULAR | Status: AC
  Filled 2016-03-23: qty 2

## 2016-03-23 MED ORDER — HEPARIN SODIUM (PORCINE) 1000 UNIT/ML IJ SOLN
INTRAMUSCULAR | Status: DC | PRN
  Administered 2016-03-23: 6000 [IU] via INTRAVENOUS
  Administered 2016-03-23: 2500 [IU] via INTRAVENOUS
  Administered 2016-03-23: 1500 [IU] via INTRAVENOUS

## 2016-03-23 MED ORDER — LIDOCAINE HCL (PF) 1 % IJ SOLN
INTRAMUSCULAR | Status: AC
  Filled 2016-03-23: qty 30

## 2016-03-23 MED ORDER — FENTANYL CITRATE (PF) 100 MCG/2ML IJ SOLN
INTRAMUSCULAR | Status: DC | PRN
  Administered 2016-03-23: 25 ug via INTRAVENOUS

## 2016-03-23 SURGICAL SUPPLY — 17 items
BALLN ARMADA 5X80X135 (BALLOONS) ×3
BALLOON ARMADA 5X80X135 (BALLOONS) ×2 IMPLANT
CATH ANGIO 5F BER2 65CM (CATHETERS) ×3 IMPLANT
CATH ANGIO 5F PIGTAIL 65CM (CATHETERS) ×3 IMPLANT
CATH TEMPO 5F RIM 65CM (CATHETERS) ×3 IMPLANT
KIT PV (KITS) ×3 IMPLANT
SHEATH PINNACLE 5F 10CM (SHEATH) ×3 IMPLANT
SHEATH PINNACLE 6F 10CM (SHEATH) ×3 IMPLANT
SHEATH PINNACLE ST 6F 45CM (SHEATH) ×3 IMPLANT
STENT ZILVER PTX 6X100 (Permanent Stent) ×3 IMPLANT
SYRINGE MEDRAD AVANTA MACH 7 (SYRINGE) ×3 IMPLANT
TAPE RADIOPAQUE TURBO (MISCELLANEOUS) ×3 IMPLANT
TRANSDUCER W/STOPCOCK (MISCELLANEOUS) ×3 IMPLANT
TRAY PV CATH (CUSTOM PROCEDURE TRAY) ×3 IMPLANT
WIRE HITORQ VERSACORE ST 145CM (WIRE) ×3 IMPLANT
WIRE SPARTACORE .014X300CM (WIRE) ×3 IMPLANT
WIRE VERSACORE LOC 115CM (WIRE) ×3 IMPLANT

## 2016-03-23 NOTE — Progress Notes (Signed)
Site area: rt groin Site Prior to Removal:  Level 0 Pressure Applied For: 25 minutes Manual:   yes Patient Status During Pull:  stable Post Pull Site:  Level  0 Post Pull Instructions Given:  yes Post Pull Pulses Present: yes Dressing Applied:  yes Bedrest begins @  1120 Comments:

## 2016-03-23 NOTE — Discharge Instructions (Signed)
NO JANUMET FOR 2 DAYS   Angiogram, Care After These instructions give you information about caring for yourself after your procedure. Your doctor may also give you more specific instructions. Call your doctor if you have any problems or questions after your procedure. Follow these instructions at home:  Take medicines only as told by your doctor.  Follow your doctor's instructions about:  Care of the area where the tube was inserted.  Bandage (dressing) changes and removal.  You may shower 24-48 hours after the procedure or as told by your doctor.  Do not take baths, swim, or use a hot tub until your doctor approves.  Every day, check the area where the tube was inserted. Watch for:  Redness, swelling, or pain.  Fluid, blood, or pus.  Do not apply powder or lotion to the site.  Do not lift anything that is heavier than 10 lb (4.5 kg) for 5 days or as told by your doctor.  Ask your doctor when you can:  Return to work or school.  Do physical activities or play sports.  Have sex.  Do not drive or operate heavy machinery for 24 hours or as told by your doctor.  Have someone with you for the first 24 hours after the procedure.  Keep all follow-up visits as told by your doctor. This is important. Contact a health care provider if:  You have a fever.  You have chills.  You have more bleeding from the area where the tube was inserted. Hold pressure on the area.  You have redness, swelling, or pain in the area where the tube was inserted.  You have fluid or pus coming from the area. Get help right away if:  You have a lot of pain in the area where the tube was inserted.  The area where the tube was inserted is bleeding, and the bleeding does not stop after 30 minutes of holding steady pressure on the area.  The area near or just beyond the insertion site becomes pale, cool, tingly, or numb. This information is not intended to replace advice given to you by your  health care provider. Make sure you discuss any questions you have with your health care provider. Document Released: 06/26/2008 Document Revised: 09/05/2015 Document Reviewed: 08/31/2012 Elsevier Interactive Patient Education  2017 [employer]

## 2016-03-23 NOTE — H&P (View-Only) (Signed)
03/11/2016 Anthony Brock   06/09/1955  [id number]  Primary Physician [name], MD Primary Cardiologist: [name] [name]  HPI:  Mr. Vora is a 60 year old married African-American veteran who works at [employer]. He was referred by Dr. Melony Overly at [facility] for peripheral vascular evaluation because of lifestyle limiting claudication.I last saw him in the office 05/15/15. He had carotid stenting by myself 07/17/14.[name] He has a long history of tobacco abuse smoking three-quarter pack a day for last 30 years having recently stopped smoking after his last peripheral vascular procedure.. He has never had a heart attack or stroke, and denies chest pain or shortness of breath. He states that he has had left greater than right lower extremity claudication which is lifestyle limiting for several years. Recent Dopplers performed in our office 03/20/14 revealed a right ABI 0.58 with an occluded distal right SFA and a left ABI 0.41 with a high-frequency signal in his left SFA. He underwent angiography and intervention on January 18 revealing a 80% Left internal carotid artery stenosis, and short segment occlusions in both SFAs. I was able to perform directional atherectomy on his left SFA followed by drug eluding balloon angioplasty. He had an excellent angiographic and clinical result. He ultimately underwent staged right SFA intervention with excellent angiographic and clinical result. He underwent elective left internal carotid artery stenting for high-grade (80%) asymptomatic left internal carotid artery stenosis 07/17/14. His recent carotid Dopplers performed 02/07/15 reveals to be widely patent. He does have recurrent claudication with Dopplers performed 02/07/15 revealing high-grade distal bilateral SFA restenosis. He underwent right SFA directional arthrectomy and drug-eluting balloon angioplasty on 02/15/15 with excellent angiographic and clinical result. He underwent  staged left SFA intervention by myself using directional atherectomy and drug-eluting balloon angioplasty on 02/25/15. Dopplers performed 2 weeks later that showed ABIs of 0.9 bilaterally. Recentlower extremity Dopplers performed 01/22/16 revealed recurrent bilateral SFA stenosis with slightly lower ABIs. He does complain of some mild claudication. Carotid Dopplers performed 01/24/16 revealed a widely patent left internal carotid artery stent. Business findings I decided to proceed with PTA and covered stenting using via Bahn covered stents of both SFAs.   Current Outpatient Prescriptions  Medication Sig Dispense Refill  . aspirin EC 81 MG tablet Take 1 tablet (81 mg total) by mouth daily.    [name] atorvastatin (LIPITOR) 40 MG tablet TAKE 1 TABLET BY MOUTH EVERY EVENING 30 tablet 6  . Blood Glucose Monitoring Suppl (ONE TOUCH ULTRA MINI) w/Device KIT     . cetirizine (ZYRTEC) 10 MG tablet Take 10 mg by mouth daily.    . clopidogrel (PLAVIX) 75 MG tablet Take 1 tablet (75 mg total) by mouth daily. 90 tablet 3  . diltiazem (CARDIZEM CD) 120 MG 24 hr capsule Take 1 capsule (120 mg total) by mouth daily. 90 capsule 3  . fluticasone (FLONASE) 50 MCG/ACT nasal spray     . lisinopril (PRINIVIL,ZESTRIL) 5 MG tablet TAKE ONE (1) TABLET BY MOUTH EVERY DAY 30 tablet 0  . LUZU 1 % CREA Apply 1 application topically as needed (athletes foot).     . metFORMIN (GLUCOPHAGE) 500 MG tablet Take 1 tablet (500 mg total) by mouth 2 (two) times daily with a meal. 60 tablet 0  . ONE TOUCH ULTRA TEST test strip     . ONETOUCH DELICA LANCETS 03B MISC     . VIAGRA 100 MG tablet Take 1 tablet (100 mg total) by mouth daily as  needed for erectile dysfunction. 10 tablet 3   No current facility-administered medications for this visit.     No Known Allergies  Social History   Social History  . Marital status: Married    Spouse name: N/A  . Number of children: N/A  . Years of education: N/A   Occupational History  .  [employer], Materials management    Social History Main Topics  . Smoking status: Former Smoker    Packs/day: 0.50    Years: 45.00    Types: Cigarettes    Quit date: 04/30/2014  . Smokeless tobacco: Never Used     Comment: 02/25/2015 "stopped smoking 02/14/2015"  . Alcohol use 0.0 oz/week     Comment: 02/25/2015 last drink was summer 2016; I've pretty much quit"  . Drug use:      Comment: "might have used some drugs in my 20's"  . Sexual activity: Yes   Other Topics Concern  . Not on file   Social History Narrative  . No narrative on file     Review of Systems: General: negative for chills, fever, night sweats or weight changes.  Cardiovascular: negative for chest pain, dyspnea on exertion, edema, orthopnea, palpitations, paroxysmal nocturnal dyspnea or shortness of breath Dermatological: negative for rash Respiratory: negative for cough or wheezing Urologic: negative for hematuria Abdominal: negative for nausea, vomiting, diarrhea, bright red blood per rectum, melena, or hematemesis Neurologic: negative for visual changes, syncope, or dizziness All other systems reviewed and are otherwise negative except as noted above.    Blood pressure 132/64, pulse (!) 59, height _0  (1.854 m), weight 184 lb 3.2 oz (83.6 kg).  General appearance: alert and no distress Neck: no adenopathy, no carotid bruit, no JVD, supple, symmetrical, trachea midline and thyroid not enlarged, symmetric, no tenderness/mass/nodules Lungs: clear to auscultation bilaterally Heart: regular rate and rhythm, S1, S2 normal, no murmur, click, rub or gallop Extremities: extremities normal, atraumatic, no cyanosis or edema  EKG sinus bradycardia 59 without ST or T-wave changes. I personally reviewed this EKG  ASSESSMENT AND PLAN:   Essential hypertension History of hypertension blood pressure measured at 132/64. He is on lisinopril and diltiazem. Continue current meds at current dosing  Lt ICA stenosis-  Carotid stent placed 07/17/14 History of left carotid stenting by myself 07/17/14 with recent carotid Doppler study that revealed a widely patent carotid stent. Remains on aspirin and Plavix. We will continue to monitor him noninvasively on an annual basis.  PAD (peripheral artery disease) (HCC) History of PAD status post bilateral SFA directional atherectomies and drug-eluting balloon angioplasties twice for restenosis. He's had recent Dopplers that show high-grade lesions in both previously intervened on locations decreased ABIs. He has noticed some mild claudication. Based on this I decided to proceed with bilateral SFA via Bahn covered stenting.  Hyperlipidemia with target LDL less than 70 History of hyperlipidemia on statin therapy followed by his PCP      [name] [facility], [facility] 03/11/2016 11:37 AM

## 2016-03-23 NOTE — Interval H&P Note (Signed)
History and Physical Interval Note:  03/23/2016 7:45 AM  Anthony Brock  has presented today for surgery, with the diagnosis of claudication  The various methods of treatment have been discussed with the patient and family. After consideration of risks, benefits and other options for treatment, the patient has consented to  Procedure(s): Lower Extremity Intervention (Bilateral) as a surgical intervention .  The patient's history has been reviewed, patient examined, no change in status, stable for surgery.  I have reviewed the patient's chart and labs.  Questions were answered to the patient's satisfaction.     [name], Deakon Frix

## 2016-03-24 ENCOUNTER — Telehealth: Payer: Self-pay | Admitting: Cardiovascular Disease

## 2016-03-24 ENCOUNTER — Encounter (HOSPITAL_COMMUNITY): Payer: Self-pay | Admitting: Cardiovascular Disease

## 2016-03-24 NOTE — Telephone Encounter (Signed)
Spoke to pt. Filled out questions on paperwork and will give back to Medical Records.

## 2016-03-24 NOTE — Telephone Encounter (Signed)
Received Signed Attending Physicians Statement back from Dr Allyson SabalBerry.  Notified patient that [facility]  Prepared copy for him to pick up.  lp

## 2016-03-26 NOTE — Telephone Encounter (Signed)
Anthony Brock is wanting to speak with you in reference to his f/u visit after his procedure on 03/23/17.  Please Call

## 2016-03-27 NOTE — Telephone Encounter (Signed)
Spoke to pt. He thought his appt was Thursday, but had appt scheduled for Monday, 03/30/16 @ 9:45 am. I informed him of this. He also had questions about when he can start lifting more than 10 lbs and going up stairs and start taking his Janumet again. I advised the pt to wait until he sees Dr. Allyson SabalBerry on Monday. Pt was satisfied and grateful for the call back.

## 2016-03-30 ENCOUNTER — Ambulatory Visit (INDEPENDENT_AMBULATORY_CARE_PROVIDER_SITE_OTHER): Payer: BLUE CROSS/BLUE SHIELD | Admitting: Cardiovascular Disease

## 2016-03-30 ENCOUNTER — Other Ambulatory Visit: Payer: Self-pay | Admitting: Cardiovascular Disease

## 2016-03-30 ENCOUNTER — Encounter: Payer: Self-pay | Admitting: Cardiovascular Disease

## 2016-03-30 VITALS — BP 140/66 | HR 60 | Ht 73.0 in | Wt 184.0 lb

## 2016-03-30 DIAGNOSIS — Z01812 Encounter for preprocedural laboratory examination: Secondary | ICD-10-CM | POA: Diagnosis not present

## 2016-03-30 DIAGNOSIS — I739 Peripheral vascular disease, unspecified: Secondary | ICD-10-CM

## 2016-03-30 NOTE — Assessment & Plan Note (Signed)
Mr. Anthony Brock returns today for post hospital follow-up. He's had 2 procedures on each SFA involving directional atherectomy and drug-eluting balloon angioplasty with early restenosis. I re-angiogramed him 03/23/16 revealing 90% "restenosis" within both SFAs. I performed PTA and stenting using a 6 mm x 80 mm long Zilver drug-eluting stent on his proximal left SFA. Left leg is symptomatically improved. He has residual disease on the right and wishes to proceed with similar revascularization.

## 2016-03-30 NOTE — Progress Notes (Signed)
03/30/2016 Anthony Brock   1955-05-21  [id number]  Primary Physician Anthony Brock., MD Primary Cardiologist: Anthony Harp MD Anthony Brock  HPI:  [name] is a 60 year old married African-American veteran who works at [employer]. He was referred by Dr. Melony Brock at [facility] for peripheral vascular evaluation because of lifestyle limiting claudication.I last saw him in the office 03/11/16. He had carotid stenting by myself 07/17/14.[name] He has a long history of tobacco abuse smoking three-quarter pack a day for last 30 years having recently stopped smoking after his last peripheral vascular procedure.. He has never had a heart attack or stroke, and denies chest pain or shortness of breath. He states that he has had left greater than right lower extremity claudication which is lifestyle limiting for several years. Recent Dopplers performed in our office 03/20/14 revealed a right ABI 0.58 with an occluded distal right SFA and a left ABI 0.41 with a high-frequency signal in his left SFA. He underwent angiography and intervention on January 18 revealing a 80% Left internal carotid artery stenosis, and short segment occlusions in both SFAs. I was able to perform directional atherectomy on his left SFA followed by drug eluding balloon angioplasty. He had an excellent angiographic and clinical result. He ultimately underwent staged right SFA intervention with excellent angiographic and clinical result. He underwent elective left internal carotid artery stenting for high-grade (80%) asymptomatic left internal carotid artery stenosis 07/17/14. His recent carotid Dopplers performed 02/07/15 reveals to be widely patent. He does have recurrent claudication with Dopplers performed 02/07/15 revealing high-grade distal bilateral SFA restenosis. He underwent right SFA directional arthrectomy and drug-eluting balloon angioplasty on 02/15/15 with excellent angiographic and clinical result. He underwent  staged left SFA intervention by myself using directional atherectomy and drug-eluting balloon angioplasty on 02/25/15. Dopplers performed 2 weeks later that showed ABIs of 0.9 bilaterally. Recentlower extremity Dopplers performed 01/22/16 revealed recurrent bilateral SFA stenosis with slightly lower ABIs. He does complain of some mild claudication. Carotid Dopplers performed 01/24/16 revealed a widely patent left internal carotid artery stent. Because of these findings, I decided to change revascularization strategies and use a Zilver drug-eluting stent. I angiogramed him 03/23/16 revealing 90% segmental mid right and proximal left SFA stenosis and placed a 6 mm x 80 mm long Zilver drug-eluting stent in his proximal left SFA with an excellent angiographic and clinical result. His left leg feels somewhat improved compared to the right and he wishes to have his right revascularized similarly.    Current Outpatient Prescriptions  Medication Sig Dispense Refill  . aspirin EC 81 MG tablet Take 1 tablet (81 mg total) by mouth daily.    [name] atorvastatin (LIPITOR) 40 MG tablet TAKE 1 TABLET BY MOUTH EVERY EVENING 30 tablet 6  . Blood Glucose Monitoring Suppl (ONE TOUCH ULTRA MINI) w/Device KIT     . cetirizine (ZYRTEC) 10 MG tablet Take 10 mg by mouth daily as needed for allergies.     [name] clopidogrel (PLAVIX) 75 MG tablet Take 1 tablet (75 mg total) by mouth daily. 90 tablet 3  . diltiazem (CARDIZEM CD) 120 MG 24 hr capsule Take 1 capsule (120 mg total) by mouth daily. (Patient taking differently: Take 120 mg by mouth every evening. ) 90 capsule 3  . gabapentin (NEURONTIN) 300 MG capsule Take 300 mg by mouth every evening.    [name] JANUMET 50-500 MG tablet Take 1 tablet by mouth 2 (two) times daily.    [name] lisinopril (PRINIVIL,ZESTRIL)  5 MG tablet TAKE ONE (1) TABLET BY MOUTH EVERY DAY (Patient taking differently: Take 28ms twice daily) 30 tablet 0  . LUZU 1 % CREA Apply 1 application topically as needed (athletes  foot).     . ONE TOUCH ULTRA TEST test strip     . ONETOUCH DELICA LANCETS 309TMISC     . Pseudoephedrine-APAP-DM (DAYQUIL PO) Take 2 capsules by mouth 2 (two) times daily as needed (cold symptoms).    .Marland KitchenVIAGRA 100 MG tablet Take 1 tablet (100 mg total) by mouth daily as needed for erectile dysfunction. 10 tablet 3   No current facility-administered medications for this visit.     No Known Allergies  Social History   Social History  . Marital status: Married    Spouse name: N/A  . Number of children: N/A  . Years of education: N/A   Occupational History  . [employer]    Social History Main Topics  . Smoking status: Former Smoker    Packs/day: 0.50    Years: 45.00    Types: Cigarettes    Quit date: 04/30/2014  . Smokeless tobacco: Never Used     Comment: 02/25/2015 "stopped smoking 02/14/2015"  . Alcohol use 0.0 oz/week     Comment: 02/25/2015 last drink was summer 2016; I've pretty much quit"  . Drug use:      Comment: "might have used some drugs in my 20's"  . Sexual activity: Yes   Other Topics Concern  . Not on file   Social History Narrative  . No narrative on file     Review of Systems: General: negative for chills, fever, night sweats or weight changes.  Cardiovascular: negative for chest pain, dyspnea on exertion, edema, orthopnea, palpitations, paroxysmal nocturnal dyspnea or shortness of breath Dermatological: negative for rash Respiratory: negative for cough or wheezing Urologic: negative for hematuria Abdominal: negative for nausea, vomiting, diarrhea, bright red blood per rectum, melena, or hematemesis Neurologic: negative for visual changes, syncope, or dizziness All other systems reviewed and are otherwise negative except as noted above.    Blood pressure 140/66, pulse 60, height 6' 1" (1.854 m), weight 184 lb (83.5 kg).  General appearance: alert and no distress Neck: no adenopathy, no carotid bruit, no JVD, supple,  symmetrical, trachea midline and thyroid not enlarged, symmetric, no tenderness/mass/nodules Lungs: clear to auscultation bilaterally Heart: regular rate and rhythm, S1, S2 normal, no murmur, click, rub or gallop Extremities: extremities normal, atraumatic, no cyanosis or edema  EKG not performed today  ASSESSMENT AND PLAN:   PAD (peripheral artery disease) ([facility] Mr. JSchirmerreturns today for post hospital follow-up. He's had 2 procedures on each SFA involving directional atherectomy and drug-eluting balloon angioplasty with early restenosis. I re-angiogramed him 03/23/16 revealing 90% "restenosis" within both SFAs. I performed PTA and stenting using a 6 mm x 80 mm long Zilver drug-eluting stent on his proximal left SFA. Left leg is symptomatically improved. He has residual disease on the right and wishes to proceed with similar revascularization.      JLorretta HarpMD FACP,FACC,FAHA, FPershing Memorial Hospital12/18/2017 10:24 AM

## 2016-03-30 NOTE — Patient Instructions (Signed)
Dr. Allyson SabalBerry has ordered a peripheral angiogram, Right SFA on April 20, 2016 [facility].  This procedure is going to look at the bloodflow in your lower extremities.  If Dr. Allyson SabalBerry is able to open up the arteries, you will have to spend one night in the hospital.  If he is not able to open the arteries, you will be able to go home that same day.    After the procedure, you will not be allowed to drive for 3 days or push, pull, or lift anything greater than 10 lbs for one week.    You will be required to have the following tests prior to the procedure:  1. Blood work-the blood work can be done no more than 14 days prior to the procedure.  It can be done at any [facility] lab.  There is one downstairs on the first floor of this building and one in the [facility] building [phone]([address])   *REPS NONE  Puncture site Left Groin

## 2016-04-02 ENCOUNTER — Other Ambulatory Visit: Payer: Self-pay | Admitting: Cardiovascular Disease

## 2016-04-10 LAB — CBC WITH DIFFERENTIAL/PLATELET
BASOS ABS: 0 {cells}/uL (ref 0–200)
Basophils Relative: 0 %
EOS ABS: 0 {cells}/uL — AB (ref 15–500)
Eosinophils Relative: 0 %
HEMATOCRIT: 36.3 % — AB (ref 38.5–50.0)
HEMOGLOBIN: 12.3 g/dL — AB (ref 13.2–17.1)
LYMPHS PCT: 37 %
Lymphs Abs: 2294 cells/uL (ref 850–3900)
MCH: 28.7 pg (ref 27.0–33.0)
MCHC: 33.9 g/dL (ref 32.0–36.0)
MCV: 84.6 fL (ref 80.0–100.0)
MONO ABS: 496 {cells}/uL (ref 200–950)
MPV: 9.5 fL (ref 7.5–12.5)
Monocytes Relative: 8 %
NEUTROS PCT: 55 %
Neutro Abs: 3410 cells/uL (ref 1500–7800)
Platelets: 285 10*3/uL (ref 140–400)
RBC: 4.29 MIL/uL (ref 4.20–5.80)
RDW: 14.1 % (ref 11.0–15.0)
WBC: 6.2 10*3/uL (ref 3.8–10.8)

## 2016-04-11 LAB — PROTIME-INR
INR: 1
Prothrombin Time: 10.6 s (ref 9.0–11.5)

## 2016-04-11 LAB — BASIC METABOLIC PANEL WITH GFR
BUN: 12 mg/dL (ref 7–25)
CHLORIDE: 103 mmol/L (ref 98–110)
CO2: 26 mmol/L (ref 20–31)
CREATININE: 1.18 mg/dL (ref 0.70–1.25)
Calcium: 9.2 mg/dL (ref 8.6–10.3)
GFR, Est African American: 77 mL/min (ref >=60)
GFR, Est Non African American: 66 mL/min (ref >=60)
GLUCOSE: 111 mg/dL — AB (ref 65–99)
Potassium: 4.3 mmol/L (ref 3.5–5.3)
Sodium: 138 mmol/L (ref 135–146)

## 2016-04-11 LAB — APTT: aPTT: 23 s (ref 22–34)

## 2016-04-20 ENCOUNTER — Encounter (HOSPITAL_COMMUNITY): Payer: Self-pay | Admitting: *Deleted

## 2016-04-20 ENCOUNTER — Observation Stay (HOSPITAL_COMMUNITY)
Admission: RE | Admit: 2016-04-20 | Discharge: 2016-04-21 | Disposition: A | Discharge status: Home / Self Care | Payer: BLUE CROSS/BLUE SHIELD | Source: Ambulatory Visit | Attending: Cardiovascular Disease | Admitting: Cardiovascular Disease

## 2016-04-20 ENCOUNTER — Encounter (HOSPITAL_COMMUNITY)
Admission: RE | Disposition: A | Discharge status: Home / Self Care | Payer: Self-pay | Source: Ambulatory Visit | Attending: Cardiovascular Disease

## 2016-04-20 DIAGNOSIS — Z79899 Other long term (current) drug therapy: Secondary | ICD-10-CM | POA: Diagnosis not present

## 2016-04-20 DIAGNOSIS — I70211 Atherosclerosis of native arteries of extremities with intermittent claudication, right leg: Principal | ICD-10-CM | POA: Insufficient documentation

## 2016-04-20 DIAGNOSIS — E785 Hyperlipidemia, unspecified: Secondary | ICD-10-CM | POA: Diagnosis not present

## 2016-04-20 DIAGNOSIS — I10 Essential (primary) hypertension: Secondary | ICD-10-CM | POA: Insufficient documentation

## 2016-04-20 DIAGNOSIS — Z7982 Long term (current) use of aspirin: Secondary | ICD-10-CM | POA: Diagnosis not present

## 2016-04-20 DIAGNOSIS — Z87891 Personal history of nicotine dependence: Secondary | ICD-10-CM | POA: Insufficient documentation

## 2016-04-20 DIAGNOSIS — Z7984 Long term (current) use of oral hypoglycemic drugs: Secondary | ICD-10-CM | POA: Insufficient documentation

## 2016-04-20 DIAGNOSIS — I739 Peripheral vascular disease, unspecified: Secondary | ICD-10-CM | POA: Diagnosis present

## 2016-04-20 HISTORY — PX: PERIPHERAL VASCULAR CATHETERIZATION: SHX172C

## 2016-04-20 LAB — POCT ACTIVATED CLOTTING TIME
Activated Clotting Time: 213 seconds
Activated Clotting Time: 230 seconds
Activated Clotting Time: 186 seconds
Activated Clotting Time: 175 seconds

## 2016-04-20 LAB — GLUCOSE, CAPILLARY
GLUCOSE-CAPILLARY: 105 mg/dL — AB (ref 65–99)
Glucose-Capillary: 87 mg/dL (ref 65–99)

## 2016-04-20 SURGERY — PERIPHERAL VASCULAR INTERVENTION
Laterality: Right

## 2016-04-20 MED ORDER — MIDAZOLAM HCL 2 MG/2ML IJ SOLN
INTRAMUSCULAR | Status: AC
  Filled 2016-04-20: qty 2

## 2016-04-20 MED ORDER — ASPIRIN 81 MG PO CHEW: 81.0000 mg | CHEWABLE_TABLET | ORAL | Status: DC

## 2016-04-20 MED ORDER — SODIUM CHLORIDE 0.9% FLUSH
3.0000 mL | INTRAVENOUS | Status: DC | PRN
Start: 2016-04-20 — End: 2016-04-20

## 2016-04-20 MED ORDER — HEPARIN SODIUM (PORCINE) 1000 UNIT/ML IJ SOLN
INTRAMUSCULAR | Status: AC
  Filled 2016-04-20: qty 1

## 2016-04-20 MED ORDER — SITAGLIPTIN PHOS-METFORMIN HCL 50-500 MG PO TABS: 1.0000 | ORAL_TABLET | Freq: Every day | ORAL | Status: DC

## 2016-04-20 MED ORDER — ASPIRIN EC 81 MG PO TBEC: 81.0000 mg | DELAYED_RELEASE_TABLET | Freq: Every day | ORAL | Status: DC

## 2016-04-20 MED ORDER — FENTANYL CITRATE (PF) 100 MCG/2ML IJ SOLN
INTRAMUSCULAR | Status: DC | PRN
  Administered 2016-04-20 (×2): 25 ug via INTRAVENOUS

## 2016-04-20 MED ORDER — LIDOCAINE HCL (PF) 1 % IJ SOLN
INTRAMUSCULAR | Status: DC | PRN
  Administered 2016-04-20: 26 mL

## 2016-04-20 MED ORDER — MIDAZOLAM HCL 2 MG/2ML IJ SOLN
INTRAMUSCULAR | Status: DC | PRN
  Administered 2016-04-20 (×2): 1 mg via INTRAVENOUS

## 2016-04-20 MED ORDER — CLOPIDOGREL BISULFATE 75 MG PO TABS: 75.0000 mg | ORAL_TABLET | Freq: Every day | ORAL | Status: DC

## 2016-04-20 MED ORDER — ONDANSETRON HCL 4 MG/2ML IJ SOLN: 4.0000 mg | Freq: Four times a day (QID) | INTRAMUSCULAR | Status: DC | PRN

## 2016-04-20 MED ORDER — LORATADINE 10 MG PO TABS
10.0000 mg | ORAL_TABLET | Freq: Every day | ORAL | Status: DC
  Administered 2016-04-20: 10 mg via ORAL
  Filled 2016-04-20 (×2): qty 1

## 2016-04-20 MED ORDER — MORPHINE SULFATE (PF) 4 MG/ML IV SOLN: 2.0000 mg | INTRAVENOUS | Status: DC | PRN

## 2016-04-20 MED ORDER — ASPIRIN EC 81 MG PO TBEC
81.0000 mg | DELAYED_RELEASE_TABLET | Freq: Every day | ORAL | Status: DC
  Administered 2016-04-21: 81 mg via ORAL
  Filled 2016-04-20 (×2): qty 1

## 2016-04-20 MED ORDER — HEPARIN (PORCINE) IN NACL 2-0.9 UNIT/ML-% IJ SOLN
INTRAMUSCULAR | Status: DC | PRN
  Administered 2016-04-20: 1000 mL

## 2016-04-20 MED ORDER — LINAGLIPTIN 5 MG PO TABS
5.0000 mg | ORAL_TABLET | Freq: Every day | ORAL | Status: DC
  Administered 2016-04-21: 5 mg via ORAL
  Filled 2016-04-20 (×2): qty 1

## 2016-04-20 MED ORDER — CLOPIDOGREL BISULFATE 75 MG PO TABS
75.0000 mg | ORAL_TABLET | Freq: Every day | ORAL | Status: DC
  Administered 2016-04-21: 75 mg via ORAL
  Filled 2016-04-20: qty 1

## 2016-04-20 MED ORDER — HEPARIN SODIUM (PORCINE) 1000 UNIT/ML IJ SOLN
INTRAMUSCULAR | Status: DC | PRN
  Administered 2016-04-20: 6000 [IU] via INTRAVENOUS
  Administered 2016-04-20: 3000 [IU] via INTRAVENOUS

## 2016-04-20 MED ORDER — HEPARIN (PORCINE) IN NACL 2-0.9 UNIT/ML-% IJ SOLN
INTRAMUSCULAR | Status: AC
  Filled 2016-04-20: qty 1000

## 2016-04-20 MED ORDER — GABAPENTIN 300 MG PO CAPS
300.0000 mg | ORAL_CAPSULE | Freq: Every evening | ORAL | Status: DC
  Administered 2016-04-20: 300 mg via ORAL
  Filled 2016-04-20: qty 1

## 2016-04-20 MED ORDER — ATORVASTATIN CALCIUM 40 MG PO TABS
40.0000 mg | ORAL_TABLET | Freq: Every evening | ORAL | Status: DC
  Administered 2016-04-20: 40 mg via ORAL
  Filled 2016-04-20: qty 1

## 2016-04-20 MED ORDER — LULICONAZOLE 1 % EX CREA: 1.0000 "application " | TOPICAL_CREAM | Freq: Every day | CUTANEOUS | Status: DC | PRN

## 2016-04-20 MED ORDER — LISINOPRIL 5 MG PO TABS
5.0000 mg | ORAL_TABLET | Freq: Every day | ORAL | Status: DC
  Administered 2016-04-21: 5 mg via ORAL
  Filled 2016-04-20: qty 1

## 2016-04-20 MED ORDER — METFORMIN HCL 500 MG PO TABS: 500.0000 mg | ORAL_TABLET | Freq: Every day | ORAL | Status: DC

## 2016-04-20 MED ORDER — LIDOCAINE HCL (PF) 1 % IJ SOLN
INTRAMUSCULAR | Status: AC
  Filled 2016-04-20: qty 30

## 2016-04-20 MED ORDER — FENTANYL CITRATE (PF) 100 MCG/2ML IJ SOLN
INTRAMUSCULAR | Status: AC
  Filled 2016-04-20: qty 2

## 2016-04-20 MED ORDER — SODIUM CHLORIDE 0.9 % WEIGHT BASED INFUSION
3.0000 mL/kg/h | INTRAVENOUS | Status: DC
  Administered 2016-04-20: 3 mL/kg/h via INTRAVENOUS

## 2016-04-20 MED ORDER — DILTIAZEM HCL ER COATED BEADS 120 MG PO CP24
120.0000 mg | ORAL_CAPSULE | Freq: Every day | ORAL | Status: DC
  Administered 2016-04-20 – 2016-04-21 (×2): 120 mg via ORAL
  Filled 2016-04-20 (×2): qty 1

## 2016-04-20 MED ORDER — SODIUM CHLORIDE 0.9 % WEIGHT BASED INFUSION: 1.0000 mL/kg/h | INTRAVENOUS | Status: DC

## 2016-04-20 MED ORDER — HYDRALAZINE HCL 20 MG/ML IJ SOLN: 10.0000 mg | INTRAMUSCULAR | Status: DC | PRN

## 2016-04-20 MED ORDER — ACETAMINOPHEN 325 MG PO TABS: 650.0000 mg | ORAL_TABLET | ORAL | Status: DC | PRN

## 2016-04-20 MED ORDER — SILDENAFIL CITRATE 100 MG PO TABS: 100.0000 mg | ORAL_TABLET | Freq: Every day | ORAL | Status: DC | PRN

## 2016-04-20 MED ORDER — IODIXANOL 320 MG/ML IV SOLN
INTRAVENOUS | Status: DC | PRN
  Administered 2016-04-20: 55 mL via INTRAVENOUS

## 2016-04-20 MED ORDER — SODIUM CHLORIDE 0.9 % IV SOLN: INTRAVENOUS | Status: AC

## 2016-04-20 SURGICAL SUPPLY — 20 items
BALLN MUSTANG 5X100X135 (BALLOONS) ×4
BALLOON MUSTANG 5X100X135 (BALLOONS) ×2 IMPLANT
CATH STRAIGHT 5FR 65CM (CATHETERS) ×4 IMPLANT
CATH TEMPO 5F RIM 65CM (CATHETERS) ×3 IMPLANT
DEVICE CONTINUOUS FLUSH (MISCELLANEOUS) ×4 IMPLANT
GUIDEWIRE ANGLED .035X150CM (WIRE) ×4 IMPLANT
KIT ENCORE 26 ADVANTAGE (KITS) ×4 IMPLANT
KIT PV (KITS) ×4 IMPLANT
SHEATH PINNACLE 5F 10CM (SHEATH) ×4 IMPLANT
SHEATH PINNACLE 6F 10CM (SHEATH) ×4 IMPLANT
SHEATH PINNACLE ST 6F 45CM (SHEATH) ×4 IMPLANT
STENT ZILVER PTX 6X120 (Permanent Stent) ×4 IMPLANT
TAPE RADIOPAQUE TURBO (MISCELLANEOUS) ×4 IMPLANT
TRANSDUCER W/STOPCOCK (MISCELLANEOUS) ×4 IMPLANT
TRAY PV CATH (CUSTOM PROCEDURE TRAY) ×4 IMPLANT
TUBING CIL FLEX 10 FLL-RA (TUBING) ×4 IMPLANT
WIRE HITORQ VERSACORE ST 145CM (WIRE) ×4 IMPLANT
WIRE ROSEN-J .035X180CM (WIRE) ×4 IMPLANT
WIRE SPARTACORE .014X300CM (WIRE) ×4 IMPLANT
WIRE VERSACORE LOC 115CM (WIRE) ×4 IMPLANT

## 2016-04-20 NOTE — Progress Notes (Signed)
Erin,PA in to see client and checked left groin and then Arlys JohnBrian from cath lab in to check left groin

## 2016-04-20 NOTE — Progress Notes (Signed)
[facility] 2[facility]w-29 and transported via bed

## 2016-04-20 NOTE — Progress Notes (Signed)
On arrival from cath lab holding area left groin swollen and Vernona RiegerLaura RN held pressure x 15 min and groin softer and pressure dressing placed by Richardson DoppLaura Murphy,RN

## 2016-04-20 NOTE — Progress Notes (Signed)
Site area: Left groin a 6 french arterial sheath was removed  Site Prior to Removal:  Level 0  Pressure Applied For 20 MINUTES    Bedrest Beginning at 1015am  Manual:   Yes.    Patient Status During Pull:  stable  Post Pull Groin Site:  Level 0  Post Pull Instructions Given:  Yes.    Post Pull Pulses Present:  Yes.    Dressing Applied:  Yes.    Comments:  VS remain stable during sheath pull

## 2016-04-20 NOTE — Progress Notes (Signed)
Groin checked, slight swelling noted in left groin. Patient has left groin hernia present. Patient complains of some pain with palpation. Manual pressure applied for 15 minutes. Groin softer post hold. Patient has less pain. Left dp weak but palpable.

## 2016-04-21 ENCOUNTER — Encounter (HOSPITAL_COMMUNITY): Payer: Self-pay | Admitting: Cardiovascular Disease

## 2016-04-21 ENCOUNTER — Other Ambulatory Visit: Payer: Self-pay | Admitting: Cardiovascular Disease

## 2016-04-21 ENCOUNTER — Other Ambulatory Visit: Payer: Self-pay | Admitting: Physician Assistant

## 2016-04-21 DIAGNOSIS — I70211 Atherosclerosis of native arteries of extremities with intermittent claudication, right leg: Secondary | ICD-10-CM | POA: Diagnosis not present

## 2016-04-21 DIAGNOSIS — I739 Peripheral vascular disease, unspecified: Secondary | ICD-10-CM | POA: Diagnosis not present

## 2016-04-21 NOTE — Progress Notes (Signed)
Anthony Brock to be D/C'd Home per MD order. Discussed with the patient and all questions fully answered.  Allergies as of 04/21/2016   No Known Allergies     Medication List    TAKE these medications   aspirin EC 81 MG tablet Take 1 tablet (81 mg total) by mouth daily.   atorvastatin 40 MG tablet Commonly known as:  LIPITOR TAKE 1 TABLET BY MOUTH EVERY EVENING   cetirizine 10 MG tablet Commonly known as:  ZYRTEC Take 10 mg by mouth daily as needed for allergies.   clopidogrel 75 MG tablet Commonly known as:  PLAVIX TAKE 1 TABLET BY MOUTH DAILY   diltiazem 120 MG 24 hr capsule Commonly known as:  CARDIZEM CD Take 1 capsule (120 mg total) by mouth daily. What changed:  when to take this   gabapentin 300 MG capsule Commonly known as:  NEURONTIN Take 300 mg by mouth every evening.   JANUMET 50-500 MG tablet Generic drug:  sitaGLIPtin-metformin Take 1 tablet by mouth daily before breakfast.   lisinopril 5 MG tablet Commonly known as:  PRINIVIL,ZESTRIL TAKE ONE (1) TABLET BY MOUTH EVERY DAY What changed:  See the new instructions.   LUZU 1 % Crea Generic drug:  Luliconazole Apply 1 application topically daily as needed (athletes foot).   ONE TOUCH ULTRA MINI w/Device Kit   ONE TOUCH ULTRA TEST test strip Generic drug:  glucose blood   ONETOUCH DELICA LANCETS 13H Misc   VIAGRA 100 MG tablet Generic drug:  sildenafil Take 1 tablet (100 mg total) by mouth daily as needed for erectile dysfunction.       VVS, Skin clean, dry and intact without evidence of skin break down, no evidence of skin tears noted.  IV catheter discontinued intact. Site without signs and symptoms of complications. Dressing and pressure applied.  An After Visit Summary was printed and given to the patient.  Patient escorted via [place], and D/C home via private auto.  Cyndra Numbers  04/21/2016 4:30 PM

## 2016-04-21 NOTE — Progress Notes (Signed)
Patient Name: Anthony Brock Date of Encounter: 04/21/2016  Primary Cardiologist: Dr. Crista CurbBerry  Hospital Problem List     Active Problems:   PAD (peripheral artery disease) (HCC)   PVD (peripheral vascular disease) (HCC)     Subjective   Feeling well. No chest pain, sob or palpitations. No groin pain.   Inpatient Medications    Scheduled Meds: . aspirin EC  81 mg Oral Daily  . atorvastatin  40 mg Oral QPM  . clopidogrel  75 mg Oral Q breakfast  . diltiazem  120 mg Oral Daily  . gabapentin  300 mg Oral QPM  . linagliptin  5 mg Oral Daily   And  . [START ON 04/23/2016] metFORMIN  500 mg Oral Q breakfast  . lisinopril  5 mg Oral Daily  . loratadine  10 mg Oral Daily   Continuous Infusions:  PRN Meds: acetaminophen, hydrALAZINE, morphine injection, ondansetron (ZOFRAN) IV   Vital Signs    Vitals:   04/20/16 1336 04/20/16 1354 04/20/16 2026 04/21/16 0600  BP: 130/65 126/65 (!) 149/63 127/79  Pulse: (!) 52 (!) 52 68 66  Resp:  18 18 18   Temp:  98.2 F (36.8 C) 98.5 F (36.9 C) 98.6 F (37 C)  TempSrc:  Oral Oral Oral  SpO2: 96% 97% 93% 95%  Weight:      Height:        Intake/Output Summary (Last 24 hours) at 04/21/16 0916 Last data filed at 04/20/16 1700  Gross per 24 hour  Intake              240 ml  Output                0 ml  Net              240 ml   Filed Weights   04/20/16 0538  Weight: 186 lb (84.4 kg)    Physical Exam    GEN: Well nourished, well developed, in no acute distress.  HEENT: Grossly normal.  Neck: Supple, no JVD, carotid bruits, or masses. Cardiac: RRR, no murmurs, rubs, or gallops. No clubbing, cyanosis, edema.  Radials/DP/PT 2+ and equal bilaterally.  L groin without hematoma or bruit. Has L groin hernia.  Respiratory:  Respirations regular and unlabored, clear to auscultation bilaterally. GI: Soft, nontender, nondistended, BS + x 4. MS: no deformity or atrophy. Skin: warm and dry, no rash. Neuro:  Strength and sensation are  intact. Psych: AAOx3.  Normal affect.  Labs    CBC No results for input(s): WBC, NEUTROABS, HGB, HCT, MCV, PLT in the last 72 hours. Basic Metabolic Panel No results for input(s): NA, K, CL, CO2, GLUCOSE, BUN, CREATININE, CALCIUM, MG, PHOS in the last 72 hours. Liver Function Tests No results for input(s): AST, ALT, ALKPHOS, BILITOT, PROT, ALBUMIN in the last 72 hours. No results for input(s): LIPASE, AMYLASE in the last 72 hours. Cardiac Enzymes No results for input(s): CKTOTAL, CKMB, CKMBINDEX, TROPONINI in the last 72 hours. BNP Invalid input(s): POCBNP D-Dimer No results for input(s): DDIMER in the last 72 hours. Hemoglobin A1C No results for input(s): HGBA1C in the last 72 hours. Fasting Lipid Panel No results for input(s): CHOL, HDL, LDLCALC, TRIG, CHOLHDL, LDLDIRECT in the last 72 hours. Thyroid Function Tests No results for input(s): TSH, T4TOTAL, T3FREE, THYROIDAB in the last 72 hours.  Invalid input(s): FREET3  Telemetry    NSR - Personally Reviewed  ECG    N.A - Personally Reviewed  Radiology  No results found.  Cardiac Studies   PV Angiogram/Intervention 04/21/15  Procedures Performed:            1. Left common femoral access on the contralateral access (second order catheter placement).            2. PTA right SFA            3. Drug-eluting stent right SFA  Final Impression: Successful PTA and drug eluting stenting of high grade segmental mid right SFA stenosis in the setting of prior directional atherectomy and drug-eluting balloon angioplasty with restenosis twice. The patient will be discharged home today. He will [place] it Dopplers [place] next week and I will see him back in 2-3 weeks thereafter.  Patient Profile     Mr. Broadnax is a 61 year old married African-American veteran who works at [employer] with hx of PVD, carotid artery disease, HTN and HLD who present for scheduled outpatient PV angiogram/intervension.   Assessment &  Plan    1. PAD -  Now s/p successful PTA and drug eluting stenting of high grade segmental mid right SFA stenosis. Recent PTA and DEs to left SFA 03/23/16. - Get outpatient doppler followed by appointment with Dr. Allyson Sabal. Continue current regiment. Had swollen left groin post PV procedure  prompted to overnight observation. No hematoma today. Ambulate. Discharge later today.   2. Left groin hernia - has chron left groin hernia. He has plan to f/u with PCP and then surgeon for repair in near future.  Hx of r groin hernia repair.     Signed, Manson Passey, PA  04/21/2016, 9:16 AM

## 2016-04-21 NOTE — Discharge Summary (Signed)
   Discharge Summary    Patient ID: Anthony Brock,  MRN: [id number], DOB/AGE: 04/20/1955 61 y.o.  Admit date: 04/20/2016 Discharge date: 04/21/2016  Primary Care Provider: RICHTER,KAREN L. Primary Cardiologist: Berry   Discharge Diagnoses    Active Problems:   PAD (peripheral artery disease) (HCC)   PVD (peripheral vascular disease) (HCC)   Allergies No Known Allergies  Diagnostic Studies/Procedures     PV Angiogram/Intervention 04/21/15  Procedures Performed: 1. Left common femoral access on the contralateral access (second order catheter placement). 2. PTA right SFA 3. Drug-eluting stent right SFA  Final Impression:Successful PTA and drug eluting stenting of high grade segmental mid right SFA stenosis in the setting of prior directional atherectomy and drug-eluting balloon angioplasty with restenosis twice. The patient will be discharged home today. He will get it Dopplers [place] next [place] and I will see him back in 2-3 weeks thereafter.   History of Present Illness       Anthony Brock is a 61-year-old married African-American veteran who works at [employer] with hx of PVD, carotid artery disease, HTN and HLD who present for scheduled outpatient PV angiogram/intervension.   Recentlower extremity Dopplers performed 01/22/16 revealed recurrent bilateral SFA stenosis with slightly lower ABIs. He does complain of some mild claudication. Carotid Dopplers performed 01/24/16 revealed a widely patent left internal carotid artery stent.  Re-angiogram 03/23/16 revealing 90% "restenosis" within both SFAs. S/p PTA and stenting using a 6 mm x 80 mm long Zilver drug-eluting stent on his proximal left SFA. Left leg is symptomatically improved. He has residual disease on the right and wishes to proceed with similar revascularization.  Hospital Course     Consultants: None  Pt presented PV angiogram. Now s/p successful PTA and drug  eluting stenting of high grade segmental mid right SFA stenosis. Had swollen left groin post PV procedure  prompted to overnight observation. No hematoma. Ambulated well. He has chronic left groin hernia. He has plan to f/u with PCP and then surgeon for repair in near future.  Hx of r groin hernia repair.   The patient has been seen by Dr. [place] today and deemed ready for discharge home. All follow-up appointments have been scheduled. Discharge medications are listed below.  Discharge Vitals Blood pressure 127/79, pulse 66, temperature 98.6 F (37 C), temperature source Oral, resp. rate 18, height 6' 1" (1.854 m), weight 186 lb (84.4 kg), SpO2 95 %.  Filed Weights   04/20/16 0538  Weight: 186 lb (84.4 kg)    Labs & Radiologic Studies     CBC No results for input(s): WBC, NEUTROABS, HGB, HCT, MCV, PLT in the last 72 hours. Basic Metabolic Panel No results for input(s): NA, K, CL, CO2, GLUCOSE, BUN, CREATININE, CALCIUM, MG, PHOS in the last 72 hours. Liver Function Tests No results for input(s): AST, ALT, ALKPHOS, BILITOT, PROT, ALBUMIN in the last 72 hours. No results for input(s): LIPASE, AMYLASE in the last 72 hours. Cardiac Enzymes No results for input(s): CKTOTAL, CKMB, CKMBINDEX, TROPONINI in the last 72 hours. BNP Invalid input(s): POCBNP D-Dimer No results for input(s): DDIMER in the last 72 hours. Hemoglobin A1C No results for input(s): HGBA1C in the last 72 hours. Fasting Lipid Panel No results for input(s): CHOL, HDL, LDLCALC, TRIG, CHOLHDL, LDLDIRECT in the last 72 hours. Thyroid Function Tests No results for input(s): TSH, T4TOTAL, T3FREE, THYROIDAB in the last 72 hours.  Invalid input(s): FREET3  No results found.  Disposition   Pt   is being discharged home today in good condition.  Follow-up Plans & Appointments    Follow-up Information    [facility]. Go on 05/06/2016.   Specialty:  Cardiology Why:  @ 8am for LE arterial doppler Contact  information: [address] [place] [address] [phone]       Berry, Jonathan, MD. Go on 05/20/2016.   Specialties:  Cardiology, Radiology Why:  @11:00 am for post hosptial  Contact information: [address] [place] [address] [phone]          Discharge Instructions    Diet - low sodium heart healthy    Complete by:  As directed    Discharge instructions    Complete by:  As directed    No driving for 48 hours. No lifting over 5 lbs for 1 week. No sexual activity for 1 week. You may return to work on 04/27/16. Keep procedure site clean & dry. If you notice increased pain, swelling, bleeding or pus, call/return!  You may shower, but no soaking baths/hot tubs/pools for 1 week.  c   Increase activity slowly    Complete by:  As directed       Discharge Medications   Current Discharge Medication List    CONTINUE these medications which have NOT CHANGED   Details  aspirin EC 81 MG tablet Take 1 tablet (81 mg total) by mouth daily.    atorvastatin (LIPITOR) 40 MG tablet TAKE 1 TABLET BY MOUTH EVERY EVENING Qty: 30 tablet, Refills: 6    Blood Glucose Monitoring Suppl (ONE TOUCH ULTRA MINI) w/Device KIT     cetirizine (ZYRTEC) 10 MG tablet Take 10 mg by mouth daily as needed for allergies.     clopidogrel (PLAVIX) 75 MG tablet TAKE 1 TABLET BY MOUTH DAILY Qty: 90 tablet, Refills: 2    diltiazem (CARDIZEM CD) 120 MG 24 hr capsule Take 1 capsule (120 mg total) by mouth daily. Qty: 90 capsule, Refills: 3    gabapentin (NEURONTIN) 300 MG capsule Take 300 mg by mouth every evening.    JANUMET 50-500 MG tablet Take 1 tablet by mouth daily before breakfast.     lisinopril (PRINIVIL,ZESTRIL) 5 MG tablet TAKE ONE (1) TABLET BY MOUTH EVERY DAY Qty: 30 tablet, Refills: 0    LUZU 1 % CREA Apply 1 application topically daily as needed (athletes foot).     ONE TOUCH ULTRA TEST test strip     ONETOUCH DELICA LANCETS 33G MISC      VIAGRA 100 MG tablet Take 1 tablet (100 mg total) by mouth daily as needed for erectile dysfunction. Qty: 10 tablet, Refills: 3          Outstanding Labs/Studies   LE arterial doppler   Duration of Discharge Encounter   Greater than 30 minutes including physician time.  Signed, , PA-C 04/21/2016, 2:07 PM    

## 2016-04-22 ENCOUNTER — Telehealth: Payer: Self-pay | Admitting: Cardiovascular Disease

## 2016-04-22 DIAGNOSIS — R1909 Other intra-abdominal and pelvic swelling, mass and lump: Secondary | ICD-10-CM

## 2016-04-22 NOTE — Telephone Encounter (Signed)
Follow up    Pt verbalized that he is returning call for rn from earlier today about his return to work note

## 2016-04-22 NOTE — Telephone Encounter (Signed)
Didn't realize Dr. Allyson SabalBerry already OOO, spoke to Dr. Royann Shiversroitoru who recommends doppler tomorrow to r/o pseudoaneurysm.  Order placed, will have scheduled ASAP in AM.  Pt made aware.

## 2016-04-22 NOTE — Telephone Encounter (Signed)
Pt calling regarding return to work note, it states 04-27-16, and he thinks that is a mistake- last time he was out about 4 weeks-also having some swelling in his right leg -he had lower extremity Angiogrpahy- [phone] or [phone]

## 2016-04-22 NOTE — Telephone Encounter (Signed)
Returned call to patient-patient d/c from hospital yesterday after PV intervention.  Pt states discharge note states he can return to work on 04/27/16. Patient states this is a mistake and he will not be able to return on Monday.  Requesting a note for work to recover longer.  States his job is physically challenging and doesn't believe he will be able to do his job so soon.  Reports he has had this procedure before and it took longer to recover.  Pt also reports swelling and soreness to left groin area (insertion site).  Denies hematoma, numbness/tingling.      Advised to limit activity as instructed, advised I would send message to MD Allyson SabalBerry for further recommendations.

## 2016-04-23 ENCOUNTER — Ambulatory Visit (HOSPITAL_COMMUNITY)
Admission: RE | Admit: 2016-04-23 | Discharge: 2016-04-23 | Disposition: A | Discharge status: Home / Self Care | Payer: BLUE CROSS/BLUE SHIELD | Source: Ambulatory Visit | Attending: Cardiology | Admitting: Cardiology

## 2016-04-23 ENCOUNTER — Other Ambulatory Visit: Payer: Self-pay | Admitting: Cardiovascular Disease

## 2016-04-23 DIAGNOSIS — M79609 Pain in unspecified limb: Secondary | ICD-10-CM | POA: Diagnosis not present

## 2016-04-23 DIAGNOSIS — R1909 Other intra-abdominal and pelvic swelling, mass and lump: Secondary | ICD-10-CM | POA: Diagnosis present

## 2016-04-23 DIAGNOSIS — I739 Peripheral vascular disease, unspecified: Secondary | ICD-10-CM

## 2016-04-23 NOTE — Telephone Encounter (Signed)
Spoke with Missy-LE [facility]  Pt aware and verbalized understanding.

## 2016-04-23 NOTE — Telephone Encounter (Signed)
Pt at office for doppler to be completed.  Patient f/u regarding return to work date.  Missy reached out to Dr. Allyson SabalBerry who advised patient he could return to work on 1/22.    New work note created and patient made aware it will be at front desk for pick up.  Pt to bring paperwork tomorrow for Dr. Allyson SabalBerry to review and sign.

## 2016-04-24 ENCOUNTER — Telehealth: Payer: Self-pay | Admitting: Cardiovascular Disease

## 2016-04-24 NOTE — Telephone Encounter (Signed)
Spoke to Mr. Anthony Brock's wife--ok per DPR--concerning return to work paper ([employer]). Explained that Dr. Allyson SabalBerry will not be back in the office until 1/23, but that Mr. Anthony Brock can return to work on 1/22 per Dr. Allyson SabalBerry (see previous telephone note).   I just received paperwork and it is ready to be signed. Will have Dr. Allyson SabalBerry sign on 1/23 and ready for Mr. Anthony Brock to p/u on that day.

## 2016-04-27 ENCOUNTER — Telehealth: Payer: Self-pay | Admitting: *Deleted

## 2016-04-27 NOTE — Telephone Encounter (Signed)
Called Mr. Gayla DossJoyner for his 2 year follow-up phone call [facility]. He states he is currently doing well. He had an additional intervention on his leg as documented in EPIC. I will follow-up again in 1 year.

## 2016-05-06 ENCOUNTER — Ambulatory Visit (HOSPITAL_COMMUNITY)
Admission: RE | Admit: 2016-05-06 | Discharge: 2016-05-06 | Disposition: A | Discharge status: Home / Self Care | Payer: BLUE CROSS/BLUE SHIELD | Source: Ambulatory Visit | Attending: Cardiovascular Disease | Admitting: Cardiovascular Disease

## 2016-05-06 DIAGNOSIS — I739 Peripheral vascular disease, unspecified: Secondary | ICD-10-CM | POA: Insufficient documentation

## 2016-05-07 ENCOUNTER — Other Ambulatory Visit: Payer: Self-pay | Admitting: Cardiovascular Disease

## 2016-05-07 DIAGNOSIS — I739 Peripheral vascular disease, unspecified: Secondary | ICD-10-CM

## 2016-05-12 ENCOUNTER — Ambulatory Visit: Payer: Self-pay | Admitting: Surgery

## 2016-05-12 ENCOUNTER — Other Ambulatory Visit: Payer: Self-pay | Admitting: Cardiovascular Disease

## 2016-05-12 DIAGNOSIS — I739 Peripheral vascular disease, unspecified: Secondary | ICD-10-CM

## 2016-05-12 NOTE — H&P (Signed)
Anthony Brock 05/12/2016 10:02 AM Location: [facility] Patient #: [id number] DOB: 06/16/1955 Undefined / Language: [place] / Race: Black or African American Male  History of Present Illness Anthony Brock(Anthony Hawe C. Abia Monaco MD; 05/12/2016 1:34 PM) The patient is a 61 year old male who presents with an inguinal hernia. Note for "Inguinal hernia": Is a male sent for consultation for concern of left inguinal hernia by Anthony. Nadyne CoombesKaren Brock.  Active male. Has some distal peripheral vascular disease. His had carotid stenting in 2016. Finally quit smoking. He had recent placement of a drug-eluting stent in his superficial femoral artery 04/20/2016 from a left groin approach. Apparently during some of this workup with groin swelling. Left inguinal hernia was noted. In 2004 he had a open right inguinal hernia repair with3x6cm [employer] mesh by Anthony Brock without problems. Patient feels like he might had a lump there for the past few years. Not particularly bothersome enlarging. However after his catheterization and become mention more larger and symptomatic. He is a diabetic. Last A1c around 7. Glucose is in the 1 teens. Recovering from his stenting. He is concerned about getting back to work given the moderate groin pain with even light lifting. 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 He is walking much better after the stenting. Moves his bowels twice a day. No history of infections. Has occasional glass of alcohol. Mild reflux. He is only on a aspirin for anticoagulation. Not on any Plavix anymore. He is hoping to get hernia repaired as soon as possible so he can get back to work.   Past Surgical History Anthony Brock(Anthony Brock, [place]; 05/12/2016 10:03 AM) Bypass Surgery for Poor Blood Flow to Legs Carotid Artery Surgery Left. Open Inguinal Hernia Surgery Right.  Diagnostic Studies History Anthony Brock(Anthony Brock, [place]; 05/12/2016 10:03  AM) Colonoscopy never  Allergies Anthony Brock(Anthony Brock, RMA; 05/12/2016 10:03 AM) No Known Drug Allergies 05/12/2016  Medication History Anthony Brock(Anthony Brock, RMA; 05/12/2016 10:04 AM) Atorvastatin Calcium (40MG  Tablet, Oral) Active. Clopidogrel Bisulfate (75MG  Tablet, Oral) Active. DilTIAZem HCl ER Coated Beads (120MG  Capsule ER 24HR, Oral) Active. Gabapentin (300MG  Capsule, Oral) Active. Janumet (50-500MG  Tablet, Oral) Active. Lisinopril (5MG  Tablet, Oral) Active. Luzu (1% Cream, External) Active. OneTouch Ultra Blue (In Vitro) Active. PredniSONE (20MG  Tablet, Oral) Active. Viagra (100MG  Tablet, Oral) Active. Medications Reconciled  Social History Anthony Brock(Anthony Brock, [place]; 05/12/2016 10:03 AM) Alcohol use Occasional alcohol use. Caffeine use Coffee, Tea. Illicit drug use Prefer to discuss with provider. Tobacco use Former smoker.  Family History Anthony Brock(Anthony Brock, [place]; 05/12/2016 10:03 AM) Alcohol Abuse Brother. Arthritis Mother, Sister. Diabetes Mellitus Family Members In General. Heart Disease Father. Heart disease in male family member before age 62 Hypertension Mother. Respiratory Condition Father. Thyroid problems Daughter.  Other Problems Anthony Brock(Anthony Brock, RMA; 05/12/2016 10:03 AM) Gastroesophageal Reflux Disease High blood pressure Inguinal Hernia Vascular Disease     Review of Systems Anthony Brock(Anthony Brock RMA; 05/12/2016 10:03 AM) General Not Present- Appetite Loss, Chills, Fatigue, Fever, Night Sweats, Weight Gain and Weight Loss. Skin Not Present- Change in Wart/Mole, Dryness, Hives, Jaundice, New Lesions, Non-Healing Wounds, Rash and Ulcer. HEENT Present- Seasonal Allergies and Wears glasses/contact lenses. Not Present- Earache, Hearing Loss, Hoarseness, Nose Bleed, Oral Ulcers, Ringing in the Ears, Sinus Pain, Sore Throat, Visual Disturbances and Yellow Eyes. Respiratory Present- Snoring. Not Present- Bloody sputum, Chronic Cough, Difficulty  Breathing and Wheezing. Breast Not Present- Breast Mass, Breast Pain, Nipple Discharge and Skin Changes. Cardiovascular Not Present- Chest  Pain, Difficulty Breathing Lying Down, Leg Cramps, Palpitations, Rapid Heart Rate, Shortness of Breath and Swelling of Extremities. Gastrointestinal Present- Indigestion. Not Present- Abdominal Pain, Bloating, Bloody Stool, Change in Bowel Habits, Chronic diarrhea, Constipation, Difficulty Swallowing, Excessive gas, Gets full quickly at meals, Hemorrhoids, Nausea, Rectal Pain and Vomiting. Male Genitourinary Not Present- Blood in Urine, Change in Urinary Stream, Frequency, Impotence, Nocturia, Painful Urination, Urgency and Urine Leakage. Musculoskeletal Present- Swelling of Extremities. Not Present- Back Pain, Joint Pain, Joint Stiffness, Muscle Pain and Muscle Weakness. Neurological Not Present- Decreased Memory, Fainting, Headaches, Numbness, Seizures, Tingling, Tremor, Trouble walking and Weakness. Psychiatric Not Present- Anxiety, Bipolar, Change in Sleep Pattern, Depression, Fearful and Frequent crying. Endocrine Present- New Diabetes. Not Present- Cold Intolerance, Excessive Hunger, Hair Changes, Heat Intolerance and Hot flashes. Hematology Not Present- Blood Thinners, Easy Bruising, Excessive bleeding, Gland problems, HIV and Persistent Infections.  Vitals Anthony Brock RMA; 05/12/2016 10:05 AM) 05/12/2016 10:04 AM Weight: 190 lb Height: 73in Body Surface Area: 2.11 m Body Mass Index: 25.07 kg/m  Temp.: 81F  Pulse: 57 (Regular)  BP: 160/80 (Sitting, Left Arm, Standard)      Physical Exam Anthony Sportsman MD; 05/12/2016 1:35 PM)  General Mental Status-Alert. General Appearance-Not in acute distress, Not Sickly. Orientation-Oriented X3. Hydration-Well hydrated. Voice-Normal.  Integumentary Global Assessment Upon inspection and palpation of skin surfaces of the - Axillae: non-tender, no inflammation or ulceration, no  drainage. and Distribution of scalp and body hair is normal. General Characteristics Temperature - normal warmth is noted.  Head and Neck Head-normocephalic, atraumatic with no lesions or palpable masses. Face Global Assessment - atraumatic, no absence of expression. Neck Global Assessment - no abnormal movements, no bruit auscultated on the right, no bruit auscultated on the left, no decreased range of motion, non-tender. Trachea-midline. Thyroid Gland Characteristics - non-tender.  Eye Eyeball - Left-Extraocular movements intact, No Nystagmus. Eyeball - Right-Extraocular movements intact, No Nystagmus. Cornea - Left-No Hazy. Cornea - Right-No Hazy. Sclera/Conjunctiva - Left-No scleral icterus, No Discharge. Sclera/Conjunctiva - Right-No scleral icterus, No Discharge. Pupil - Left-Direct reaction to light normal. Pupil - Right-Direct reaction to light normal.  ENMT Ears Pinna - Left - no drainage observed, no generalized tenderness observed. Right - no drainage observed, no generalized tenderness observed. Nose and Sinuses External Inspection of the Nose - no destructive lesion observed. Inspection of the nares - Left - quiet respiration. Right - quiet respiration. Mouth and Throat Lips - Upper Lip - no fissures observed, no pallor noted. Lower Lip - no fissures observed, no pallor noted. Nasopharynx - no discharge present. Oral Cavity/Oropharynx - Tongue - no dryness observed. Oral Mucosa - no cyanosis observed. Hypopharynx - no evidence of airway distress observed.  Chest and Lung Exam Inspection Movements - Normal and Symmetrical. Accessory muscles - No use of accessory muscles in breathing. Palpation Palpation of the chest reveals - Non-tender. Auscultation Breath sounds - Normal and Clear.  Cardiovascular Auscultation Rhythm - Regular. Murmurs & Other Heart Sounds - Auscultation of the heart reveals - No Murmurs and No Systolic  Clicks.  Abdomen Inspection Inspection of the abdomen reveals - No Visible peristalsis and No Abnormal pulsations. Umbilicus - No Bleeding, No Urine drainage. Palpation/Percussion Palpation and Percussion of the abdomen reveal - Soft, Non Tender, No Rebound tenderness, No Rigidity (guarding) and No Cutaneous hyperesthesia. Note: Abdomen soft. Nontender, nondistended. No guarding. No diastasis. No umbilical nor other hernias  Male Genitourinary Sexual Maturity Tanner 5 - Adult hair pattern and Adult penile size and shape. Note:  Obvious left groin bulge. Sensitive but reducible left inguinal hernia. Well-healed cutdown incision from prior femoral artery catheterization earlier this month. No hematoma or seroma.    Old incision at right groin with no evidence of recurrent right inguinal hernia. Normal external genitalia. Uncircumcised. Epididymi, testes, and spermatic cords normal without any masses.  Peripheral Vascular Upper Extremity Inspection - Left - No Cyanotic nailbeds, Not Ischemic. Right - No Cyanotic nailbeds, Not Ischemic.  Neurologic Neurologic evaluation reveals -normal attention span and ability to concentrate, able to name objects and repeat phrases. Appropriate fund of knowledge , normal sensation and normal coordination. Mental Status Affect - not angry, not paranoid. Cranial Nerves-Normal Bilaterally. Gait-Normal.  Neuropsychiatric Mental status exam performed with findings of-able to articulate well with normal speech/language, rate, volume and coherence, thought content normal with ability to perform basic computations and apply abstract reasoning and no evidence of hallucinations, delusions, obsessions or homicidal/suicidal ideation.  Musculoskeletal Global Assessment Spine, Ribs and Pelvis - no instability, subluxation or laxity. Right Upper Extremity - no instability, subluxation or laxity.  Lymphatic Head & Neck  General Head & Neck  Lymphatics: Bilateral - Description - No Localized lymphadenopathy. Axillary  General Axillary Region: Bilateral - Description - No Localized lymphadenopathy. Femoral & Inguinal  Generalized Femoral & Inguinal Lymphatics: Left - Description - No Localized lymphadenopathy. Right - Description - No Localized lymphadenopathy.    Assessment & Plan Anthony Sportsman MD; 05/12/2016 1:38 PM)  LEFT INGUINAL HERNIA (K40.90) Impression: Reducible left inguinal hernia causing some discomfort and limiting ability to do intense work job.  I think he would benefit from surgical repair. I think it's reasonable to do light duty with less than 20 pounds lifting until surgery is done, return to light duty about 2-3 weeks from surgery most likely unrestricted 6 weeks after surgery. He suspects he won't be able to get back to his job since they will require an unlimited heavy lifting until the hernia repair is done and he is released from surgery. Therefore, we will try and expedite surgical repair.  Cardiac clearance since he just had stenting of a femoral artery. He's only on aspirin now, so I suspect they will not delay surgery but would like to double check with his cardiologist, Anthony. Allyson Sabal. Once cleared, plan surgery. Try and do it sooner to minimize his time out of work.  Reasonable to control his pain with Tylenol and Ice / Heat until surgery can be done  Current Plans I recommended obtaining preoperative cardiac clearance. I am concerned about the health of the patient and the ability to tolerate the operation. History of atherosclerotic disease status post stenting of superficial femoral artery earlier this month. Therefore, we will request clearance by cardiology to better assess operative risk & see if a reevaluation, further workup, etc is needed. Also recommendations on how medications such as for anticoagulation and blood pressure should be managed/held/restarted after surgery. Pt Education - [facility]  Pain control - tylenol only: discussed with patient and provided information. PREOP - ING HERNIA - ENCOUNTER FOR PREOPERATIVE EXAMINATION FOR GENERAL SURGICAL PROCEDURE (Z01.818)  Current Plans You are being scheduled for surgery- Our schedulers will call you.  You should hear from our office's scheduling department within 5 working days about the location, date, and time of surgery. We try to make accommodations for patient's preferences in scheduling surgery, but sometimes the OR schedule or the surgeon's schedule prevents [place] from making those accommodations.  If you have not heard from our office [phone])  in 5 working days, call the office and ask for your surgeon's nurse.  If you have other questions about your diagnosis, plan, or surgery, call the office and ask for your surgeon's nurse.  Written instructions provided The anatomy & physiology of the abdominal wall and pelvic floor was discussed. The pathophysiology of hernias in the inguinal and pelvic region was discussed. Natural history risks such as progressive enlargement, pain, incarceration, and strangulation was discussed. Contributors to complications such as smoking, obesity, diabetes, prior surgery, etc were discussed.  I feel the risks of no intervention will lead to serious problems that outweigh the operative risks; therefore, I recommended surgery to reduce and repair the hernia. I explained laparoscopic techniques with possible need for an open approach. I noted usual use of mesh to patch and/or buttress hernia repair  Risks such as bleeding, infection, abscess, need for further treatment, heart attack, death, and other risks were discussed. I noted a good likelihood this will help address the problem. Goals of post-operative recovery were discussed as well. Possibility that this will not correct all symptoms was explained. I stressed the importance of low-impact activity, aggressive pain control, avoiding  constipation, & not pushing through pain to minimize risk of post-operative chronic pain or injury. Possibility of reherniation was discussed. We will work to minimize complications.  An educational handout further explaining the pathology & treatment options was given as well. Questions were answered. The patient expresses understanding & wishes to proceed with surgery.  Pt Education - Pamphlet Given - Laparoscopic Hernia Repair: discussed with patient and provided information. Pt Education - CCS Hernia Post-Op HCI (Jalan Bodi): discussed with patient and provided information.  Anthony Brock, M.D., F.A.C.S. Gastrointestinal and Minimally Invasive Surgery [facility] [address], [address] [place], [place] [address] [phone] Main / Paging

## 2016-05-20 ENCOUNTER — Encounter: Payer: Self-pay | Admitting: Cardiovascular Disease

## 2016-05-20 ENCOUNTER — Ambulatory Visit (INDEPENDENT_AMBULATORY_CARE_PROVIDER_SITE_OTHER): Payer: BLUE CROSS/BLUE SHIELD | Admitting: Cardiovascular Disease

## 2016-05-20 DIAGNOSIS — E785 Hyperlipidemia, unspecified: Secondary | ICD-10-CM | POA: Diagnosis not present

## 2016-05-20 DIAGNOSIS — I739 Peripheral vascular disease, unspecified: Secondary | ICD-10-CM

## 2016-05-20 DIAGNOSIS — Z720 Tobacco use: Secondary | ICD-10-CM

## 2016-05-20 DIAGNOSIS — I10 Essential (primary) hypertension: Secondary | ICD-10-CM

## 2016-05-20 DIAGNOSIS — I6522 Occlusion and stenosis of left carotid artery: Secondary | ICD-10-CM

## 2016-05-20 NOTE — Patient Instructions (Signed)
Medication Instructions: Your physician recommends that you continue on your current medications as directed. Please refer to the Current Medication list given to you today.   STOP Plavix 7 days prior to Hernia repair.   Labwork: Your physician recommends that you return for a FASTING lipid profile and hepatic function panel.   Testing/Procedures: Schedule Carotid doppler for October 2018.  Schedule LEA/ABI doppler for July 2018.  Follow-Up: Your physician wants you to follow-up in: 6 months with Dr. Allyson SabalBerry. You will receive a reminder letter in the mail two months in advance. If you don't receive a letter, please call our office to schedule the follow-up appointment.  If you need a refill on your cardiac medications before your next appointment, please call your pharmacy.

## 2016-05-20 NOTE — Progress Notes (Signed)
05/20/2016 Anthony Brock   08-02-1955  [id number]  Primary Physician Anthony Brock., MD Primary Cardiologist: Anthony Harp MD Anthony Brock  HPI:  Anthony Brock is a 61 year old married African-American veteran who works at [employer]. He was referred by Dr. Melony Brock at [facility] for peripheral vascular evaluation because of lifestyle limiting claudication.I last saw him in the office 03/30/16. He had carotid stenting by myself 07/17/14.[name] He has a long history of tobacco abuse smoking three-quarter pack a day for last 30 years having recently stopped smoking after his last peripheral vascular procedure.. He has never had a heart attack or stroke, and denies chest pain or shortness of breath. He states that he has had left greater than right lower extremity claudication which is lifestyle limiting for several years. Recent Dopplers performed in our office 03/20/14 revealed a right ABI 0.58 with an occluded distal right SFA and a left ABI 0.41 with a high-frequency signal in his left SFA. He underwent angiography and intervention on January 18 revealing a 80% Left internal carotid artery stenosis, and short segment occlusions in both SFAs. I was able to perform directional atherectomy on his left SFA followed by drug eluding balloon angioplasty. He had an excellent angiographic and clinical result. He ultimately underwent staged right SFA intervention with excellent angiographic and clinical result. He underwent elective left internal carotid artery stenting for high-grade (80%) asymptomatic left internal carotid artery stenosis 07/17/14. His recent carotid Dopplers performed 02/07/15 reveals to be widely patent. He does have recurrent claudication with Dopplers performed 02/07/15 revealing high-grade distal bilateral SFA restenosis. He underwent right SFA directional arthrectomy and drug-eluting balloon angioplasty on 02/15/15 with excellent angiographic and clinical result. He underwent  staged left SFA intervention by myself using directional atherectomy and drug-eluting balloon angioplasty on 02/25/15. Dopplers performed 2 weeks later that showed ABIs of 0.9 bilaterally. Recentlower extremity Dopplers performed 01/22/16 revealed recurrent bilateral SFA stenosis with slightly lower ABIs. He does complain of some mild claudication. Carotid Dopplers performed 01/24/16 revealed a widely patent left internal carotid artery stent. Because of these findings I decided to proceed with PTA and drug eluding stenting using Zilver PTX  of both SFAs. I intervened on the left SFA on December 11. He underwent right SFA intervention 04/20/16. Follow-up lower extremity arterial Doppler studies performed 05/06/16 revealed widely patent stents with normal ABIs. His claudication has resolved.   Current Outpatient Prescriptions  Medication Sig Dispense Refill  . aspirin EC 81 MG tablet Take 1 tablet (81 mg total) by mouth daily.    [name] atorvastatin (LIPITOR) 40 MG tablet TAKE 1 TABLET BY MOUTH EVERY EVENING 30 tablet 6  . Blood Glucose Monitoring Suppl (ONE TOUCH ULTRA MINI) w/Device KIT     . cetirizine (ZYRTEC) 10 MG tablet Take 10 mg by mouth daily as needed for allergies.     [name] clopidogrel (PLAVIX) 75 MG tablet TAKE 1 TABLET BY MOUTH DAILY 90 tablet 2  . diltiazem (CARDIZEM CD) 120 MG 24 hr capsule Take 1 capsule (120 mg total) by mouth daily. (Patient taking differently: Take 120 mg by mouth every evening. ) 90 capsule 3  . gabapentin (NEURONTIN) 300 MG capsule Take 300 mg by mouth every evening.    [name] JANUMET 50-500 MG tablet Take 1 tablet by mouth daily before breakfast.     . lisinopril (PRINIVIL,ZESTRIL) 5 MG tablet TAKE ONE (1) TABLET BY MOUTH EVERY DAY (Patient taking differently: Take 32ms twice daily) 30 tablet 0  .  LUZU 1 % CREA Apply 1 application topically daily as needed (athletes foot).     . ONE TOUCH ULTRA TEST test strip     . ONETOUCH DELICA LANCETS 65K MISC     . VIAGRA 100 MG tablet  Take 1 tablet (100 mg total) by mouth daily as needed for erectile dysfunction. 10 tablet 3   No current facility-administered medications for this visit.     No Known Allergies  Social History   Social History  . Marital status: Married    Spouse name: N/A  . Number of children: N/A  . Years of education: N/A   Occupational History  . [employer], Materials management    Social History Main Topics  . Smoking status: Former Smoker    Packs/day: 0.50    Years: 45.00    Types: Cigarettes    Quit date: 04/30/2014  . Smokeless tobacco: Never Used     Comment: 02/25/2015 "stopped smoking 02/14/2015"  . Alcohol use 0.0 oz/week     Comment: 02/25/2015 last drink was summer 2016; I've pretty much quit"  . Drug use: Yes     Comment: "might have used some drugs in my 20's"  . Sexual activity: Yes   Other Topics Concern  . Not on file   Social History Narrative  . No narrative on file     Review of Systems: General: negative for chills, fever, night sweats or weight changes.  Cardiovascular: negative for chest pain, dyspnea on exertion, edema, orthopnea, palpitations, paroxysmal nocturnal dyspnea or shortness of breath Dermatological: negative for rash Respiratory: negative for cough or wheezing Urologic: negative for hematuria Abdominal: negative for nausea, vomiting, diarrhea, bright red blood per rectum, melena, or hematemesis Neurologic: negative for visual changes, syncope, or dizziness All other systems reviewed and are otherwise negative except as noted above.    Blood pressure (!) 147/76, pulse 63, height 6' 1"  (1.854 m), weight 188 lb 3.2 oz (85.4 kg), SpO2 99 %.  General appearance: alert and no distress Neck: no adenopathy, no carotid bruit, no JVD, supple, symmetrical, trachea midline and thyroid not enlarged, symmetric, no tenderness/mass/nodules Lungs: clear to auscultation bilaterally Heart: regular rate and rhythm, S1, S2 normal, no murmur, click, rub or  gallop Extremities: extremities normal, atraumatic, no cyanosis or edema  EKG not performed today  ASSESSMENT AND PLAN:   Essential hypertension History of hypertension blood pressure 147/76. He is on diltiazem and lisinopril. Continue current meds are current dose  Lt ICA stenosis- Carotid stent placed 07/17/14 History of left internal carotid artery stenting by myself 07/17/14 with recent Dopplers performed 01/22/16 that revealed a widely patent stent site with mild right ICA stenosis.  PAD (peripheral artery disease) (HCC) History of PAD status post bilateral Hawk 1 directional  atherectomy followed by drug-eluting balloon angioplasty of both SFAs with bilateral restenosis. He underwent staged right and left SFA filter stenting by myself in December of last year in January of this year. His claudication has resolved. His follow-up Doppler studies performed 05/06/16 revealed widely patent stents with normal ABIs.  Hyperlipidemia with target LDL less than 70 History of hyperlipidemia on statin therapy. We will recheck a lipid and liver profile.  Tobacco abuse History of discontinued tobacco abuse 15 months ago.      Anthony Harp MD FACP,FACC,FAHA, [facility] 05/20/2016 11:25 AM

## 2016-05-20 NOTE — Assessment & Plan Note (Signed)
History of discontinued tobacco abuse 15 months ago.

## 2016-05-20 NOTE — Assessment & Plan Note (Signed)
History of PAD status post bilateral Hawk 1 directional  atherectomy followed by drug-eluting balloon angioplasty of both SFAs with bilateral restenosis. He underwent staged right and left SFA filter stenting by myself in December of last year in January of this year. His claudication has resolved. His follow-up Doppler studies performed 05/06/16 revealed widely patent stents with normal ABIs.

## 2016-05-20 NOTE — Assessment & Plan Note (Signed)
History of left internal carotid artery stenting by myself 07/17/14 with recent Dopplers performed 01/22/16 that revealed a widely patent stent site with mild right ICA stenosis.

## 2016-05-20 NOTE — Assessment & Plan Note (Signed)
History of hypertension blood pressure 147/76. He is on diltiazem and lisinopril. Continue current meds are current dose

## 2016-05-20 NOTE — Assessment & Plan Note (Signed)
History of hyperlipidemia on statin therapy. We will recheck a lipid and liver profile 

## 2016-05-26 LAB — LIPID PANEL
Cholesterol: 121 mg/dL (ref <200)
HDL: 42 mg/dL (ref >40)
LDL Cholesterol: 50 mg/dL (ref <100)
TRIGLYCERIDES: 147 mg/dL (ref <150)
Total CHOL/HDL Ratio: 2.9 Ratio (ref <5.0)
VLDL: 29 mg/dL (ref <30)

## 2016-05-26 LAB — HEPATIC FUNCTION PANEL
ALBUMIN: 4.4 g/dL (ref 3.6–5.1)
ALK PHOS: 59 U/L (ref 40–115)
ALT: 11 U/L (ref 9–46)
AST: 16 U/L (ref 10–35)
BILIRUBIN INDIRECT: 0.3 mg/dL (ref 0.2–1.2)
BILIRUBIN TOTAL: 0.4 mg/dL (ref 0.2–1.2)
Bilirubin, Direct: 0.1 mg/dL (ref <=0.2)
TOTAL PROTEIN: 7.2 g/dL (ref 6.1–8.1)

## 2016-06-04 ENCOUNTER — Emergency Department (HOSPITAL_COMMUNITY): Payer: BLUE CROSS/BLUE SHIELD

## 2016-06-04 ENCOUNTER — Emergency Department (HOSPITAL_COMMUNITY)
Admission: EM | Admit: 2016-06-04 | Discharge: 2016-06-04 | Disposition: A | Discharge status: Home / Self Care | Payer: BLUE CROSS/BLUE SHIELD | Attending: Emergency Medicine | Admitting: Emergency Medicine

## 2016-06-04 ENCOUNTER — Encounter (HOSPITAL_COMMUNITY): Payer: Self-pay

## 2016-06-04 DIAGNOSIS — Z87891 Personal history of nicotine dependence: Secondary | ICD-10-CM | POA: Diagnosis not present

## 2016-06-04 DIAGNOSIS — E119 Type 2 diabetes mellitus without complications: Secondary | ICD-10-CM | POA: Diagnosis not present

## 2016-06-04 DIAGNOSIS — R200 Anesthesia of skin: Secondary | ICD-10-CM | POA: Diagnosis not present

## 2016-06-04 DIAGNOSIS — Z79899 Other long term (current) drug therapy: Secondary | ICD-10-CM | POA: Insufficient documentation

## 2016-06-04 DIAGNOSIS — Z7902 Long term (current) use of antithrombotics/antiplatelets: Secondary | ICD-10-CM | POA: Diagnosis not present

## 2016-06-04 DIAGNOSIS — Z7982 Long term (current) use of aspirin: Secondary | ICD-10-CM | POA: Insufficient documentation

## 2016-06-04 DIAGNOSIS — R202 Paresthesia of skin: Secondary | ICD-10-CM

## 2016-06-04 DIAGNOSIS — I10 Essential (primary) hypertension: Secondary | ICD-10-CM | POA: Diagnosis not present

## 2016-06-04 NOTE — ED Triage Notes (Signed)
Pt reports sudden onset of left arm numbness and tingling to fingers onset today at 1430 while at the movie theater. Pt speaking in clear complete sentences, strong equal hand grasps bilaterally and no arm drift. Dr. Patria Maneampos called to triage to assess patient for code stroke evaluation. Numbness has not completely resolved.

## 2016-06-04 NOTE — ED Notes (Signed)
NO CODE STROKE ACTIVATION AT THIS TIME PER DR. CAMPOS.

## 2016-06-04 NOTE — ED Provider Notes (Signed)
[facility] DEPT Provider Note   CSN: [id number] Arrival date & time: 06/04/16  1733     History   Chief Complaint Chief Complaint  Patient presents with  . Numbness    HPI Anthony Brock is a 61 y.o. male.  HPI Patient presents to the emergency department complaining of mild left arm numbness and tingling specifically in the left total finger and ring finger.  This began after watching a movie at the movie theater.  Initially he began to think that his left hand and arm and gone to sleep but when his symptoms persisted he said he comes the ER for evaluation.  No prior history of stroke.  He does have a history of hypertension, hyperlipidemia, diabetes.  He does have a history of carotid artery disease status post stent to his left internal carotid artery.  He also has a history of peripheral arterial disease.  Symptoms feel like they're slightly improving at this time but continues to be persistent.  He feels like having difficulty using his left hand does have difficulty opening his buttons with his left hand.   Past Medical History:  Diagnosis Date  . Abnormal EKG   . Arthritis    "left ankle" (02/14/2015)  . Carotid arterial disease ([place])    a. 56% LICA by PV angio 11/1273.  . Diabetes mellitus without complication ([place])   . Essential hypertension   . GERD (gastroesophageal reflux disease)   . High cholesterol   . Hyperglycemia   . PAD (peripheral artery disease) (HCC)    a. s/p PTA to L SFA with drug-eluting balloon angioplasty 04/30/14. b. balloon angiography of R SFA PTA 05/24/2014. c. Atherectomy w/ PTA R-SFA 02/14/2015; d. Atherectomy w/ drug-eluting PTA L-SFA 02/25/2015  . Presence of stent in L Internal Carotid 07/18/2014    7 mm/9 mm x 30 mm Xact Nitinol Self-Expanding Stent     Patient Active Problem List   Diagnosis Date Noted  . PVD (peripheral vascular disease) ([place]) 04/20/2016  . Diabetes mellitus type 2 in nonobese ([place]) 07/18/2015  . Abnormal EKG 05/15/2015   . Presence of stent in L Internal Carotid 07/18/2014    Class: Status post  . Hyperlipidemia with target LDL less than 70 07/18/2014  . Carotid artery disease ([place]) 07/17/2014  . Essential hypertension   . Hyperglycemia   . Lt ICA stenosis- Carotid stent placed 07/17/14   . Tobacco abuse   . PAD (peripheral artery disease) ([place])   . Claudication ([place]) 04/19/2014    Past Surgical History:  Procedure Laterality Date  . CAROTID ANGIOGRAM N/A 04/30/2014   Procedure: CAROTID ANGIOGRAM;  Surgeon: Lorretta Harp, MD;  Location: [facility] CATH LAB;  Service: Cardiovascular;  Laterality: N/A;  . CAROTID STENT INSERTION N/A 07/17/2014   Procedure: CAROTID STENT INSERTION;  Surgeon: Lorretta Harp, MD;  Location: [facility] CATH LAB;   7 mm/9 mm x 30 mm Xact Nitinol Self-Expanding Stent  . INGUINAL HERNIA REPAIR Right 2004  . LOWER EXTREMITY ANGIOGRAM N/A 04/30/2014   Procedure: LOWER EXTREMITY ANGIOGRAM;  Surgeon: Lorretta Harp, MD;  Location: [facility] CATH LAB;  Service: Cardiovascular;  Laterality: N/A;  . LOWER EXTREMITY ANGIOGRAM Right 02/14/2015  . PERIPHERAL VASCULAR CATHETERIZATION Left 04/30/2014   Procedure: PERIPHERAL VASCULAR ATHERECTOMY;  Surgeon: Lorretta Harp, MD;  Location: [facility] CATH LAB;  Service: Cardiovascular;  Laterality: Left;  lt SFA  . PERIPHERAL VASCULAR CATHETERIZATION Bilateral 02/14/2015   Procedure: Lower Extremity Angiography;  Surgeon: Lorretta Harp, MD;  Location: [facility] CV LAB;  Service: Cardiovascular;  Laterality: Bilateral;  . PERIPHERAL VASCULAR CATHETERIZATION Right 02/14/2015   Procedure: Peripheral Vascular Atherectomy;  Surgeon: Lorretta Harp, MD;  Location: [facility] CV LAB;  Service: Cardiovascular;  Laterality: Right;  SFA PTA DRUG COATED  . PERIPHERAL VASCULAR CATHETERIZATION Left 02/25/2015   Procedure: Peripheral Vascular Atherectomy;  Surgeon: Lorretta Harp, MD;  Location: [facility] CV LAB;  Service: Cardiovascular;  Laterality: Left;  PTA  . PERIPHERAL  VASCULAR CATHETERIZATION Bilateral 03/23/2016   Procedure: Lower Extremity Intervention;  Surgeon: Lorretta Harp, MD;  Location: [facility] CV LAB;  Service: Cardiovascular;  Laterality: Bilateral;  . PERIPHERAL VASCULAR CATHETERIZATION Left 03/23/2016   Procedure: Peripheral Vascular Intervention;  Surgeon: Lorretta Harp, MD;  Location: [facility] CV LAB;  Service: Cardiovascular;  Laterality: Left;  . PERIPHERAL VASCULAR CATHETERIZATION Right 04/20/2016   Procedure: Peripheral Vascular Intervention;  Surgeon: Lorretta Harp, MD;  Location: [facility] CV LAB;  Service: Cardiovascular;  Laterality: Right;  SFA  . TRANSLUMINAL ATHERECTOMY FEMORAL ARTERY Right 05/24/2014   SFA/notes 05/24/2014       Home Medications    Prior to Admission medications   Medication Sig Start Date End Date Taking? Authorizing Provider  aspirin EC 81 MG tablet Take 1 tablet (81 mg total) by mouth daily. 07/18/14   Erlene Quan, PA-C  atorvastatin (LIPITOR) 40 MG tablet TAKE 1 TABLET BY MOUTH EVERY EVENING 05/22/15   Lorretta Harp, MD  Blood Glucose Monitoring Suppl (ONE TOUCH ULTRA MINI) w/Device KIT  07/17/15   Historical Provider, MD  cetirizine (ZYRTEC) 10 MG tablet Take 10 mg by mouth daily as needed for allergies.     Historical Provider, MD  clopidogrel (PLAVIX) 75 MG tablet TAKE 1 TABLET BY MOUTH DAILY 04/02/16   Lorretta Harp, MD  diltiazem (CARDIZEM CD) 120 MG 24 hr capsule Take 1 capsule (120 mg total) by mouth daily. Patient taking differently: Take 120 mg by mouth every evening.  07/18/15   Rhonda G Barrett, PA-C  gabapentin (NEURONTIN) 300 MG capsule Take 300 mg by mouth every evening. 03/11/16   Historical Provider, MD  JANUMET 50-500 MG tablet Take 1 tablet by mouth daily before breakfast.  03/11/16   Historical Provider, MD  lisinopril (PRINIVIL,ZESTRIL) 5 MG tablet TAKE ONE (1) TABLET BY MOUTH EVERY DAY Patient taking differently: Take 79ms twice daily 02/17/16   JLorretta Harp MD  LUZU  1 % CREA Apply 1 application topically daily as needed (athletes foot).  04/02/14   Historical Provider, MD  ONE TOUCH ULTRA TEST test strip  07/17/15   Historical Provider, MD  OJonetta SpeakLANCETS [id number] 07/17/15   Historical Provider, MD  VIAGRA 100 MG tablet Take 1 tablet (100 mg total) by mouth daily as needed for erectile dysfunction. 03/04/16   JLorretta Harp MD    Family History Family History  Problem Relation Age of Onset  . Alzheimer's disease Mother   . Emphysema Father   . Alzheimer's disease Sister     Social History Social History  Substance Use Topics  . Smoking status: Former Smoker    Packs/day: 0.50    Years: 45.00    Types: Cigarettes    Quit date: 04/30/2014  . Smokeless tobacco: Never Used     Comment: 02/25/2015 "stopped smoking 02/14/2015"  . Alcohol use 0.0 oz/week     Comment: 02/25/2015 last drink was summer 2016; I've pretty much quit"  Allergies   Patient has no known allergies.   Review of Systems Review of Systems  All other systems reviewed and are negative.    Physical Exam Updated Vital Signs BP 174/78 (BP Location: Right Arm)   Pulse 63   Resp 16   SpO2 100%   Physical Exam  Constitutional: He is oriented to person, place, and time. He appears well-developed and well-nourished.  HENT:  Head: Normocephalic and atraumatic.  Eyes: EOM are normal. Pupils are equal, round, and reactive to light.  Neck: Normal range of motion.  Cardiovascular: Normal rate, regular rhythm and normal heart sounds.   Pulmonary/Chest: Effort normal and breath sounds normal. No respiratory distress.  Abdominal: Soft. He exhibits no distension. There is no tenderness.  Musculoskeletal: Normal range of motion.  Neurological: He is alert and oriented to person, place, and time.  5/5 strength in major muscle groups of  bilateral upper and lower extremities. Speech normal. No facial asymetry.   Skin: Skin is warm and dry.  Psychiatric: He has a  normal mood and affect. Judgment normal.  Nursing note and vitals reviewed.    ED Treatments / Results  Labs (all labs ordered are listed, but only abnormal results are displayed) Labs Reviewed - No data to display  EKG  EKG Interpretation None       Radiology Mr Brain Wo Contrast  Result Date: 06/04/2016 CLINICAL DATA:  Initial evaluation for acute onset left arm numbness. EXAM: MRI HEAD WITHOUT CONTRAST TECHNIQUE: Multiplanar, multiecho pulse sequences of the brain and surrounding structures were obtained without intravenous contrast. COMPARISON:  None available. FINDINGS: Brain: Cerebral volume within normal limits for patient age. No significant cerebral white matter disease. No abnormal foci of restricted diffusion to suggest acute or subacute ischemia. Gray-white matter differentiation maintained. No evidence for acute or chronic intracranial hemorrhage. No encephalomalacia to suggest chronic infarction. No mass lesion, midline shift or mass effect. No hydrocephalus. No extra-axial fluid collection. Major dural sinuses are patent. Pituitary gland and suprasellar region within normal limits. Vascular: Major intracranial vascular flow voids maintained. Skull and upper cervical spine: Craniocervical junction normal. Visualized upper cervical spine unremarkable. Bone marrow signal intensity within normal limits. No scalp soft tissue abnormality. Sinuses/Orbits: Globes and orbital soft tissues within normal limits. Small retention cyst noted within the left max O sinus. Minimal mucosal thickening within the ethmoidal air cells. Paranasal sinuses are otherwise clear. No mastoid effusion. Inner ear structures grossly normal. Other: No other significant finding. IMPRESSION: Normal brain MRI. No acute intracranial infarct or other process identified. Electronically Signed   By: Jeannine Boga M.D.   On: 06/04/2016 20:19    Procedures Procedures (including critical care time)  Medications  Ordered in ED Medications - No data to display   Initial Impression / Assessment and Plan / ED Course  I have reviewed the triage vital signs and the nursing notes.  Pertinent labs & imaging results that were available during my care of the patient were reviewed by me and considered in my medical decision making (see chart for details).     Suspected neuropathy from compression at the level of the ulnar nerve at the elbow.  However with his history of peripheral vascular disease and left carotid disease at that it was worthwhile to perform MRI to evaluate for an acute right brain stroke as he is at high risk for this.  MRI is negative.  Patient continues to feel well.  Outpatient primary care and neurology follow-up.  I suspect  he will continue to improve and I suspect this is a mononeuropathy from compression while watching the movie the movie theater.  Final Clinical Impressions(s) / ED Diagnoses   Final diagnoses:  Numbness and tingling in left hand    New Prescriptions New Prescriptions   No medications on file     Jola Schmidt, MD 06/04/16 2034

## 2016-06-04 NOTE — ED Notes (Signed)
Pt stable, ambulatory, states understanding of discharge instructions 

## 2016-06-08 ENCOUNTER — Ambulatory Visit (INDEPENDENT_AMBULATORY_CARE_PROVIDER_SITE_OTHER): Payer: BLUE CROSS/BLUE SHIELD | Admitting: Diagnostic Neuroimaging

## 2016-06-08 ENCOUNTER — Encounter: Payer: Self-pay | Admitting: Diagnostic Neuroimaging

## 2016-06-08 VITALS — BP 103/61 | HR 50 | Ht 73.0 in | Wt 189.0 lb

## 2016-06-08 DIAGNOSIS — G5622 Lesion of ulnar nerve, left upper limb: Secondary | ICD-10-CM | POA: Diagnosis not present

## 2016-06-08 NOTE — Patient Instructions (Signed)

## 2016-06-08 NOTE — Progress Notes (Signed)
[facility]  PATIENT: Anthony Brock DOB: Apr 26, 1955  REFERRING CLINICIAN: ER referral / Venora Maples HISTORY FROM: patient and wife  REASON FOR VISIT: new consult    HISTORICAL  CHIEF COMPLAINT:  Chief Complaint  Patient presents with  . Numbness, tingling    rm 6, New Pt, wife- Levada Dy, "numbness/tingling in L fingers and arm, lasted 2 days; 2-3 days prior pt had put pressure on L elbow for extended period of time holding a book"    HISTORY OF PRESENT ILLNESS:   61 year old male here for evaluation of left hand numbness and tingling.  06/04/16 patient was at the movie theater, 1 hour and 50 minutes at a movie, resting his arms on the arm rest, when he noticed numbness and tingling in his left hand, digits 4 and 5. He rubbed his elbow and shook his arm to try to improve symptoms. When he went home, he noticed some diffuse weakness in his left arm, mainly in his fingers. Patient went to the emergency room that night, was evaluated and diagnosed with possible left ulnar neuropathy.  Since that time symptoms have significantly improved. At onset he had lost about 50% function in his left hand. Now his hand is 95% back to normal.  In retrospect patient had been laying and leaning on his left elbow, especially in the evening, while reading in bed. No problems with his right arm, legs, face, speech or swallowing. No headaches, vision changes, nausea or vomiting. No chest pain or shortness of breath.  Patient does have history of diabetes, with a.m. blood sugar of 135 this morning. Blood sugar has been creeping up lately. Patient on oral diabetes medications currently. He does have some mild numbness and tingling in his toes related to diabetes.   REVIEW OF SYSTEMS: Full 14 system review of systems performed and negative with exception of: Numbness weakness. History of peripheral vascular disease status post stenting. History of right carotid artery stenting.     ALLERGIES: No Known Allergies  HOME MEDICATIONS: Outpatient Medications Prior to Visit  Medication Sig Dispense Refill  . aspirin EC 81 MG tablet Take 1 tablet (81 mg total) by mouth daily.    [name] atorvastatin (LIPITOR) 40 MG tablet TAKE 1 TABLET BY MOUTH EVERY EVENING 30 tablet 6  . Blood Glucose Monitoring Suppl (ONE TOUCH ULTRA MINI) w/Device KIT     . cetirizine (ZYRTEC) 10 MG tablet Take 10 mg by mouth daily as needed for allergies.     [name] clopidogrel (PLAVIX) 75 MG tablet TAKE 1 TABLET BY MOUTH DAILY 90 tablet 2  . diltiazem (CARDIZEM CD) 120 MG 24 hr capsule Take 1 capsule (120 mg total) by mouth daily. (Patient taking differently: Take 120 mg by mouth every evening. ) 90 capsule 3  . gabapentin (NEURONTIN) 300 MG capsule Take 300 mg by mouth every evening.    [name] JANUMET 50-500 MG tablet Take 1 tablet by mouth daily before breakfast.     . lisinopril (PRINIVIL,ZESTRIL) 5 MG tablet TAKE ONE (1) TABLET BY MOUTH EVERY DAY (Patient taking differently: Take 19ms twice daily) 30 tablet 0  . LUZU 1 % CREA Apply 1 application topically daily as needed (athletes foot).     . ONE TOUCH ULTRA TEST test strip     . ONETOUCH DELICA LANCETS 308MMISC     . VIAGRA 100 MG tablet Take 1 tablet (100 mg total) by mouth daily as needed for erectile dysfunction. 10 tablet 3   No facility-administered  medications prior to visit.     PAST MEDICAL HISTORY: Past Medical History:  Diagnosis Date  . Abnormal EKG   . Arthritis    "left ankle" (02/14/2015)  . Carotid arterial disease ([place])    a. 31% LICA by PV angio 08/4006.  . Diabetes mellitus without complication ([place])   . Essential hypertension   . GERD (gastroesophageal reflux disease)   . High cholesterol   . Hyperglycemia   . PAD (peripheral artery disease) (HCC)    a. s/p PTA to L SFA with drug-eluting balloon angioplasty 04/30/14. b. balloon angiography of R SFA PTA 05/24/2014. c. Atherectomy w/ PTA R-SFA 02/14/2015; d. Atherectomy w/  drug-eluting PTA L-SFA 02/25/2015  . Presence of stent in L Internal Carotid 07/18/2014    7 mm/9 mm x 30 mm Xact Nitinol Self-Expanding Stent     PAST SURGICAL HISTORY: Past Surgical History:  Procedure Laterality Date  . CAROTID ANGIOGRAM N/A 04/30/2014   Procedure: CAROTID ANGIOGRAM;  Surgeon: Lorretta Harp, MD;  Location: [facility] CATH LAB;  Service: Cardiovascular;  Laterality: N/A;  . CAROTID STENT INSERTION N/A 07/17/2014   Procedure: CAROTID STENT INSERTION;  Surgeon: Lorretta Harp, MD;  Location: [facility] CATH LAB;   7 mm/9 mm x 30 mm Xact Nitinol Self-Expanding Stent  . INGUINAL HERNIA REPAIR Right 2004  . LOWER EXTREMITY ANGIOGRAM N/A 04/30/2014   Procedure: LOWER EXTREMITY ANGIOGRAM;  Surgeon: Lorretta Harp, MD;  Location: [facility] CATH LAB;  Service: Cardiovascular;  Laterality: N/A;  . LOWER EXTREMITY ANGIOGRAM Right 02/14/2015  . PERIPHERAL VASCULAR CATHETERIZATION Left 04/30/2014   Procedure: PERIPHERAL VASCULAR ATHERECTOMY;  Surgeon: Lorretta Harp, MD;  Location: [facility] CATH LAB;  Service: Cardiovascular;  Laterality: Left;  lt SFA  . PERIPHERAL VASCULAR CATHETERIZATION Bilateral 02/14/2015   Procedure: Lower Extremity Angiography;  Surgeon: Lorretta Harp, MD;  Location: [facility] CV LAB;  Service: Cardiovascular;  Laterality: Bilateral;  . PERIPHERAL VASCULAR CATHETERIZATION Right 02/14/2015   Procedure: Peripheral Vascular Atherectomy;  Surgeon: Lorretta Harp, MD;  Location: [place] CV LAB;  Service: Cardiovascular;  Laterality: Right;  SFA PTA DRUG COATED  . PERIPHERAL VASCULAR CATHETERIZATION Left 02/25/2015   Procedure: Peripheral Vascular Atherectomy;  Surgeon: Lorretta Harp, MD;  Location: [place] [facility] CV LAB;  Service: Cardiovascular;  Laterality: Left;  PTA  . PERIPHERAL VASCULAR CATHETERIZATION Bilateral 03/23/2016   Procedure: Lower Extremity Intervention;  Surgeon: Lorretta Harp, MD;  Location: [facility] CV LAB;  Service: Cardiovascular;  Laterality: Bilateral;   . PERIPHERAL VASCULAR CATHETERIZATION Left 03/23/2016   Procedure: Peripheral Vascular Intervention;  Surgeon: Lorretta Harp, MD;  Location: [place] CV LAB;  Service: Cardiovascular;  Laterality: Left;  . PERIPHERAL VASCULAR CATHETERIZATION Right 04/20/2016   Procedure: Peripheral Vascular Intervention;  Surgeon: Lorretta Harp, MD;  Location: [facility] CV LAB;  Service: Cardiovascular;  Laterality: Right;  SFA  . TRANSLUMINAL ATHERECTOMY FEMORAL ARTERY Right 05/24/2014   SFA/notes 05/24/2014    FAMILY HISTORY: Family History  Problem Relation Age of Onset  . Alzheimer's disease Mother   . Dementia Mother   . Emphysema Father   . Alzheimer's disease Sister     SOCIAL HISTORY:  Social History   Social History  . Marital status: Married    Spouse name: Levada Dy  . Number of children: 2  . Years of education: 15   Occupational History  . [employer], Materials management    Social History Main Topics  . Smoking status: Former Smoker  Packs/day: 0.50    Years: 45.00    Types: Cigarettes    Quit date: 03/01/2015  . Smokeless tobacco: Never Used     Comment: 02/25/2015 "stopped smoking 02/14/2015"  . Alcohol use 0.0 oz/week     Comment: 02/25/2015 last drink was summer 2016; I've pretty much quit"  . Drug use: Yes     Comment: "might have used some drugs in my 20's"  . Sexual activity: Yes   Other Topics Concern  . Not on file   Social History Narrative   Lives with wife   caffeine- coffee, 1-2 cups daily     PHYSICAL EXAM  GENERAL EXAM/CONSTITUTIONAL: Vitals:  Vitals:   06/08/16 1008  BP: 103/61  Pulse: (!) 50  Weight: 189 lb (85.7 kg)  Height: 6' 1" (1.854 m)     Body mass index is 24.94 kg/m.  Visual Acuity Screening   Right eye Left eye Both eyes  Without correction:     With correction: 20/40 20/40      Patient is in no distress; well developed, nourished and groomed; neck is supple  CARDIOVASCULAR:  Examination of carotid arteries  is normal; no carotid bruits  Regular rate and rhythm, no murmurs  Examination of peripheral vascular system by observation and palpation is normal  EYES:  Ophthalmoscopic exam of optic discs and posterior segments is normal; no papilledema or hemorrhages  MUSCULOSKELETAL:  Gait, strength, tone, movements noted in Neurologic exam below  NEUROLOGIC: MENTAL STATUS:  No flowsheet data found.  awake, alert, oriented to person, place and time  recent and remote memory intact  normal attention and concentration  language fluent, comprehension intact, naming intact,   fund of knowledge appropriate  CRANIAL NERVE:   2nd - no papilledema on fundoscopic exam  2nd, 3rd, 4th, 6th - pupils equal and reactive to light, visual fields full to confrontation, extraocular muscles intact, no nystagmus  5th - facial sensation symmetric  7th - facial strength symmetric  8th - hearing intact  9th - palate elevates symmetrically, uvula midline  11th - shoulder shrug symmetric  12th - tongue protrusion midline  MOTOR:   normal bulk and tone, full strength in the BUE, BLE  EXCEPT DECR LEFT HAND FINGER FLEXION (DIGITS 4, 5) AND LEFT HAND FINGER ABDUCTION  SENSORY:   normal and symmetric to light touch, pinprick, temperature, vibration EXCEPT:  DECR SENS IN LEFT HAND DIGIT 5 FINGER TIP  NEGATIVE TINEL'S AT LEFT ELBOW  COORDINATION:   finger-nose-finger, fine finger movements normal  REFLEXES:   deep tendon reflexes present and symmetric  GAIT/STATION:   narrow based gait; able to walk tandem; romberg is negative    DIAGNOSTIC DATA (LABS, IMAGING, TESTING) - I reviewed patient records, labs, notes, testing and imaging myself where available.  Lab Results  Component Value Date   WBC 6.2 04/10/2016   HGB 12.3 (L) 04/10/2016   HCT 36.3 (L) 04/10/2016   MCV 84.6 04/10/2016   PLT 285 04/10/2016      Component Value Date/Time   NA 138 04/10/2016 1311   K 4.3  04/10/2016 1311   CL 103 04/10/2016 1311   CO2 26 04/10/2016 1311   GLUCOSE 111 (H) 04/10/2016 1311   BUN 12 04/10/2016 1311   CREATININE 1.18 04/10/2016 1311   CALCIUM 9.2 04/10/2016 1311   PROT 7.2 05/25/2016 1052   ALBUMIN 4.4 05/25/2016 1052   AST 16 05/25/2016 1052   ALT 11 05/25/2016 1052   ALKPHOS 59 05/25/2016 1052  BILITOT 0.4 05/25/2016 1052   GFRNONAA 66 04/10/2016 1311   GFRAA 77 04/10/2016 1311   Lab Results  Component Value Date   CHOL 121 05/25/2016   HDL 42 05/25/2016   LDLCALC 50 05/25/2016   TRIG 147 05/25/2016   CHOLHDL 2.9 05/25/2016   No results found for: HGBA1C No results found for: VITAMINB12 Lab Results  Component Value Date   TSH 1.89 03/11/2016    01/22/16 carotid u/s - Heterogenous plaque in the right system - S/p LICA stent. - 40-59% RICA stenosis. - 4-13% LICA stenosis. - Normal subclavian arteries, bilaterally. - Patent vertebral arteries with antegrade flow.  06/04/16 MRI brain [I reviewed images myself and agree with interpretation. -VRP]  - Normal brain MRI. No acute intracranial infarct or other process identified.     ASSESSMENT AND PLAN  61 y.o. year old male here with new onset (~06/04/16) of numbness, tingling, weakness in left hand digits 4 and 5, most consistent with left ulnar neuropathy. Symptoms are spontaneously improving. Most likely represent compression neuropathy at the left elbow. Advised patient to avoid compression of the left elbow, continue gentle strengthening and stretching exercises for the left hand and monitor over next 4-6 weeks.   Dx:  1. Ulnar neuropathy at elbow of left upper extremity     PLAN: - conservative mgmt - monitor for nerve healing over next 4-6 weeks - encouraged gentle exercises for left hand / nerve recovery   Return if symptoms worsen or fail to improve, for return to PCP.    Penni Bombard, MD 2/44/0102, 72:53 AM Certified in Neurology, Neurophysiology and  Neuroimaging  [facility] [address], [place], [place] [address] [phone]

## 2016-07-02 NOTE — Patient Instructions (Addendum)
Anthony Brock  07/02/2016   Your procedure is scheduled on: 07/09/16  [facility]  Entrance [place]  elevators to 3rd floor to  Short Stay Center at    0530 AM.  Call this number if you have problems the morning of surgery [phone]   Remember: ONLY 1 PERSON MAY GO WITH YOU TO SHORT STAY TO GET  READY MORNING OF YOUR SURGERY.  Do not eat food or drink liquids :After Midnight.     Take these medicines the morning of surgery with A SIP OF WATER: NONE DO NOT TAKE ANY DIABETIC MEDICATIONS DAY OF YOUR SURGERY                               You may not have any metal on your body including hair pins and              piercings  Do not wear jewelry, make-up, lotions, powders or perfumes, deodorant                           Men may shave face and neck.   Do not bring valuables to the hospital. [place] IS NOT             RESPONSIBLE   FOR VALUABLES.  Contacts, dentures or bridgework may not be worn into surgery.      Patients discharged the day of surgery will not be allowed to drive home.  Name and phone number of your driver:  Special Instructions: N/A              Please read over the following fact sheets you were given: _____________________________________________________________________             [facility] - Preparing for Surgery Before surgery, you can play an important role.  Because skin is not sterile, your skin needs to be as free of germs as possible.  You can reduce the number of germs on your skin by washing with CHG (chlorahexidine gluconate) soap before surgery.  CHG is an antiseptic cleaner which kills germs and bonds with the skin to continue killing germs even after washing. Please DO NOT use if you have an allergy to CHG or antibacterial soaps.  If your skin becomes reddened/irritated stop using the CHG and inform your nurse when you arrive at Short Stay. Do not shave (including legs and underarms) for at least 48  hours prior to the first CHG shower.  You may shave your face/neck. Please follow these instructions carefully:  1.  Shower with CHG Soap the night before surgery and the  morning of Surgery.  2.  If you choose to wash your hair, wash your hair first as usual with your  normal  shampoo.  3.  After you shampoo, rinse your hair and body thoroughly to remove the  shampoo.                           4.  Use CHG as you would any other liquid soap.  You can apply chg directly  to the skin and wash                       Gently with a scrungie or  clean washcloth.  5.  Apply the CHG Soap to your body ONLY FROM THE NECK DOWN.   Do not use on face/ open                           Wound or open sores. Avoid contact with eyes, ears mouth and genitals (private parts).                       Wash face,  Genitals (private parts) with your normal soap.             6.  Wash thoroughly, paying special attention to the area where your surgery  will be performed.  7.  Thoroughly rinse your body with warm water from the neck down.  8.  DO NOT shower/wash with your normal soap after using and rinsing off  the CHG Soap.                9.  Pat yourself dry with a clean towel.            10.  Wear clean pajamas.            11.  Place clean sheets on your bed the night of your first shower and do not  sleep with pets. Day of Surgery : Do not apply any lotions/deodorants the morning of surgery.  Please wear clean clothes to the hospital/surgery center.  FAILURE TO FOLLOW THESE INSTRUCTIONS MAY RESULT IN THE CANCELLATION OF YOUR SURGERY PATIENT SIGNATURE_________________________________  NURSE SIGNATURE__________________________________  ________________________________________________________________________

## 2016-07-06 ENCOUNTER — Encounter (HOSPITAL_COMMUNITY): Payer: Self-pay

## 2016-07-06 ENCOUNTER — Encounter (HOSPITAL_COMMUNITY)
Admission: RE | Admit: 2016-07-06 | Discharge: 2016-07-06 | Disposition: A | Discharge status: Home / Self Care | Payer: BLUE CROSS/BLUE SHIELD | Source: Ambulatory Visit | Attending: Surgery | Admitting: Surgery

## 2016-07-06 DIAGNOSIS — Z01812 Encounter for preprocedural laboratory examination: Secondary | ICD-10-CM | POA: Insufficient documentation

## 2016-07-06 LAB — CBC
HEMATOCRIT: 36.1 % — AB (ref 39.0–52.0)
Hemoglobin: 12.7 g/dL — ABNORMAL LOW (ref 13.0–17.0)
MCH: 28.9 pg (ref 26.0–34.0)
MCHC: 35.2 g/dL (ref 30.0–36.0)
MCV: 82.2 fL (ref 78.0–100.0)
Platelets: 264 10*3/uL (ref 150–400)
RBC: 4.39 MIL/uL (ref 4.22–5.81)
RDW: 12.5 % (ref 11.5–15.5)
WBC: 5.1 10*3/uL (ref 4.0–10.5)

## 2016-07-06 LAB — BASIC METABOLIC PANEL
Anion gap: 6 (ref 5–15)
BUN: 16 mg/dL (ref 6–20)
CO2: 25 mmol/L (ref 22–32)
Calcium: 9.3 mg/dL (ref 8.9–10.3)
Chloride: 105 mmol/L (ref 101–111)
Creatinine, Ser: 1.24 mg/dL (ref 0.61–1.24)
GFR calc Af Amer: >60 mL/min (ref >60)
GFR calc non Af Amer: >60 mL/min (ref >60)
Glucose, Bld: 137 mg/dL — ABNORMAL HIGH (ref 65–99)
Potassium: 4.1 mmol/L (ref 3.5–5.1)
Sodium: 136 mmol/L (ref 135–145)

## 2016-07-06 LAB — GLUCOSE, CAPILLARY: Glucose-Capillary: 158 mg/dL — ABNORMAL HIGH (ref 65–99)

## 2016-07-06 NOTE — Progress Notes (Signed)
EKG 03/11/16  05/24/15 echo Stress 04/12/15  LOV 05/20/16 Dr. Allyson SabalBerry  03/13/16 cxr All in epic

## 2016-07-06 NOTE — Progress Notes (Signed)
Pt. Stated at preop appt. 3/26 @ 1100 am that he couldn't remember which med hed was suppose to stop before surgery. In Dr. Allyson SabalBerry OV note in epic he staed pt. Was to stop taking Plavix 7 days prior to surgery. Notified Nettie ElmSylvia triage nurse at [facility]. That patient stated hed had taken his Plavix this am 07/06/16.

## 2016-07-07 LAB — HEMOGLOBIN A1C
Hgb A1c MFr Bld: 6.2 % — ABNORMAL HIGH (ref 4.8–5.6)
Mean Plasma Glucose: 131 mg/dL

## 2016-07-07 NOTE — Progress Notes (Signed)
Confirmed with Mr. Anthony Brock that Dr. Michaell CowingGross office had spoke with him about surgery date change He  Stated he had spoken with them

## 2016-07-08 NOTE — Progress Notes (Signed)
Spoke with patient and instructed him with new date of surgery to be on Tuesday 07/28/2016 and to [facility] at [facility] third floor at 0745 am. Nothing to drink and eat after midnight.! He verbalized when he was  to stop his Plavix and will take the medications he had on his instruction sheet from the Pre-op appointment he had on 07/06/2016. Patient verbalized understanding.

## 2016-07-12 HISTORY — PX: INGUINAL HERNIA REPAIR: SUR1180

## 2016-07-17 DIAGNOSIS — B029 Zoster without complications: Secondary | ICD-10-CM

## 2016-07-17 HISTORY — DX: Zoster without complications: B02.9

## 2016-07-28 ENCOUNTER — Ambulatory Visit (HOSPITAL_COMMUNITY): Payer: BLUE CROSS/BLUE SHIELD | Admitting: Certified Registered Nurse Anesthetist

## 2016-07-28 ENCOUNTER — Ambulatory Visit (HOSPITAL_COMMUNITY)
Admission: RE | Admit: 2016-07-28 | Discharge: 2016-07-28 | Disposition: A | Discharge status: Home / Self Care | Payer: BLUE CROSS/BLUE SHIELD | Source: Ambulatory Visit | Attending: Surgery | Admitting: Surgery

## 2016-07-28 ENCOUNTER — Encounter (HOSPITAL_COMMUNITY)
Admission: RE | Disposition: A | Discharge status: Home / Self Care | Payer: Self-pay | Source: Ambulatory Visit | Attending: Surgery

## 2016-07-28 ENCOUNTER — Encounter (HOSPITAL_COMMUNITY): Payer: Self-pay

## 2016-07-28 DIAGNOSIS — Z7984 Long term (current) use of oral hypoglycemic drugs: Secondary | ICD-10-CM | POA: Insufficient documentation

## 2016-07-28 DIAGNOSIS — Z79899 Other long term (current) drug therapy: Secondary | ICD-10-CM | POA: Insufficient documentation

## 2016-07-28 DIAGNOSIS — Z87891 Personal history of nicotine dependence: Secondary | ICD-10-CM | POA: Diagnosis not present

## 2016-07-28 DIAGNOSIS — Z7902 Long term (current) use of antithrombotics/antiplatelets: Secondary | ICD-10-CM | POA: Diagnosis not present

## 2016-07-28 DIAGNOSIS — K4090 Unilateral inguinal hernia, without obstruction or gangrene, not specified as recurrent: Secondary | ICD-10-CM | POA: Insufficient documentation

## 2016-07-28 DIAGNOSIS — E669 Obesity, unspecified: Secondary | ICD-10-CM | POA: Insufficient documentation

## 2016-07-28 DIAGNOSIS — Z6825 Body mass index (BMI) 25.0-25.9, adult: Secondary | ICD-10-CM | POA: Insufficient documentation

## 2016-07-28 DIAGNOSIS — E1140 Type 2 diabetes mellitus with diabetic neuropathy, unspecified: Secondary | ICD-10-CM | POA: Diagnosis not present

## 2016-07-28 DIAGNOSIS — Z95820 Peripheral vascular angioplasty status with implants and grafts: Secondary | ICD-10-CM | POA: Insufficient documentation

## 2016-07-28 DIAGNOSIS — E785 Hyperlipidemia, unspecified: Secondary | ICD-10-CM | POA: Insufficient documentation

## 2016-07-28 HISTORY — PX: INSERTION OF MESH: SHX5868

## 2016-07-28 HISTORY — DX: Zoster without complications: B02.9

## 2016-07-28 HISTORY — PX: INGUINAL HERNIA REPAIR: SHX194

## 2016-07-28 LAB — GLUCOSE, CAPILLARY
GLUCOSE-CAPILLARY: 108 mg/dL — AB (ref 65–99)
GLUCOSE-CAPILLARY: 104 mg/dL — AB (ref 65–99)

## 2016-07-28 SURGERY — REPAIR, HERNIA, INGUINAL, BILATERAL, LAPAROSCOPIC
Anesthesia: General | Site: Inguinal | Laterality: Left

## 2016-07-28 MED ORDER — LACTATED RINGERS IR SOLN
Status: DC | PRN
  Administered 2016-07-28: 1000 mL

## 2016-07-28 MED ORDER — ROCURONIUM BROMIDE 50 MG/5ML IV SOSY
PREFILLED_SYRINGE | INTRAVENOUS | Status: DC | PRN
  Administered 2016-07-28: 10 mg via INTRAVENOUS
  Administered 2016-07-28: 50 mg via INTRAVENOUS

## 2016-07-28 MED ORDER — EPHEDRINE SULFATE-NACL 50-0.9 MG/10ML-% IV SOSY
PREFILLED_SYRINGE | INTRAVENOUS | Status: DC | PRN
  Administered 2016-07-28: 5 mg via INTRAVENOUS

## 2016-07-28 MED ORDER — CHLORHEXIDINE GLUCONATE CLOTH 2 % EX PADS: 6.0000 | MEDICATED_PAD | Freq: Once | CUTANEOUS | Status: DC

## 2016-07-28 MED ORDER — ONDANSETRON HCL 4 MG/2ML IJ SOLN
INTRAMUSCULAR | Status: DC | PRN
  Administered 2016-07-28: 4 mg via INTRAVENOUS

## 2016-07-28 MED ORDER — PROPOFOL 10 MG/ML IV BOLUS
INTRAVENOUS | Status: AC
  Filled 2016-07-28: qty 20

## 2016-07-28 MED ORDER — FENTANYL CITRATE (PF) 100 MCG/2ML IJ SOLN
INTRAMUSCULAR | Status: DC | PRN
Start: 2016-07-28 — End: 2016-07-28
  Administered 2016-07-28: 100 ug via INTRAVENOUS
  Administered 2016-07-28 (×2): 50 ug via INTRAVENOUS

## 2016-07-28 MED ORDER — TRAMADOL HCL 50 MG PO TABS: 50.0000 mg | ORAL_TABLET | Freq: Four times a day (QID) | ORAL | 0 refills | Status: DC | PRN

## 2016-07-28 MED ORDER — METOCLOPRAMIDE HCL 5 MG/ML IJ SOLN
INTRAMUSCULAR | Status: DC | PRN
  Administered 2016-07-28: 10 mg via INTRAVENOUS

## 2016-07-28 MED ORDER — STERILE WATER FOR IRRIGATION IR SOLN
Status: DC | PRN
  Administered 2016-07-28: 1000 mL

## 2016-07-28 MED ORDER — SUGAMMADEX SODIUM 200 MG/2ML IV SOLN
INTRAVENOUS | Status: DC | PRN
  Administered 2016-07-28: 200 mg via INTRAVENOUS

## 2016-07-28 MED ORDER — ACETAMINOPHEN 500 MG PO TABS
1000.0000 mg | ORAL_TABLET | ORAL | Status: AC
  Administered 2016-07-28: 1000 mg via ORAL
  Filled 2016-07-28: qty 2

## 2016-07-28 MED ORDER — LIDOCAINE 2% (20 MG/ML) 5 ML SYRINGE
INTRAMUSCULAR | Status: AC
  Filled 2016-07-28: qty 5

## 2016-07-28 MED ORDER — SUCCINYLCHOLINE CHLORIDE 200 MG/10ML IV SOSY
PREFILLED_SYRINGE | INTRAVENOUS | Status: AC
  Filled 2016-07-28: qty 10

## 2016-07-28 MED ORDER — LACTATED RINGERS IV SOLN
INTRAVENOUS | Status: DC
  Administered 2016-07-28: 1000 mL via INTRAVENOUS
  Administered 2016-07-28: 10:00:00 via INTRAVENOUS

## 2016-07-28 MED ORDER — CELECOXIB 200 MG PO CAPS
400.0000 mg | ORAL_CAPSULE | ORAL | Status: AC
  Administered 2016-07-28: 400 mg via ORAL
  Filled 2016-07-28: qty 2

## 2016-07-28 MED ORDER — METOCLOPRAMIDE HCL 5 MG/ML IJ SOLN
INTRAMUSCULAR | Status: AC
  Filled 2016-07-28: qty 2

## 2016-07-28 MED ORDER — CEFAZOLIN SODIUM-DEXTROSE 2-4 GM/100ML-% IV SOLN
2.0000 g | INTRAVENOUS | Status: AC
  Administered 2016-07-28: 2 g via INTRAVENOUS
  Filled 2016-07-28: qty 100

## 2016-07-28 MED ORDER — EPHEDRINE 5 MG/ML INJ
INTRAVENOUS | Status: AC
  Filled 2016-07-28: qty 10

## 2016-07-28 MED ORDER — TRAMADOL HCL 50 MG PO TABS
50.0000 mg | ORAL_TABLET | Freq: Once | ORAL | Status: AC
  Administered 2016-07-28: 50 mg via ORAL
  Filled 2016-07-28: qty 1

## 2016-07-28 MED ORDER — METHOCARBAMOL 750 MG PO TABS
750.0000 mg | ORAL_TABLET | Freq: Four times a day (QID) | ORAL | 2 refills | Status: DC | PRN
Start: 2016-07-28 — End: 2017-01-12

## 2016-07-28 MED ORDER — 0.9 % SODIUM CHLORIDE (POUR BTL) OPTIME
TOPICAL | Status: DC | PRN
  Administered 2016-07-28: 1000 mL

## 2016-07-28 MED ORDER — MEPERIDINE HCL 50 MG/ML IJ SOLN
6.2500 mg | INTRAMUSCULAR | Status: DC | PRN
Start: 2016-07-28 — End: 2016-07-28

## 2016-07-28 MED ORDER — MIDAZOLAM HCL 5 MG/5ML IJ SOLN
INTRAMUSCULAR | Status: DC | PRN
Start: 2016-07-28 — End: 2016-07-28
  Administered 2016-07-28: 2 mg via INTRAVENOUS

## 2016-07-28 MED ORDER — ONDANSETRON HCL 4 MG/2ML IJ SOLN
INTRAMUSCULAR | Status: AC
  Filled 2016-07-28: qty 2

## 2016-07-28 MED ORDER — SUGAMMADEX SODIUM 200 MG/2ML IV SOLN
INTRAVENOUS | Status: AC
  Filled 2016-07-28: qty 2

## 2016-07-28 MED ORDER — LIDOCAINE 2% (20 MG/ML) 5 ML SYRINGE
INTRAMUSCULAR | Status: DC | PRN
  Administered 2016-07-28: 100 mg via INTRAVENOUS

## 2016-07-28 MED ORDER — GABAPENTIN 300 MG PO CAPS
300.0000 mg | ORAL_CAPSULE | ORAL | Status: AC
  Administered 2016-07-28: 300 mg via ORAL
  Filled 2016-07-28: qty 1

## 2016-07-28 MED ORDER — ROCURONIUM BROMIDE 50 MG/5ML IV SOSY
PREFILLED_SYRINGE | INTRAVENOUS | Status: AC
  Filled 2016-07-28: qty 5

## 2016-07-28 MED ORDER — BUPIVACAINE HCL (PF) 0.25 % IJ SOLN
INTRAMUSCULAR | Status: DC | PRN
  Administered 2016-07-28: 60 mL

## 2016-07-28 MED ORDER — PROPOFOL 10 MG/ML IV BOLUS
INTRAVENOUS | Status: DC | PRN
  Administered 2016-07-28: 150 mg via INTRAVENOUS

## 2016-07-28 MED ORDER — MIDAZOLAM HCL 2 MG/2ML IJ SOLN
INTRAMUSCULAR | Status: AC
  Filled 2016-07-28: qty 2

## 2016-07-28 MED ORDER — FENTANYL CITRATE (PF) 250 MCG/5ML IJ SOLN
INTRAMUSCULAR | Status: AC
  Filled 2016-07-28: qty 5

## 2016-07-28 MED ORDER — HYDROMORPHONE HCL 1 MG/ML IJ SOLN
0.2500 mg | INTRAMUSCULAR | Status: DC | PRN
  Filled 2016-07-28: qty 0.5

## 2016-07-28 MED ORDER — METOCLOPRAMIDE HCL 5 MG/ML IJ SOLN: 10.0000 mg | Freq: Once | INTRAMUSCULAR | Status: DC | PRN

## 2016-07-28 MED ORDER — ARTIFICIAL TEARS OP OINT
TOPICAL_OINTMENT | OPHTHALMIC | Status: AC
  Filled 2016-07-28: qty 3.5

## 2016-07-28 MED ORDER — BUPIVACAINE HCL (PF) 0.25 % IJ SOLN
INTRAMUSCULAR | Status: AC
  Filled 2016-07-28: qty 90

## 2016-07-28 SURGICAL SUPPLY — 32 items
CABLE HIGH FREQUENCY MONO STRZ (ELECTRODE) ×3 IMPLANT
CHLORAPREP W/TINT 26ML (MISCELLANEOUS) ×3 IMPLANT
COVER SURGICAL LIGHT HANDLE (MISCELLANEOUS) ×3 IMPLANT
DECANTER SPIKE VIAL GLASS SM (MISCELLANEOUS) ×3 IMPLANT
DRAPE WARM FLUID 44X44 (DRAPE) ×3 IMPLANT
DRSG TEGADERM 2-3/8X2-3/4 SM (GAUZE/BANDAGES/DRESSINGS) ×6 IMPLANT
DRSG TEGADERM 4X4.75 (GAUZE/BANDAGES/DRESSINGS) ×3 IMPLANT
ELECT REM PT RETURN 15FT ADLT (MISCELLANEOUS) ×3 IMPLANT
GAUZE SPONGE 2X2 8PLY STRL LF (GAUZE/BANDAGES/DRESSINGS) ×1 IMPLANT
GLOVE BIOGEL PI IND STRL 7.5 (GLOVE) ×3 IMPLANT
GLOVE BIOGEL PI INDICATOR 7.5 (GLOVE) ×6
GLOVE ECLIPSE 8.0 STRL XLNG CF (GLOVE) ×3 IMPLANT
GLOVE INDICATOR 8.0 STRL GRN (GLOVE) ×6 IMPLANT
GLOVE SURG SS PI 7.5 STRL IVOR (GLOVE) ×3 IMPLANT
GOWN STRL REUS W/TWL XL LVL3 (GOWN DISPOSABLE) ×9 IMPLANT
IRRIG SUCT STRYKERFLOW 2 WTIP (MISCELLANEOUS) ×3
IRRIGATION SUCT STRKRFLW 2 WTP (MISCELLANEOUS) ×1 IMPLANT
KIT BASIN OR (CUSTOM PROCEDURE TRAY) ×3 IMPLANT
MESH ULTRAPRO 6X6 15CM15CM (Mesh General) ×9 IMPLANT
NEEDLE INSUFFLATION 14GA 120MM (NEEDLE) ×3 IMPLANT
PAD POSITIONING PINK XL (MISCELLANEOUS) ×3 IMPLANT
SCISSORS LAP 5X35 DISP (ENDOMECHANICALS) ×3 IMPLANT
SLEEVE ADV FIXATION 5X100MM (TROCAR) ×3 IMPLANT
SPONGE GAUZE 2X2 STER 10/PKG (GAUZE/BANDAGES/DRESSINGS) ×2
SUT MNCRL AB 4-0 PS2 18 (SUTURE) ×3 IMPLANT
SUT VIC AB 2-0 SH 27 (SUTURE)
SUT VIC AB 2-0 SH 27X BRD (SUTURE) IMPLANT
TOWEL OR 17X26 10 PK STRL BLUE (TOWEL DISPOSABLE) ×3 IMPLANT
TRAY LAPAROSCOPIC (CUSTOM PROCEDURE TRAY) ×3 IMPLANT
TROCAR ADV FIXATION 5X100MM (TROCAR) ×3 IMPLANT
TROCAR XCEL BLUNT TIP 100MML (ENDOMECHANICALS) ×3 IMPLANT
TUBING INSUF HEATED (TUBING) ×3 IMPLANT

## 2016-07-28 NOTE — Progress Notes (Signed)
Ambulated to end of hallway after inguinal hernia repair. Tolerated well.

## 2016-07-28 NOTE — Progress Notes (Signed)
Dx w shingles 07-17-16 and completed course of valtrex and steroids. , left upper back, No active outbreak at present

## 2016-07-28 NOTE — Anesthesia Procedure Notes (Addendum)
Procedure Name: Intubation Date/Time: 07/28/2016 10:10 AM Performed by: Montel Clock Pre-anesthesia Checklist: Patient identified, Emergency Drugs available, Suction available, Patient being monitored and Timeout performed Patient Re-evaluated:Patient Re-evaluated prior to inductionOxygen Delivery Method: Circle system utilized Preoxygenation: Pre-oxygenation with 100% oxygen Intubation Type: IV induction Ventilation: Mask ventilation without difficulty and Oral airway inserted - appropriate to patient size Laryngoscope Size: 3 and Glidescope Grade View: Grade II Tube type: Oral Tube size: 7.5 mm Number of attempts: 1 Airway Equipment and Method: Stylet Placement Confirmation: ETT inserted through vocal cords under direct vision,  positive ETCO2 and breath sounds checked- equal and bilateral Secured at: 21 cm Tube secured with: Tape Dental Injury: Teeth and Oropharynx as per pre-operative assessment  Difficulty Due To: Difficult Airway- due to immobile epiglottis Comments: First attempt by paramedic student Sonia Side), ETT passed into esophagus. Second attempt by CRNA MAC 3, poor grade 2 view, difficult to maintain unable to pass ETT. Glidescope 3, Grade 2 view, ETT passed through cords, some difficulty lifting epiglottis.

## 2016-07-28 NOTE — Progress Notes (Signed)
Recent HX shingles reported to anesthesia, Anthony Brock

## 2016-07-28 NOTE — H&P (Signed)
Anthony Brock 05/12/2016 10:02 AM Location: [facility] Patient #: [id number] DOB: 09-Jul-1955 Undefined / Language: [place] / Race: Black or African American Male  Patient Care Team: Dois Davenport, MD as PCP - General (Family Medicine) Karie Soda, MD as Consulting Physician (General Surgery) Runell Gess, MD as Consulting Physician (Cardiology)   History of Present Illness   The patient is a 61 year old male who presents with an inguinal hernia. Note for "Inguinal hernia": Is a male sent for consultation for concern of left inguinal hernia by Dr. Nadyne Coombes.  Active male. Has some distal peripheral vascular disease. His had carotid stenting in 2016. Finally quit smoking. He had recent placement of a drug-eluting stent in his superficial femoral artery 04/20/2016 from a left groin approach. Apparently during some of this workup with groin swelling. Left inguinal hernia was noted. In 2004 he had a open right inguinal hernia repair with3x6cm [facility] mesh by Dr Carolynne Edouard without problems. Patient feels like he might had a lump there for the past few years. Not particularly bothersome enlarging. However after his catheterization and become mention more larger and symptomatic. He is a diabetic. Last A1c around 7. Glucose is in the 1 teens. Recovering from his stenting. He is concerned about getting back to work given the moderate groin pain with even light lifting. 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 He is walking much better after the stenting. Moves his bowels twice a day. No history of infections. Has occasional glass of alcohol. Mild reflux. He is hoping to get hernia repaired as soon as possible so he can get back to work.  Patient does not hold his Plavix before the first surgery date.  Surgery postponed..  Has cardiac clearance.    He is readied for surgery  Past Surgical History  Christianne Dolin, RMA; 05/12/2016 10:03 AM) Bypass Surgery for Poor Blood Flow to Legs  Carotid Artery Surgery  Left. Open Inguinal Hernia Surgery  Right.  Diagnostic Studies History Christianne Dolin, [place]; 05/12/2016 10:03 AM) Colonoscopy  never  Allergies Christianne Dolin, RMA; 05/12/2016 10:03 AM) No Known Drug Allergies 05/12/2016  Medication History Christianne Dolin, RMA; 05/12/2016 10:04 AM) Atorvastatin Calcium (  Tablet, Oral) Active. Clopidogrel Bisulfate (  Tablet, Oral) Active. DilTIAZem HCl ER Coated Beads (  Capsule ER 24HR, Oral) Active. Gabapentin (  Capsule, Oral) Active. Janumet (50-500MG  Tablet, Oral) Active. Lisinopril (  Tablet, Oral) Active. Luzu (1% Cream, External) Active. OneTouch Ultra Blue (In Vitro) Active. PredniSONE (  Tablet, Oral) Active. Viagra (  Tablet, Oral) Active. Medications Reconciled  Social History Christianne Dolin, [place]; 05/12/2016 10:03 AM) Alcohol use  Occasional alcohol use. Caffeine use  Coffee, Tea. Illicit drug use  Prefer to discuss with provider. Tobacco use  Former smoker.  Family History Christianne Dolin, [place]; 05/12/2016 10:03 AM) Alcohol Abuse  Brother. Arthritis  Mother, Sister. Diabetes Mellitus  Family Members In General. Heart Disease  Father. Heart disease in male family member before age 23  Hypertension  Mother. Respiratory Condition  Father. Thyroid problems  Daughter.  Other Problems Christianne Dolin, RMA; 05/12/2016 10:03 AM) Gastroesophageal Reflux Disease  High blood pressure  Inguinal Hernia  Vascular Disease     Review of Systems Christianne Dolin RMA; 05/12/2016 10:03 AM) General Not Present- Appetite Loss, Chills, Fatigue, Fever, Night Sweats, Weight Gain and Weight Loss. Skin Not Present- Change in Wart/Mole, Dryness, Hives, Jaundice, New Lesions, Non-Healing Wounds, Rash and Ulcer. HEENT Present- Seasonal  Allergies and Wears glasses/contact  lenses. Not Present- Earache, Hearing Loss, Hoarseness, Nose Bleed, Oral Ulcers, Ringing in the Ears, Sinus Pain, Sore Throat, Visual Disturbances and Yellow Eyes. Respiratory Present- Snoring. Not Present- Bloody sputum, Chronic Cough, Difficulty Breathing and Wheezing. Breast Not Present- Breast Mass, Breast Pain, Nipple Discharge and Skin Changes. Cardiovascular Not Present- Chest Pain, Difficulty Breathing Lying Down, Leg Cramps, Palpitations, Rapid Heart Rate, Shortness of Breath and Swelling of Extremities. Gastrointestinal Present- Indigestion. Not Present- Abdominal Pain, Bloating, Bloody Stool, Change in Bowel Habits, Chronic diarrhea, Constipation, Difficulty Swallowing, Excessive gas, Gets full quickly at meals, Hemorrhoids, Nausea, Rectal Pain and Vomiting. Male Genitourinary Not Present- Blood in Urine, Change in Urinary Stream, Frequency, Impotence, Nocturia, Painful Urination, Urgency and Urine Leakage. Musculoskeletal Present- Swelling of Extremities. Not Present- Back Pain, Joint Pain, Joint Stiffness, Muscle Pain and Muscle Weakness. Neurological Not Present- Decreased Memory, Fainting, Headaches, Numbness, Seizures, Tingling, Tremor, Trouble walking and Weakness. Psychiatric Not Present- Anxiety, Bipolar, Change in Sleep Pattern, Depression, Fearful and Frequent crying. Endocrine Present- New Diabetes. Not Present- Cold Intolerance, Excessive Hunger, Hair Changes, Heat Intolerance and Hot flashes. Hematology Not Present- Blood Thinners, Easy Bruising, Excessive bleeding, Gland problems, HIV and Persistent Infections.  Vitals Christianne Dolin RMA; 05/12/2016 10:05 AM) 05/12/2016 10:04 AM Weight: 190 lb Height: 73in Body Surface Area: 2.11 m Body Mass Index: 25.07 kg/m  Temp.: 39F  Pulse: 57 (Regular)  BP: 160/80 (Sitting, Left Arm, Standard)    There were no vitals taken for this visit.    Physical Exam [name] MD; 05/12/2016 1:35 PM) General Mental  Status-Alert. General Appearance-Not in acute distress, Not Sickly. Orientation-Oriented X3. Hydration-Well hydrated. Voice-Normal.  Integumentary Global Assessment Upon inspection and palpation of skin surfaces of the - Axillae: non-tender, no inflammation or ulceration, no drainage. and Distribution of scalp and body hair is normal. General Characteristics Temperature - normal warmth is noted.  Head and Neck Head-normocephalic, atraumatic with no lesions or palpable masses. Face Global Assessment - atraumatic, no absence of expression. Neck Global Assessment - no abnormal movements, no bruit auscultated on the right, no bruit auscultated on the left, no decreased range of motion, non-tender. Trachea-midline. Thyroid Gland Characteristics - non-tender.  Eye Eyeball - Left-Extraocular movements intact, No Nystagmus. Eyeball - Right-Extraocular movements intact, No Nystagmus. Cornea - Left-No Hazy. Cornea - Right-No Hazy. Sclera/Conjunctiva - Left-No scleral icterus, No Discharge. Sclera/Conjunctiva - Right-No scleral icterus, No Discharge. Pupil - Left-Direct reaction to light normal. Pupil - Right-Direct reaction to light normal.  ENMT Ears Pinna - Left - no drainage observed, no generalized tenderness observed. Right - no drainage observed, no generalized tenderness observed. Nose and Sinuses External Inspection of the Nose - no destructive lesion observed. Inspection of the nares - Left - quiet respiration. Right - quiet respiration. Mouth and Throat Lips - Upper Lip - no fissures observed, no pallor noted. Lower Lip - no fissures observed, no pallor noted. Nasopharynx - no discharge present. Oral Cavity/Oropharynx - Tongue - no dryness observed. Oral Mucosa - no cyanosis observed. Hypopharynx - no evidence of airway distress observed.  Chest and Lung Exam Inspection Movements - Normal and Symmetrical. Accessory muscles - No use of accessory  muscles in breathing. Palpation Palpation of the chest reveals - Non-tender. Auscultation Breath sounds - Normal and Clear.  Cardiovascular Auscultation Rhythm - Regular. Murmurs & Other Heart Sounds - Auscultation of the heart reveals - No Murmurs and No Systolic Clicks.  Abdomen Inspection Inspection of the abdomen reveals - No Visible  peristalsis and No Abnormal pulsations. Umbilicus - No Bleeding, No Urine drainage. Palpation/Percussion Palpation and Percussion of the abdomen reveal - Soft, Non Tender, No Rebound tenderness, No Rigidity (guarding) and No Cutaneous hyperesthesia. Note: Abdomen soft. Nontender, nondistended. No guarding. No diastasis. No umbilical nor other hernias   Male Genitourinary Sexual Maturity Tanner 5 - Adult hair pattern and Adult penile size and shape. Note: Obvious left groin bulge. Sensitive but reducible left inguinal hernia. Well-healed cutdown incision from prior femoral artery catheterization earlier this month. No hematoma or seroma.    Old incision at right groin with no evidence of recurrent right inguinal hernia. Normal external genitalia. Uncircumcised. Epididymi, testes, and spermatic cords normal without any masses.   Peripheral Vascular Upper Extremity Inspection - Left - No Cyanotic nailbeds, Not Ischemic. Right - No Cyanotic nailbeds, Not Ischemic.  Neurologic Neurologic evaluation reveals -normal attention span and ability to concentrate, able to name objects and repeat phrases. Appropriate fund of knowledge , normal sensation and normal coordination. Mental Status Affect - not angry, not paranoid. Cranial Nerves-Normal Bilaterally. Gait-Normal.  Neuropsychiatric Mental status exam performed with findings of-able to articulate well with normal speech/language, rate, volume and coherence, thought content normal with ability to perform basic computations and apply abstract reasoning and no evidence of hallucinations,  delusions, obsessions or homicidal/suicidal ideation.  Musculoskeletal Global Assessment Spine, Ribs and Pelvis - no instability, subluxation or laxity. Right Upper Extremity - no instability, subluxation or laxity.  Lymphatic Head & Neck  General Head & Neck Lymphatics: Bilateral - Description - No Localized lymphadenopathy. Axillary  General Axillary Region: Bilateral - Description - No Localized lymphadenopathy. Femoral & Inguinal  Generalized Femoral & Inguinal Lymphatics: Left - Description - No Localized lymphadenopathy. Right - Description - No Localized lymphadenopathy.    Assessment & Plan [name] MD; 05/12/2016 1:38 PM) LEFT INGUINAL HERNIA (K40.90) Impression: Reducible left inguinal hernia causing some discomfort and limiting ability to do intense work job.  I think he would benefit from surgical repair. I think it's reasonable to do light duty with less than 20 pounds lifting until surgery is done, return to light duty about 2-3 weeks from surgery most likely unrestricted 6 weeks after surgery. He suspects he won't be able to get back to his job since they will require an unlimited heavy lifting until the hernia repair is done and he is released from surgery. Therefore, we will try and expedite surgical repair.  Cardiac clearance since he just had stenting of a femoral artery. He's only on aspirin now, so I suspect they will not delay surgery but would like to double check with his cardiologist, Dr. Allyson Sabal. Once cleared, plan surgery. Try and do it sooner to minimize his time out of work.  Reasonable to control his pain with Tylenol and Ice / Heat until surgery can be done Current Plans I recommended obtaining preoperative cardiac clearance. I am concerned about the health of the patient and the ability to tolerate the operation. History of atherosclerotic disease status post stenting of superficial femoral artery earlier this month. Therefore, we will request  clearance by cardiology to better assess operative risk & see if a reevaluation, further workup, etc is needed. Also recommendations on how medications such as for anticoagulation and blood pressure should be managed/held/restarted after surgery. Pt Education - [facility] Pain control - tylenol only: discussed with patient and provided information.  PREOP - ING HERNIA - ENCOUNTER FOR PREOPERATIVE EXAMINATION FOR GENERAL SURGICAL PROCEDURE (Z01.818) Current Plans You are  being scheduled for surgery- Our schedulers will call you.  You should hear from our office's scheduling department within 5 working days about the location, date, and time of surgery. We try to make accommodations for patient's preferences in scheduling surgery, but sometimes the OR schedule or the surgeon's schedule prevents [place] from making those accommodations.  If you have not heard from our office [phone]) in 5 working days, call the office and ask for your surgeon's nurse.  If you have other questions about your diagnosis, plan, or surgery, call the office and ask for your surgeon's nurse.  Written instructions provided The anatomy & physiology of the abdominal wall and pelvic floor was discussed. The pathophysiology of hernias in the inguinal and pelvic region was discussed. Natural history risks such as progressive enlargement, pain, incarceration, and strangulation was discussed. Contributors to complications such as smoking, obesity, diabetes, prior surgery, etc were discussed.  I feel the risks of no intervention will lead to serious problems that outweigh the operative risks; therefore, I recommended surgery to reduce and repair the hernia. I explained laparoscopic techniques with possible need for an open approach. I noted usual use of mesh to patch and/or buttress hernia repair  Risks such as bleeding, infection, abscess, need for further treatment, heart attack, death, and other risks were discussed. I noted  a good likelihood this will help address the problem. Goals of post-operative recovery were discussed as well. Possibility that this will not correct all symptoms was explained. I stressed the importance of low-impact activity, aggressive pain control, avoiding constipation, & not pushing through pain to minimize risk of post-operative chronic pain or injury. Possibility of reherniation was discussed. We will work to minimize complications.  An educational handout further explaining the pathology & treatment options was given as well. Questions were answered. The patient expresses understanding & wishes to proceed with surgery.  Pt Education - Pamphlet Given - Laparoscopic Hernia Repair: discussed with patient and provided information. Pt Education - [facility] Hernia Post-Op HCI (Vivan Agostino): discussed with patient and provided information.  [name], M.D., F.A.C.S. Gastrointestinal and Minimally Invasive Surgery [facility] [address] [place], [place] [address] [phone] Main / Paging

## 2016-07-28 NOTE — Anesthesia Postprocedure Evaluation (Signed)
Anesthesia Post Note  Patient: Anthony Brock  Procedure(s) Performed: Procedure(s) (LRB): LAPAROSCOPIC BILATERAL INGUINAL HERNIA REPAIR LEFT (Left) INSERTION OF MESH (Left)  Patient location during evaluation: PACU Anesthesia Type: General Level of consciousness: awake and alert and oriented Pain management: pain level controlled Vital Signs Assessment: post-procedure vital signs reviewed and stable Respiratory status: spontaneous breathing, nonlabored ventilation and respiratory function stable Cardiovascular status: blood pressure returned to baseline and stable Postop Assessment: no signs of nausea or vomiting Anesthetic complications: no       Last Vitals:  Vitals:   07/28/16 1200 07/28/16 1215  BP: (!) 151/70 130/75  Pulse: (!) 51 (!) 53  Resp: 11 15  Temp:  36.3 C    Last Pain:  Vitals:   07/28/16 1230  TempSrc:   PainSc: 0-No pain                 Berwyn Bigley A.

## 2016-07-28 NOTE — Interval H&P Note (Signed)
History and Physical Interval Note:  07/28/2016 9:15 AM  Anthony Brock  has presented today for surgery, with the diagnosis of LEFT POSSIBLE RIGHT INGUINAL HERNIAS  The various methods of treatment have been discussed with the patient and family. After consideration of risks, benefits and other options for treatment, the patient has consented to  Procedure(s): LAPAROSCOPIC BILATERAL INGUINAL HERNIA REPAIR LEFT POSSIBLE RIGHT (Left) INSERTION OF MESH (Left) as a surgical intervention .  The patient's history has been reviewed, patient examined, no change in status, stable for surgery.  I have reviewed the patient's chart and labs.  Questions were answered to the patient's satisfaction.     [name] C.

## 2016-07-28 NOTE — Discharge Instructions (Signed)
Restart your blood thinner, Plavix, on Thursday morning, 07/30/2016  HERNIA REPAIR: POST OP INSTRUCTIONS  ######################################################################  EAT Gradually transition to a high fiber diet with a fiber supplement over the next few weeks after discharge.  Start with a pureed / full liquid diet (see below)  WALK Walk an hour a day.  Control your pain to do that.    CONTROL PAIN Control pain so that you can walk, sleep, tolerate sneezing/coughing, go up/down stairs.  HAVE A BOWEL MOVEMENT DAILY Keep your bowels regular to avoid problems.  OK to try a laxative to override constipation.  OK to use an antidairrheal to slow down diarrhea.  Call if not better after 2 tries  CALL IF YOU HAVE PROBLEMS/CONCERNS Call if you are still struggling despite following these instructions. Call if you have concerns not answered by these instructions  ######################################################################    1. DIET: Follow a light bland diet the first 24 hours after arrival home, such as soup, liquids, crackers, etc.  Be sure to include lots of fluids daily.  Avoid fast food or heavy meals as your are more likely to get nauseated.  Eat a low fat the next few days after surgery. 2. Take your usually prescribed home medications unless otherwise directed. 3. PAIN CONTROL: a. Pain is best controlled by a usual combination of three different methods TOGETHER: i. Ice/Heat ii. Over the counter pain medication iii. Prescription pain medication b. Most patients will experience some swelling and bruising around the hernia(s) such as the bellybutton, groins, or old incisions.  Ice packs or heating pads (30-60 minutes up to 6 times a day) will help. Use ice for the first few days to help decrease swelling and bruising, then switch to heat to help relax tight/sore spots and speed recovery.  Some people prefer to use ice alone, heat alone, alternating between ice &  heat.  Experiment to what works for you.  Swelling and bruising can take several weeks to resolve.   c. It is helpful to take an over-the-counter pain medication regularly for the first few weeks.  Choose one of the following that works best for you: i. Naproxen (Aleve, etc)  Two  tabs twice a day ii. Ibuprofen (Advil, etc) Three  tabs four times a day (every meal & bedtime) iii. Acetaminophen (Tylenol, etc) 325-650mg  four times a day (every meal & bedtime) d. A  prescription for pain medication should be given to you upon discharge.  Take your pain medication as prescribed.  i. If you are having problems/concerns with the prescription medicine (does not control pain, nausea, vomiting, rash, itching, etc), please call us [phone] to see if we need to switch you to a different pain medicine that will work better for you and/or control your side effect better. ii. If you need a refill on your pain medication, please contact your pharmacy.  They will contact our office to request authorization. Prescriptions will not be filled after 5 pm or on week-ends. 4. Avoid getting constipated.  Between the surgery and the pain medications, it is common to experience some constipation.  Increasing fluid intake and taking a fiber supplement (such as Metamucil, Citrucel, FiberCon, MiraLax, etc) 1-2 times a day regularly will usually help prevent this problem from occurring.  A mild laxative (prune juice, Milk of Magnesia, MiraLax, etc) should be taken according to package directions if there are no bowel movements after 48 hours.   5. Wash / shower every day.  You may shower over  the dressings as they are waterproof.   6. Remove your waterproof bandages 5 days after surgery.  You may leave the incision open to air.  You may replace a dressing/Band-Aid to cover the incision for comfort if you wish.  Continue to shower over incision(s) after the dressing is off.    7. ACTIVITIES as tolerated:   a. You  may resume regular (light) daily activities beginning the next day--such as daily self-care, walking, climbing stairs--gradually increasing activities as tolerated.  If you can walk 30 minutes without difficulty, it is safe to try more intense activity such as jogging, treadmill, bicycling, low-impact aerobics, swimming, etc. b. Save the most intensive and strenuous activity for last such as sit-ups, heavy lifting, contact sports, etc  Refrain from any heavy lifting or straining until you are off narcotics for pain control.   c. DO NOT PUSH THROUGH PAIN.  Let pain be your guide: If it hurts to do something, don't do it.  Pain is your body warning you to avoid that activity for another week until the pain goes down. d. You may drive when you are no longer taking prescription pain medication, you can comfortably wear a seatbelt, and you can safely maneuver your car and apply brakes. e. Bonita Quin may have sexual intercourse when it is comfortable.  8. FOLLOW UP in our office a. Please call [facility] at [phone] to set up an appointment to see your surgeon in the office for a follow-up appointment approximately 2-3 weeks after your surgery. b. Make sure that you call for this appointment the day you arrive home to insure a convenient appointment time. 9.  IF YOU HAVE DISABILITY OR FAMILY LEAVE FORMS, BRING THEM TO THE OFFICE FOR PROCESSING.  DO NOT GIVE THEM TO YOUR DOCTOR.  WHEN TO CALL us [phone]: 1. Poor pain control 2. Reactions / problems with new medications (rash/itching, nausea, etc)  3. Fever over 101.5 F (38.5 C) 4. Inability to urinate 5. Nausea and/or vomiting 6. Worsening swelling or bruising 7. Continued bleeding from incision. 8. Increased pain, redness, or drainage from the incision   The clinic staff is available to answer your questions during regular business hours (8:30am-5pm).  Please dont hesitate to call and ask to speak to one of our nurses for clinical concerns.   If you  have a medical emergency, go to the nearest emergency room or call 911.  A surgeon from [facility] is always on call at the hospitals in [facility], [place] [address], [place], [place]  [address] ?  [address] [address], [place], [place]   [address] MAIN: [phone] ? TOLL FREE: [phone] ? FAX: [phone] [email or web address]    General Anesthesia, Adult, Care After These instructions provide you with information about caring for yourself after your procedure. Your health care provider may also give you more specific instructions. Your treatment has been planned according to current medical practices, but problems sometimes occur. Call your health care provider if you have any problems or questions after your procedure. What can I expect after the procedure? After the procedure, it is common to have:  Vomiting.  A sore throat.  Mental slowness. It is common to feel:  Nauseous.  Cold or shivery.  Sleepy.  Tired.  Sore or achy, even in parts of your body where you did not have surgery. Follow these instructions at home: For at least 24 hours after the procedure:   Do not:  Participate in activities where you could fall or become injured.  Drive.  Use heavy machinery.  Drink alcohol.  Take sleeping pills or medicines that cause drowsiness.  Make important decisions or sign legal documents.  Take care of children on your own.  Rest. Eating and drinking   If you vomit, drink water, juice, or soup when you can drink without vomiting.  Drink enough fluid to keep your urine clear or pale yellow.  Make sure you have little or no nausea before eating solid foods.  Follow the diet recommended by your health care provider. General instructions   Have a responsible adult stay with you until you are awake and alert.  Return to your normal activities as told by your health care provider. Ask your  health care provider what activities are safe for you.  Take over-the-counter and prescription medicines only as told by your health care provider.  If you smoke, do not smoke without supervision.  Keep all follow-up visits as told by your health care provider. This is important. Contact a health care provider if:  You continue to have nausea or vomiting at home, and medicines are not helpful.  You cannot drink fluids or start eating again.  You cannot urinate after 8-12 hours.  You develop a skin rash.  You have fever.  You have increasing redness at the site of your procedure. Get help right away if:  You have difficulty breathing.  You have chest pain.  You have unexpected bleeding.  You feel that you are having a life-threatening or urgent problem. This information is not intended to replace advice given to you by your health care provider. Make sure you discuss any questions you have with your health care provider. Document Released: 07/06/2000 Document Revised: 09/02/2015 Document Reviewed: 03/14/2015 Elsevier Interactive Patient Education  2017 [employer].

## 2016-07-28 NOTE — Transfer of Care (Signed)
Immediate Anesthesia Transfer of Care Note  Patient: Anthony Brock  Procedure(s) Performed: Procedure(s): LAPAROSCOPIC BILATERAL INGUINAL HERNIA REPAIR LEFT (Left) INSERTION OF MESH (Left)  Patient Location: PACU  Anesthesia Type:General  Level of Consciousness:  sedated, patient cooperative and responds to stimulation  Airway & Oxygen Therapy:Patient Spontanous Breathing and Patient connected to face mask oxgen  Post-op Assessment:  Report given to PACU RN and Post -op Vital signs reviewed and stable  Post vital signs:  Reviewed and stable  Last Vitals:  Vitals:   07/28/16 0813  BP: 129/79  Pulse: (!) 54  Resp: 18  Temp: 36.5 C    Complications: No apparent anesthesia complications

## 2016-07-28 NOTE — Op Note (Addendum)
07/28/2016  11:30 AM  PATIENT:  Anthony Brock  61 y.o. male  Patient Care Team: Dois Davenport, MD as PCP - General (Family Medicine) Karie Soda, MD as Consulting Physician (General Surgery) Runell Gess, MD as Consulting Physician (Cardiology)  PRE-OPERATIVE DIAGNOSIS:  LEFT POSSIBLE RIGHT INGUINAL HERNIAS  POST-OPERATIVE DIAGNOSIS:  LEFT INGUINAL HERNIA  PROCEDURE:   LAPAROSCOPIC INGUINAL HERNIA REPAIR LEFT INSERTION OF MESH  SURGEON:  [name], MD  ASSISTANT:  Dylan Plocki, PA-S, [employer]  ANESTHESIA:   regional and general Ilioinguinal, genitofemoral, and spermatic cord nerve blocks  EBL:  No intake/output data recorded.  Delay start of Pharmacological VTE agent (>24hrs) due to surgical blood loss or risk of bleeding:  NO  DRAINS: none   SPECIMEN:  No Specimen  DISPOSITION OF SPECIMEN:  N/A  COUNTS:  YES  PLAN OF CARE: Discharge to home after PACU  PATIENT DISPOSITION:  PACU - hemodynamically stable.  INDICATION:   The anatomy & physiology of the abdominal wall and pelvic floor was discussed.  The pathophysiology of hernias in the inguinal and pelvic region was discussed.  Natural history risks such as progressive enlargement, pain, incarceration & strangulation was discussed.   Contributors to complications such as smoking, obesity, diabetes, prior surgery, etc were discussed.    I feel the risks of no intervention will lead to serious problems that outweigh the operative risks; therefore, I recommended surgery to reduce and repair the hernia.  I explained laparoscopic techniques with possible need for an open approach.  I noted usual use of mesh to patch and/or buttress hernia repair  Risks such as bleeding, infection, abscess, need for further treatment, heart attack, death, and other risks were discussed.  I noted a good likelihood this will help address the problem.   Goals of post-operative recovery were discussed as well.  Possibility  that this will not correct all symptoms was explained.  I stressed the importance of low-impact activity, aggressive pain control, avoiding constipation, & not pushing through pain to minimize risk of post-operative chronic pain or injury. Possibility of reherniation was discussed.  We will work to minimize complications.     An educational handout further explaining the pathology & treatment options was given as well.  Questions were answered.  The patient expresses understanding & wishes to proceed with surgery.  OR FINDINGS: Moderate size direct inguinal hernia on the left side.  No indirect femoral after an hernia.  On the right, intact inguinal hernia repair.  No evidence of recurrence.  No femoral nor obturator hernia.  DESCRIPTION:   The patient was identified & brought into the operating room. The patient was positioned supine with arms tucked. SCDs were active during the entire case. The patient underwent general anesthesia without any difficulty.  The abdomen was prepped and draped in a sterile fashion. A Surgical Timeout confirmed our plan.  I made a transverse incision through the inferior umbilical fold.  I made a small transverse nick through the anterior rectus fascia contralateral to the inguinal hernia side and placed a 0-vicryl stitch through the fascia.  I placed a Hasson trocar into the preperitoneal plane.  Entry was clean.  We induced carbon dioxide insufflation. Camera inspection revealed no injury.  I used a 10mm angled scope to bluntly free the peritoneum off the infraumbilical anterior abdominal wall.  I created enough of a preperitoneal pocket to place 5mm ports into the right & left mid-abdomen into this preperitoneal cavity.  I used blunt &  focused sharp dissection to free the peritoneum off the flank and down to the pubic rim.  I focused on the left side where the hernia was detected preoperatively.  I freed the anteriolateral bladder wall off the anteriolateral pelvic wall  on that side, sparing midline attachments.    I could see the peritoneum going up into a hernia defect at the direct space.  I freed the peritoneum off the spermatic vessels & vas deferens.  I did reduce a moderate-sized spermatic cord lipoma out of the internal ring.  No strong evidence of indirect inguinal hernia though.  I freed peritoneum off the retroperitoneum along the psoas muscle.  I did do dissection on the contralateral side.  I do not see any evidence of hernia recurrence be given the fact he had a very large direct hernia elected to continue preperitoneal dissection in a similar, mirror-image fashion to have good overlap of mesh  I chose a 15x15 cm sheet of mesh ( ultra-lightweight polypropylene = Ultrapro) to patch the hernia.  I cut a sigmoid-shaped slit within a side of the mesh, about 6cm from one edge.  I placed the mesh into the preperitoneal space & laid it as a diamond such that the inferior point had a 6x6 cm corner flap resting in the true pelvis, covering the obturator & femoral foramina.    I placed a similar mesh in a mirror image fashion on the other side.  This allowed the corners to overlap.  However there was grade overlap on the direct hernia since it was rather large and more medial than expected.  Therefore placed a third mesh as a diamond.  The inferior tip resting very inferiorly over the data dome of the bladder.  Lateral wings easily overlapped both the direct and indirect spaces.  This provided much better coverage.  I allowed the bladder to return to the pubis, this helping tuck the corners of the mesh in the anteriolateral pelvis.  This provided >2 inch coverage around the hernia.   Because the defects well covered with overlapping mesh, held off on placing any tacks to avoid possible nerve injury.  I held the hernia sac cephalad & evacuated carbon dioxide.  I closed the infraumbilical wound with 0-vicryl suture at the fascia and 4-0 monocryl stitch at the skin.  Sterile  dressings were applied. The patient was extubated & arrived in the PACU in stable condition..  I had discussed postoperative care with the patient in the holding area.   I am about to locate the patient's family and discuss operative findings and postoperative goals / instructions.  Written instructions are written &  will be given to the patient at discharge as well.  [name], M.D., F.A.C.S. Gastrointestinal and Minimally Invasive Surgery [facility] [address] [place], [place] [address] [phone] Main / Paging

## 2016-07-28 NOTE — Anesthesia Preprocedure Evaluation (Signed)
Anesthesia Evaluation  Patient identified by MRN, date of birth, ID band Patient awake    Reviewed: Allergy & Precautions, NPO status , Patient's Chart, lab work & pertinent test results  Airway Mallampati: II  TM Distance: >3 FB Neck ROM: Full    Dental no notable dental hx. (+) Teeth Intact   Pulmonary former smoker,    Pulmonary exam normal breath sounds clear to auscultation       Cardiovascular hypertension, Pt. on medications + Peripheral Vascular Disease  Normal cardiovascular exam Rhythm:Regular Rate:Normal     Neuro/Psych Diabetic neuropathy negative psych ROS   GI/Hepatic Neg liver ROS, GERD  Medicated,  Endo/Other  diabetes, Well Controlled, Type 2, Oral Hypoglycemic AgentsHyperlipidemia  Renal/GU negative Renal ROS  negative genitourinary   Musculoskeletal  (+) Arthritis , Left Inguinal hernia- possible bilateral   Abdominal   Peds  Hematology negative hematology ROS (+)   Anesthesia Other Findings   Reproductive/Obstetrics                             Anesthesia Physical Anesthesia Plan  ASA: III  Anesthesia Plan: General   Post-op Pain Management:    Induction: Intravenous  Airway Management Planned: Oral ETT  Additional Equipment:   Intra-op Plan:   Post-operative Plan: Extubation in OR  Informed Consent: I have reviewed the patients History and Physical, chart, labs and discussed the procedure including the risks, benefits and alternatives for the proposed anesthesia with the patient or authorized representative who has indicated his/her understanding and acceptance.   Dental advisory given  Plan Discussed with: CRNA, Anesthesiologist and Surgeon  Anesthesia Plan Comments:         Anesthesia Quick Evaluation

## 2016-07-29 ENCOUNTER — Encounter (HOSPITAL_COMMUNITY): Payer: Self-pay | Admitting: Surgery

## 2016-08-13 ENCOUNTER — Other Ambulatory Visit: Payer: Self-pay | Admitting: Physician Assistant

## 2016-08-25 ENCOUNTER — Encounter: Payer: Self-pay | Admitting: Registered"

## 2016-08-25 ENCOUNTER — Encounter: Payer: BLUE CROSS/BLUE SHIELD | Attending: Family Medicine | Admitting: Registered"

## 2016-08-25 DIAGNOSIS — I10 Essential (primary) hypertension: Secondary | ICD-10-CM | POA: Diagnosis not present

## 2016-08-25 DIAGNOSIS — E119 Type 2 diabetes mellitus without complications: Secondary | ICD-10-CM | POA: Diagnosis present

## 2016-08-25 DIAGNOSIS — Z713 Dietary counseling and surveillance: Secondary | ICD-10-CM | POA: Diagnosis not present

## 2016-08-25 DIAGNOSIS — E118 Type 2 diabetes mellitus with unspecified complications: Secondary | ICD-10-CM

## 2016-08-25 NOTE — Patient Instructions (Signed)
Plan:  Aim for 3-4 Carb Choices per meal (45-60 grams)  Aim for 0-2 Carbs per snack if hungry  Include protein with your meals and snacks Consider reading food labels for Total Carbohydrate and Sat Fat Grams of foods Continue increasing your activity level daily as tolerated Continue checking BG at alternate times per day as directed by MD  Consider checking more often to see what foods are affecting your BG Continue taking medication as directed by MD

## 2016-08-25 NOTE — Progress Notes (Signed)
Diabetes Self-Management Education  Visit Type: First/Initial  Appt. Start Time: 1530 Appt. End Time: 1640  08/25/2016  Mr. Anthony Brock, identified by name and date of birth, is a 61 y.o. male with a diagnosis of Diabetes: Type 2.   ASSESSMENT Pt states his doctor changed his medication from metformin to [place] 6 months ago after his A1C was not coming down enough from his highest level of 10.2. Pt states his A1C was 5.6 last summer. Since then Pt states he has had 2 surgeries and shingles. Pt reports he has not been working and has been much less active since his surgeries, but currently working on getting more active under his doctor's care.      Diabetes Self-Management Education - 08/25/16 1539      Visit Information   Visit Type First/Initial     Initial Visit   Diabetes Type Type 2   Are you currently following a meal plan? No   Are you taking your medications as prescribed? Yes   Date Diagnosed 08/2015     Health Coping   How would you rate your overall health? Good     Psychosocial Assessment   Patient Belief/Attitude about Diabetes Motivated to manage diabetes   Other persons present Patient;Spouse/SO   What is the last grade level you completed in school? 3 yrs college     Complications   Last HgB A1C per patient/outside source 6.6 %  08/18/2016   How often do you check your blood sugar? 1-2 times/day   Fasting Blood glucose range (mg/dL) 130-179  87-150   Postprandial Blood glucose range (mg/dL) 130-179  night 149   Number of hypoglycemic episodes per month 0   Have you had a dilated eye exam in the past 12 months? Yes   Have you had a dental exam in the past 12 months? No   Are you checking your feet? Yes   How many days per week are you checking your feet? 2     Dietary Intake   Breakfast sausage, grits, eggs, coffee with sugar OR oatmeal OR boiled egg OR fig bars   Snack (morning) none OR fruit   Lunch left overs    Snack (afternoon) apple   Dinner  pulled pork, coleslaw, spinach, kale, mac & cheese OR chicken masala OR ribeye oR salmon, starch & green OR beans   Snack (evening) cheesits and grapes   Beverage(s) water, coffee, ginger ale occassionally, kool aid 16-20 oz     Exercise   Exercise Type Light (walking / raking leaves)   How many days per week to you exercise? 2   How many minutes per day do you exercise? 15   Total minutes per week of exercise 30     Patient Education   Previous Diabetes Education No   Disease state  Definition of diabetes, type 1 and 2, and the diagnosis of diabetes;Factors that contribute to the development of diabetes   Nutrition management  Role of diet in the treatment of diabetes and the relationship between the three main macronutrients and blood glucose level;Carbohydrate counting;Reviewed blood glucose goals for pre and post meals and how to evaluate the patients' food intake on their blood glucose level.   Physical activity and exercise  Role of exercise on diabetes management, blood pressure control and cardiac health.   Monitoring Identified appropriate SMBG and/or A1C goals.   Chronic complications Lipid levels, blood glucose control and heart disease;Relationship between chronic complications and blood glucose control;Assessed and  discussed foot care and prevention of foot problems;Retinopathy and reason for yearly dilated eye exams     Individualized Goals (developed by patient)   Nutrition Follow meal plan discussed   Physical Activity Exercise 5-7 days per week     Outcomes   Expected Outcomes Demonstrated interest in learning. Expect positive outcomes   Future DMSE PRN   Program Status Completed     Individualized Plan for Diabetes Self-Management Training:   Learning Objective:  Patient will have a greater understanding of diabetes self-management. Patient education plan is to attend individual and/or group sessions per assessed needs and concerns.  Patient Instructions  Plan:   Aim for 3-4 Carb Choices per meal (45-60 grams)  Aim for 0-2 Carbs per snack if hungry  Include protein with your meals and snacks Consider reading food labels for Total Carbohydrate and Sat Fat Grams of foods Continue increasing your activity level daily as tolerated Continue checking BG at alternate times per day as directed by MD  Consider checking more often to see what foods are affecting your BG Continue taking medication as directed by MD   Expected Outcomes:  Demonstrated interest in learning. Expect positive outcomes  Education material provided: Living Well with Diabetes, A1C conversion sheet, My Plate, Snack sheet and Carbohydrate counting sheet  If problems or questions, patient to contact team via:  Phone and mychart  Future DSME appointment: PRN

## 2016-10-21 DIAGNOSIS — I739 Peripheral vascular disease, unspecified: Secondary | ICD-10-CM | POA: Diagnosis not present

## 2016-10-22 ENCOUNTER — Encounter (HOSPITAL_COMMUNITY): Payer: BLUE CROSS/BLUE SHIELD

## 2016-10-22 ENCOUNTER — Ambulatory Visit (HOSPITAL_COMMUNITY)
Admission: RE | Admit: 2016-10-22 | Discharge: 2016-10-22 | Disposition: A | Discharge status: Home / Self Care | Payer: BLUE CROSS/BLUE SHIELD | Source: Ambulatory Visit | Attending: Cardiovascular Disease | Admitting: Cardiovascular Disease

## 2016-10-22 DIAGNOSIS — I739 Peripheral vascular disease, unspecified: Secondary | ICD-10-CM

## 2016-10-24 ENCOUNTER — Other Ambulatory Visit: Payer: Self-pay | Admitting: Cardiovascular Disease

## 2016-10-27 ENCOUNTER — Telehealth: Payer: Self-pay | Admitting: *Deleted

## 2016-10-27 NOTE — Telephone Encounter (Signed)
Left message to call back to make an appointment:  Notes recorded by Runell GessBerry, Jonathan J, MD on 10/26/2016 at 2:22 PM EDT Return office visit with me to discuss results at next available

## 2016-11-02 NOTE — Telephone Encounter (Signed)
°*  STAT* If patient is at the pharmacy, call can be transferred to refill team.   1. Which medications need to be refilled? (please list name of each medication and dose if known) Viagra  2. Which pharmacy/location (including street and city if local pharmacy) is medication to be sent to?[place] [phone]  3. Do they need a 30 day or 90 day supply?the highest amount he can get so his insurance will pay

## 2016-12-01 ENCOUNTER — Ambulatory Visit (INDEPENDENT_AMBULATORY_CARE_PROVIDER_SITE_OTHER): Payer: BLUE CROSS/BLUE SHIELD | Admitting: Cardiovascular Disease

## 2016-12-01 ENCOUNTER — Encounter: Payer: Self-pay | Admitting: Cardiovascular Disease

## 2016-12-01 VITALS — BP 130/68 | HR 54 | Ht 73.0 in | Wt 186.8 lb

## 2016-12-01 DIAGNOSIS — I6522 Occlusion and stenosis of left carotid artery: Secondary | ICD-10-CM | POA: Diagnosis not present

## 2016-12-01 DIAGNOSIS — E785 Hyperlipidemia, unspecified: Secondary | ICD-10-CM

## 2016-12-01 DIAGNOSIS — I739 Peripheral vascular disease, unspecified: Secondary | ICD-10-CM | POA: Diagnosis not present

## 2016-12-01 NOTE — Assessment & Plan Note (Signed)
History of essential hypertension blood pressure measured today at 130/68. He is on diltiazem and lisinopril. Continue current meds at current dosing

## 2016-12-01 NOTE — Assessment & Plan Note (Signed)
History of carotid artery disease status post left carotid stent placed by myself 07/17/14. His left carotid Dopplers performed 01/22/16 revealed a widely patent left carotid stent with only mild right ICA stenosis.

## 2016-12-01 NOTE — Addendum Note (Signed)
Addended by: Chana Bode on: 12/01/2016 01:15 PM   Modules accepted: Orders

## 2016-12-01 NOTE — Assessment & Plan Note (Signed)
History of peripheral arterial disease status post left and right SFA intervention in December of last year in January of this year with excellent angiographic clinical and Doppler results. Unfortunately his most recent lower extremity Dopplers show decreased ABIs, increased frequency in his mid right and proximal left SFA with recurrent claudication. I am going to arrange for him to undergo repeat angiography and intervention.

## 2016-12-01 NOTE — Patient Instructions (Signed)
   [facility] CARDIOVASCULAR DIVISION [facility] [address] [place] [address] Dept: [phone] Loc: [phone]  Anthony Brock  12/01/2016  You are scheduled for a Peripheral Angiogram on Monday, September 10 with Dr. Nanetta Batty.  1. Please arrive at the [facility] (Main Entrance A) at [facility]: [address] [place], [place] [address] at 5:30 AM (two hours before your procedure to ensure your preparation). Free valet parking service is available.   Special note: Every effort is made to have your procedure done on time. Please understand that emergencies sometimes delay scheduled procedures.  2. Diet: Do not eat or drink anything after midnight prior to your procedure except sips of water to take medications.  3. Labs: You will need to have blood drawn on Monday, September 3 in our office. There is a sign-in sheet in the lobby; no appointment needed. You do not need to be fasting.  4. Medication instructions in preparation for your procedure:  Take 1/2 dose of your insulin the night before your procedure. DO NOT take any insulin the day of your procedure.  On the morning of your procedure, take your Plavix/Clopidogrel and Aspirin and any morning medicines NOT listed above.  You may use sips of water.  5. Plan for one night stay--bring personal belongings. 6. Bring a current list of your medications and current insurance cards. 7. You MUST have a responsible person to drive you home. 8. Someone MUST be with you the first 24 hours after you arrive home or your discharge will be delayed. 9. Please wear clothes that are easy to get on and off and wear slip-on shoes.  Thank you for allowing [place] to care for you!   -- [facility]

## 2016-12-01 NOTE — Progress Notes (Signed)
12/01/2016 Anthony Brock   01/31/1956  [id number]  Primary Physician Darron Doom Maebelle Munroe, MD Primary Cardiologist: [name] Lupe Carney, [place]  HPI:  Anthony Brock is a 61 y.o. male married African-American veteran who works at [employer]. He was referred by Dr. Melony Overly at [facility] for peripheral vascular evaluation because of lifestyle limiting claudication.I last saw him in the office 03/30/16. He had carotid stenting by myself 07/17/14.[name] He has a long history of tobacco abuse smoking three-quarter pack a day for last 30 years having recently stopped smoking after his last peripheral vascular procedure.. He has never had a heart attack or stroke, and denies chest pain or shortness of breath. He states that he has had left greater than right lower extremity claudication which is lifestyle limiting for several years. Recent Dopplers performed in our office 03/20/14 revealed a right ABI 0.58 with an occluded distal right SFA and a left ABI 0.41 with a high-frequency signal in his left SFA. He underwent angiography and intervention on January 18 revealing a 80% Left internal carotid artery stenosis, and short segment occlusions in both SFAs. I was able to perform directional atherectomy on his left SFA followed by drug eluding balloon angioplasty. He had an excellent angiographic and clinical result. He ultimately underwent staged right SFA intervention with excellent angiographic and clinical result. He underwent elective left internal carotid artery stenting for high-grade (80%) asymptomatic left internal carotid artery stenosis 07/17/14. His recent carotid Dopplers performed 02/07/15 reveals to be widely patent. He does have recurrent claudication with Dopplers performed 02/07/15 revealing high-grade distal bilateral SFA restenosis. He underwent right SFA directional arthrectomy and drug-eluting balloon angioplasty on 02/15/15 with excellent angiographic and clinical result. He  underwent staged left SFA intervention by myself using directional atherectomy and drug-eluting balloon angioplasty on 02/25/15. Dopplers performed 2 weeks later that showed ABIs of 0.9 bilaterally. Recentlower extremity Dopplers performed 01/22/16 revealed recurrent bilateral SFA stenosis with slightly lower ABIs. He does complain of some mild claudication. Carotid Dopplers performed 01/24/16 revealed a widely patent left internal carotid artery stent. Because of these findings I decided to proceed with PTA and drug eluding stenting using Zilver PTX of both SFAs. I intervened on the left SFA on December 11. He underwent right SFA intervention 04/20/16. Follow-up lower extremity arterial Doppler studies performed 05/06/16 revealed widely patent stents with normal ABIs. His claudication has resolved. Unfortunately over the last month or 2 his claudication has really heard and his most recent Dopplers performed 10/22/16 revealed a decline in his right ABI 0.94 and his left ABI 0.82 with high-frequency signals in the mid right SFA and proximal left SFA.    Current Meds  Medication Sig  . aspirin EC 81 MG tablet Take 1 tablet (81 mg total) by mouth daily.  [name] atorvastatin (LIPITOR) 40 MG tablet TAKE 1 TABLET BY MOUTH EVERY EVENING  . Blood Glucose Monitoring Suppl (ONE TOUCH ULTRA MINI) w/Device KIT   . cetirizine (ZYRTEC) 10 MG tablet Take 10 mg by mouth daily as needed for allergies.   [name] clopidogrel (PLAVIX) 75 MG tablet TAKE 1 TABLET BY MOUTH DAILY  . diltiazem (CARDIZEM CD) 120 MG 24 hr capsule TAKE 1 CAPSULE BY MOUTH DAILY  . gabapentin (NEURONTIN) 300 MG capsule Take 300 mg by mouth every evening.  [name] JANUMET 50-500 MG tablet Take 1 tablet by mouth daily before breakfast.   . latanoprost (XALATAN) 0.005 % ophthalmic solution Place 1 drop into both eyes  as directed.  [name] lisinopril (PRINIVIL,ZESTRIL) 5 MG tablet TAKE ONE (1) TABLET BY MOUTH EVERY DAY (Patient taking differently: TAKE 5 MG DAILY IN THE  MORNING)  . LUZU 1 % CREA Apply 1 application topically daily as needed (athletes foot).   . methocarbamol (ROBAXIN) 750 MG tablet Take 1 tablet (750 mg total) by mouth 4 (four) times daily as needed (use for muscle cramps/pain).  . ONE TOUCH ULTRA TEST test strip   . ONETOUCH DELICA LANCETS 08M MISC   . traMADol (ULTRAM) 50 MG tablet Take 1-2 tablets (50-100 mg total) by mouth every 6 (six) hours as needed for moderate pain or severe pain.  [name] VIAGRA 100 MG tablet TAKE 1 TABLET BY MOUTH DAILY AS NEEDED FOR ERECTILE DYSFUNCTION     No Known Allergies  Social History   Social History  . Marital status: Married    Spouse name: Levada Dy  . Number of children: 2  . Years of education: 15   Occupational History  . [employer], Materials management    Social History Main Topics  . Smoking status: Former Smoker    Packs/day: 0.50    Years: 45.00    Types: Cigarettes    Quit date: 03/01/2015  . Smokeless tobacco: Never Used     Comment: 02/25/2015 "stopped smoking 02/14/2015"  . Alcohol use 0.0 oz/week     Comment: 02/25/2015 last drink was summer 2016; I've pretty much quit"  . Drug use: Yes     Comment: "might have used some drugs in my 20's"  . Sexual activity: Yes   Other Topics Concern  . Not on file   Social History Narrative   Lives with wife   caffeine- coffee, 1-2 cups daily     Review of Systems: General: negative for chills, fever, night sweats or weight changes.  Cardiovascular: negative for chest pain, dyspnea on exertion, edema, orthopnea, palpitations, paroxysmal nocturnal dyspnea or shortness of breath Dermatological: negative for rash Respiratory: negative for cough or wheezing Urologic: negative for hematuria Abdominal: negative for nausea, vomiting, diarrhea, bright red blood per rectum, melena, or hematemesis Neurologic: negative for visual changes, syncope, or dizziness All other systems reviewed and are otherwise negative except as noted  above.    Blood pressure 130/68, pulse (!) 54, height _0  (1.854 m), weight 186 lb 12.8 oz (84.7 kg).  General appearance: alert and no distress Neck: no adenopathy, no carotid bruit, no JVD, supple, symmetrical, trachea midline and thyroid not enlarged, symmetric, no tenderness/mass/nodules Lungs: clear to auscultation bilaterally Heart: regular rate and rhythm, S1, S2 normal, no murmur, click, rub or gallop Extremities: extremities normal, atraumatic, no cyanosis or edema  EKG sinus bradycardia at 54 with left axis deviation and nonspecific ST and T-wave changes. I personally reviewed this EKG.  ASSESSMENT AND PLAN:   Essential hypertension History of essential hypertension blood pressure measured today at 130/68. He is on diltiazem and lisinopril. Continue current meds at current dosing  Lt ICA stenosis- Carotid stent placed 07/17/14 History of carotid artery disease status post left carotid stent placed by myself 07/17/14. His left carotid Dopplers performed 01/22/16 revealed a widely patent left carotid stent with only mild right ICA stenosis.  PAD (peripheral artery disease) (HCC) History of peripheral arterial disease status post left and right SFA intervention in December of last year in January of this year with excellent angiographic clinical and Doppler results. Unfortunately his most recent lower extremity Dopplers show decreased ABIs, increased frequency in his mid right and  proximal left SFA with recurrent claudication. I am going to arrange for him to undergo repeat angiography and intervention.  Hyperlipidemia with target LDL less than 70 History of hyperlipidemia on statin therapy with recent lipid profile performed 05/25/16 revealing total cholesterol 121, LDL 50 and HDL of 42.      [name] FACP,FACC,FAHA, [facility] 12/01/2016 11:45 AM

## 2016-12-01 NOTE — Addendum Note (Signed)
Addended by: Evans Lance on: 12/01/2016 11:58 AM   Modules accepted: Orders

## 2016-12-01 NOTE — Assessment & Plan Note (Signed)
History of hyperlipidemia on statin therapy with recent lipid profile performed 05/25/16 revealing total cholesterol 121, LDL 50 and HDL of 42.

## 2016-12-09 ENCOUNTER — Other Ambulatory Visit: Payer: Self-pay | Admitting: Cardiovascular Disease

## 2016-12-12 LAB — CBC WITH DIFFERENTIAL/PLATELET
Basophils Absolute: 0 10*3/uL (ref 0.0–0.2)
Basos: 0 %
EOS (ABSOLUTE): 0.1 10*3/uL (ref 0.0–0.4)
EOS: 1 %
HEMOGLOBIN: 12.4 g/dL — AB (ref 13.0–17.7)
Hematocrit: 36.3 % — ABNORMAL LOW (ref 37.5–51.0)
IMMATURE GRANS (ABS): 0 10*3/uL (ref 0.0–0.1)
IMMATURE GRANULOCYTES: 0 %
LYMPHS: 56 %
Lymphocytes Absolute: 2.1 10*3/uL (ref 0.7–3.1)
MCH: 29.2 pg (ref 26.6–33.0)
MCHC: 34.2 g/dL (ref 31.5–35.7)
MCV: 86 fL (ref 79–97)
Monocytes Absolute: 0.3 10*3/uL (ref 0.1–0.9)
Monocytes: 9 %
NEUTROS ABS: 1.3 10*3/uL — AB (ref 1.4–7.0)
NEUTROS PCT: 34 %
Platelets: 228 10*3/uL (ref 150–379)
RBC: 4.24 x10E6/uL (ref 4.14–5.80)
RDW: 13.3 % (ref 12.3–15.4)
WBC: 3.8 10*3/uL (ref 3.4–10.8)

## 2016-12-12 LAB — BASIC METABOLIC PANEL
BUN/Creatinine Ratio: 12 (ref 10–24)
BUN: 14 mg/dL (ref 8–27)
CALCIUM: 9.8 mg/dL (ref 8.6–10.2)
CO2: 22 mmol/L (ref 20–29)
Chloride: 100 mmol/L (ref 96–106)
Creatinine, Ser: 1.21 mg/dL (ref 0.76–1.27)
GFR, EST AFRICAN AMERICAN: 74 mL/min/{1.73_m2} (ref >59)
GFR, EST NON AFRICAN AMERICAN: 64 mL/min/{1.73_m2} (ref >59)
Glucose: 90 mg/dL (ref 65–99)
Potassium: 4.2 mmol/L (ref 3.5–5.2)
Sodium: 138 mmol/L (ref 134–144)

## 2016-12-12 LAB — APTT: APTT: 26 s (ref 24–33)

## 2016-12-12 LAB — PROTIME-INR
INR: 1 (ref 0.8–1.2)
Prothrombin Time: 10.6 s (ref 9.1–12.0)

## 2016-12-15 ENCOUNTER — Other Ambulatory Visit: Payer: Self-pay | Admitting: Cardiovascular Disease

## 2016-12-15 DIAGNOSIS — I739 Peripheral vascular disease, unspecified: Secondary | ICD-10-CM

## 2016-12-17 ENCOUNTER — Telehealth: Payer: Self-pay | Admitting: Cardiovascular Disease

## 2016-12-17 NOTE — Telephone Encounter (Signed)
Pt is having a procedure on 12-21-16.Dr Allyson SabalBerry wanted to know what leg is bothering him worse,he said the left. On Monday he would like to have the procedure on the right leg.he says it is bothering him more.

## 2016-12-17 NOTE — Telephone Encounter (Signed)
Call placed to the patient. No answer and voicemail box was full.

## 2016-12-18 NOTE — Telephone Encounter (Signed)
Tried to call pt mailbox is full-cell spoke with  Anthony BadeAngelia Brock (wife)(DPR) she states that when pt spoke with Dr Allyson SabalBerry originally he stated that he wanted the left leg done, but now he states that the right is more painful. Also she was asking if there is anything he needs to hold before the procedure because it does not say to hold any medications on the Peripheral Angiogram procedure letter given to the pt. I reviewed and pt medications and he takes Janumet and this needs to be held(it has metformin) the night before and the morning of the procedure. Verbalizes understanding.

## 2016-12-21 ENCOUNTER — Ambulatory Visit (HOSPITAL_COMMUNITY)
Admission: RE | Admit: 2016-12-21 | Discharge: 2016-12-22 | Disposition: A | Discharge status: Home / Self Care | Payer: BLUE CROSS/BLUE SHIELD | Source: Ambulatory Visit | Attending: Cardiovascular Disease | Admitting: Cardiovascular Disease

## 2016-12-21 ENCOUNTER — Encounter (HOSPITAL_COMMUNITY)
Admission: RE | Disposition: A | Discharge status: Home / Self Care | Payer: Self-pay | Source: Ambulatory Visit | Attending: Cardiovascular Disease

## 2016-12-21 ENCOUNTER — Encounter (HOSPITAL_COMMUNITY): Payer: Self-pay | Admitting: Cardiovascular Disease

## 2016-12-21 DIAGNOSIS — I70213 Atherosclerosis of native arteries of extremities with intermittent claudication, bilateral legs: Secondary | ICD-10-CM | POA: Diagnosis not present

## 2016-12-21 DIAGNOSIS — I6523 Occlusion and stenosis of bilateral carotid arteries: Secondary | ICD-10-CM | POA: Insufficient documentation

## 2016-12-21 DIAGNOSIS — I10 Essential (primary) hypertension: Secondary | ICD-10-CM | POA: Diagnosis not present

## 2016-12-21 DIAGNOSIS — Z7902 Long term (current) use of antithrombotics/antiplatelets: Secondary | ICD-10-CM | POA: Insufficient documentation

## 2016-12-21 DIAGNOSIS — E785 Hyperlipidemia, unspecified: Secondary | ICD-10-CM | POA: Diagnosis not present

## 2016-12-21 DIAGNOSIS — T82856A Stenosis of peripheral vascular stent, initial encounter: Secondary | ICD-10-CM | POA: Diagnosis not present

## 2016-12-21 DIAGNOSIS — Z7982 Long term (current) use of aspirin: Secondary | ICD-10-CM | POA: Insufficient documentation

## 2016-12-21 DIAGNOSIS — Z87891 Personal history of nicotine dependence: Secondary | ICD-10-CM | POA: Diagnosis not present

## 2016-12-21 DIAGNOSIS — Y831 Surgical operation with implant of artificial internal device as the cause of abnormal reaction of the patient, or of later complication, without mention of misadventure at the time of the procedure: Secondary | ICD-10-CM | POA: Insufficient documentation

## 2016-12-21 DIAGNOSIS — I70211 Atherosclerosis of native arteries of extremities with intermittent claudication, right leg: Secondary | ICD-10-CM | POA: Diagnosis not present

## 2016-12-21 DIAGNOSIS — I739 Peripheral vascular disease, unspecified: Secondary | ICD-10-CM

## 2016-12-21 HISTORY — PX: PERIPHERAL VASCULAR INTERVENTION: CATH118257

## 2016-12-21 HISTORY — DX: Type 2 diabetes mellitus without complications: E11.9

## 2016-12-21 HISTORY — PX: LOWER EXTREMITY ANGIOGRAPHY: CATH118251

## 2016-12-21 LAB — GLUCOSE, CAPILLARY
GLUCOSE-CAPILLARY: 145 mg/dL — AB (ref 65–99)
Glucose-Capillary: 105 mg/dL — ABNORMAL HIGH (ref 65–99)

## 2016-12-21 LAB — POCT ACTIVATED CLOTTING TIME
Activated Clotting Time: 219 seconds
Activated Clotting Time: 246 seconds
Activated Clotting Time: 180 seconds

## 2016-12-21 SURGERY — LOWER EXTREMITY ANGIOGRAPHY
Anesthesia: LOCAL

## 2016-12-21 MED ORDER — HEPARIN (PORCINE) IN NACL 2-0.9 UNIT/ML-% IJ SOLN
INTRAMUSCULAR | Status: AC
  Filled 2016-12-21: qty 500

## 2016-12-21 MED ORDER — HEPARIN SODIUM (PORCINE) 1000 UNIT/ML IJ SOLN
INTRAMUSCULAR | Status: AC
  Filled 2016-12-21: qty 1

## 2016-12-21 MED ORDER — SODIUM CHLORIDE 0.9 % WEIGHT BASED INFUSION: 1.0000 mL/kg/h | INTRAVENOUS | Status: DC

## 2016-12-21 MED ORDER — SODIUM CHLORIDE 0.9 % IV SOLN
INTRAVENOUS | Status: DC
  Administered 2016-12-21: 10:00:00 via INTRAVENOUS

## 2016-12-21 MED ORDER — ACETAMINOPHEN 325 MG PO TABS: 650.0000 mg | ORAL_TABLET | ORAL | Status: DC | PRN

## 2016-12-21 MED ORDER — DILTIAZEM HCL ER COATED BEADS 120 MG PO CP24
120.0000 mg | ORAL_CAPSULE | Freq: Every day | ORAL | Status: DC
  Administered 2016-12-22: 120 mg via ORAL
  Filled 2016-12-21 (×2): qty 1

## 2016-12-21 MED ORDER — CLOPIDOGREL BISULFATE 75 MG PO TABS
75.0000 mg | ORAL_TABLET | Freq: Every day | ORAL | Status: DC
  Administered 2016-12-22: 75 mg via ORAL
  Filled 2016-12-21: qty 1

## 2016-12-21 MED ORDER — FENTANYL CITRATE (PF) 100 MCG/2ML IJ SOLN
INTRAMUSCULAR | Status: DC | PRN
  Administered 2016-12-21: 25 ug via INTRAVENOUS

## 2016-12-21 MED ORDER — LISINOPRIL 5 MG PO TABS
5.0000 mg | ORAL_TABLET | Freq: Every day | ORAL | Status: DC
  Administered 2016-12-22: 5 mg via ORAL
  Filled 2016-12-21 (×2): qty 1

## 2016-12-21 MED ORDER — HEPARIN (PORCINE) IN NACL 2-0.9 UNIT/ML-% IJ SOLN
INTRAMUSCULAR | Status: AC | PRN
  Administered 2016-12-21: 1000 mL via INTRA_ARTERIAL

## 2016-12-21 MED ORDER — CLOPIDOGREL BISULFATE 75 MG PO TABS
75.0000 mg | ORAL_TABLET | Freq: Every day | ORAL | Status: DC
  Filled 2016-12-21: qty 1

## 2016-12-21 MED ORDER — HYDRALAZINE HCL 20 MG/ML IJ SOLN: 5.0000 mg | INTRAMUSCULAR | Status: DC | PRN

## 2016-12-21 MED ORDER — SODIUM CHLORIDE 0.9% FLUSH: 3.0000 mL | INTRAVENOUS | Status: DC | PRN

## 2016-12-21 MED ORDER — FENTANYL CITRATE (PF) 100 MCG/2ML IJ SOLN
INTRAMUSCULAR | Status: AC
  Filled 2016-12-21: qty 2

## 2016-12-21 MED ORDER — GABAPENTIN 300 MG PO CAPS
300.0000 mg | ORAL_CAPSULE | Freq: Every day | ORAL | Status: DC
  Administered 2016-12-21: 22:00:00 300 mg via ORAL
  Filled 2016-12-21: qty 1

## 2016-12-21 MED ORDER — SODIUM CHLORIDE 0.9% FLUSH
3.0000 mL | Freq: Two times a day (BID) | INTRAVENOUS | Status: DC
  Administered 2016-12-21 – 2016-12-22 (×2): 3 mL via INTRAVENOUS

## 2016-12-21 MED ORDER — ANGIOPLASTY BOOK
Freq: Once | Status: AC
Start: 2016-12-21 — End: 2016-12-21
  Administered 2016-12-21: 22:00:00
  Filled 2016-12-21: qty 1

## 2016-12-21 MED ORDER — SODIUM CHLORIDE 0.9 % WEIGHT BASED INFUSION
3.0000 mL/kg/h | INTRAVENOUS | Status: DC
  Administered 2016-12-21: 3 mL/kg/h via INTRAVENOUS

## 2016-12-21 MED ORDER — MIDAZOLAM HCL 2 MG/2ML IJ SOLN
INTRAMUSCULAR | Status: DC | PRN
  Administered 2016-12-21: 1 mg via INTRAVENOUS

## 2016-12-21 MED ORDER — MIDAZOLAM HCL 2 MG/2ML IJ SOLN
INTRAMUSCULAR | Status: AC
  Filled 2016-12-21: qty 2

## 2016-12-21 MED ORDER — ASPIRIN EC 81 MG PO TBEC
81.0000 mg | DELAYED_RELEASE_TABLET | Freq: Every day | ORAL | Status: DC
  Administered 2016-12-22: 11:00:00 81 mg via ORAL
  Filled 2016-12-21: qty 1

## 2016-12-21 MED ORDER — SODIUM CHLORIDE 0.9 % IV SOLN: 250.0000 mL | INTRAVENOUS | Status: DC | PRN

## 2016-12-21 MED ORDER — ASPIRIN 81 MG PO CHEW: 81.0000 mg | CHEWABLE_TABLET | ORAL | Status: DC

## 2016-12-21 MED ORDER — MORPHINE SULFATE (PF) 4 MG/ML IV SOLN
2.0000 mg | INTRAVENOUS | Status: DC | PRN
  Administered 2016-12-21: 2 mg via INTRAVENOUS
  Filled 2016-12-21: qty 1

## 2016-12-21 MED ORDER — ONDANSETRON HCL 4 MG/2ML IJ SOLN: 4.0000 mg | Freq: Four times a day (QID) | INTRAMUSCULAR | Status: DC | PRN

## 2016-12-21 MED ORDER — HEPARIN SODIUM (PORCINE) 1000 UNIT/ML IJ SOLN
INTRAMUSCULAR | Status: DC | PRN
  Administered 2016-12-21: 8000 [IU] via INTRAVENOUS
  Administered 2016-12-21: 2500 [IU] via INTRAVENOUS

## 2016-12-21 MED ORDER — MORPHINE SULFATE (PF) 10 MG/ML IV SOLN
2.0000 mg | INTRAVENOUS | Status: DC | PRN
Start: 2016-12-21 — End: 2016-12-21

## 2016-12-21 MED ORDER — ATORVASTATIN CALCIUM 40 MG PO TABS
40.0000 mg | ORAL_TABLET | Freq: Every evening | ORAL | Status: DC
  Administered 2016-12-21: 22:00:00 40 mg via ORAL
  Filled 2016-12-21: qty 1

## 2016-12-21 MED ORDER — LABETALOL HCL 5 MG/ML IV SOLN: 10.0000 mg | INTRAVENOUS | Status: DC | PRN

## 2016-12-21 MED ORDER — TRAMADOL HCL 50 MG PO TABS: 50.0000 mg | ORAL_TABLET | Freq: Four times a day (QID) | ORAL | Status: DC | PRN

## 2016-12-21 MED ORDER — LIDOCAINE HCL (PF) 1 % IJ SOLN
INTRAMUSCULAR | Status: DC | PRN
  Administered 2016-12-21: 30 mL via INTRADERMAL

## 2016-12-21 MED ORDER — ASPIRIN EC 81 MG PO TBEC
81.0000 mg | DELAYED_RELEASE_TABLET | Freq: Every day | ORAL | Status: DC
  Filled 2016-12-21: qty 1

## 2016-12-21 MED ORDER — LIDOCAINE HCL (PF) 1 % IJ SOLN
INTRAMUSCULAR | Status: AC
  Filled 2016-12-21: qty 30

## 2016-12-21 SURGICAL SUPPLY — 24 items
BALLN IN.PACT DCB 5X120 (BALLOONS) ×3
BALLN STERLING OTW 5X150X150 (BALLOONS) ×3
BALLOON STERLING OTW 5X150X150 (BALLOONS) ×2 IMPLANT
CATH ANGIO 5F PIGTAIL 65CM (CATHETERS) ×3 IMPLANT
CATH CROSS OVER TEMPO 5F (CATHETERS) ×3 IMPLANT
COVER PRB 48X5XTLSCP FOLD TPE (BAG) ×2 IMPLANT
COVER PROBE 5X48 (BAG) ×1
DCB IN.PACT 5X120 (BALLOONS) ×2 IMPLANT
DEVICE CONTINUOUS FLUSH (MISCELLANEOUS) ×3 IMPLANT
GUIDEWIRE ANGLED .035X150CM (WIRE) ×3 IMPLANT
KIT PV (KITS) ×3 IMPLANT
SHEATH HIGHFLEX ANSEL 7FR 55CM (SHEATH) ×3 IMPLANT
SHEATH PINNACLE 5F 10CM (SHEATH) ×3 IMPLANT
SHEATH PINNACLE 7F 10CM (SHEATH) ×3 IMPLANT
SHEATH PINNACLE MP 7F 45CM (SHEATH) ×3 IMPLANT
STOPCOCK MORSE 400PSI 3WAY (MISCELLANEOUS) ×3 IMPLANT
SYRINGE MEDRAD AVANTA MACH 7 (SYRINGE) ×3 IMPLANT
TRANSDUCER W/STOPCOCK (MISCELLANEOUS) ×3 IMPLANT
TRAY PV CATH (CUSTOM PROCEDURE TRAY) ×3 IMPLANT
TUBING CIL FLEX 10 FLL-RA (TUBING) ×3 IMPLANT
WIRE AMPLATZ SS-J .035X180CM (WIRE) ×3 IMPLANT
WIRE HITORQ VERSACORE ST 145CM (WIRE) ×3 IMPLANT
WIRE ROSEN-J .035X180CM (WIRE) ×3 IMPLANT
WIRE SPARTACORE .014X300CM (WIRE) ×3 IMPLANT

## 2016-12-21 NOTE — Care Management Note (Signed)
Case Management Note  Patient Details  Name: Anthony LeschLarry D Brock MRN: [id number] Date of Birth: 06/26/1955  Subjective/Objective:    From home, s/p pv intervention, will be on plavix, for dc, no needs.                 Action/Plan:   Expected Discharge Date:  12/22/16               Expected Discharge Plan:  Home/Self Care  In-House Referral:     Discharge planning Services  CM Consult  Post Acute Care Choice:    Choice offered to:     DME Arranged:    DME Agency:     HH Arranged:    HH Agency:     Status of Service:  Completed, signed off  If discussed at [employer] Length of Stay Meetings, dates discussed:    Additional Comments:  Leone Havenaylor, Naylee Frankowski [place], RN 12/21/2016, 2:31 PM

## 2016-12-21 NOTE — H&P (View-Only) (Signed)
12/01/2016 Anthony Brock   01/31/1956  [id number]  Primary Physician Darron Doom Anthony Munroe, MD Primary Cardiologist: Anthony Harp MD Anthony Brock, [place]  HPI:  Anthony Brock is a 61 y.o. male married African-American veteran who works at [employer]. He was referred by Dr. Melony Overly at [facility] for peripheral vascular evaluation because of lifestyle limiting claudication.I last saw him in the office 03/30/16. He had carotid stenting by myself 07/17/14.Anthony Brock He has a long history of tobacco abuse smoking three-quarter pack a day for last 30 years having recently stopped smoking after his last peripheral vascular procedure.. He has never had a heart attack or stroke, and denies chest pain or shortness of breath. He states that he has had left greater than right lower extremity claudication which is lifestyle limiting for several years. Recent Dopplers performed in our office 03/20/14 revealed a right ABI 0.58 with an occluded distal right SFA and a left ABI 0.41 with a high-frequency signal in his left SFA. He underwent angiography and intervention on January 18 revealing a 80% Left internal carotid artery stenosis, and short segment occlusions in both SFAs. I was able to perform directional atherectomy on his left SFA followed by drug eluding balloon angioplasty. He had an excellent angiographic and clinical result. He ultimately underwent staged right SFA intervention with excellent angiographic and clinical result. He underwent elective left internal carotid artery stenting for high-grade (80%) asymptomatic left internal carotid artery stenosis 07/17/14. His recent carotid Dopplers performed 02/07/15 reveals to be widely patent. He does have recurrent claudication with Dopplers performed 02/07/15 revealing high-grade distal bilateral SFA restenosis. He underwent right SFA directional arthrectomy and drug-eluting balloon angioplasty on 02/15/15 with excellent angiographic and clinical result. He  underwent staged left SFA intervention by myself using directional atherectomy and drug-eluting balloon angioplasty on 02/25/15. Dopplers performed 2 weeks later that showed ABIs of 0.9 bilaterally. Recentlower extremity Dopplers performed 01/22/16 revealed recurrent bilateral SFA stenosis with slightly lower ABIs. He does complain of some mild claudication. Carotid Dopplers performed 01/24/16 revealed a widely patent left internal carotid artery stent. Because of these findings I decided to proceed with PTA and drug eluding stenting using Zilver PTX of both SFAs. I intervened on the left SFA on December 11. He underwent right SFA intervention 04/20/16. Follow-up lower extremity arterial Doppler studies performed 05/06/16 revealed widely patent stents with normal ABIs. His claudication has resolved. Unfortunately over the last month or 2 his claudication has really heard and his most recent Dopplers performed 10/22/16 revealed a decline in his right ABI 0.94 and his left ABI 0.82 with high-frequency signals in the mid right SFA and proximal left SFA.    Current Meds  Medication Sig  . aspirin EC 81 MG tablet Take 1 tablet (81 mg total) by mouth daily.  Anthony Brock atorvastatin (LIPITOR) 40 MG tablet TAKE 1 TABLET BY MOUTH EVERY EVENING  . Blood Glucose Monitoring Suppl (ONE TOUCH ULTRA MINI) w/Device KIT   . cetirizine (ZYRTEC) 10 MG tablet Take 10 mg by mouth daily as needed for allergies.   Anthony Brock clopidogrel (PLAVIX) 75 MG tablet TAKE 1 TABLET BY MOUTH DAILY  . diltiazem (CARDIZEM CD) 120 MG 24 hr capsule TAKE 1 CAPSULE BY MOUTH DAILY  . gabapentin (NEURONTIN) 300 MG capsule Take 300 mg by mouth every evening.  Anthony Brock JANUMET 50-500 MG tablet Take 1 tablet by mouth daily before breakfast.   . latanoprost (XALATAN) 0.005 % ophthalmic solution Place 1 drop into both eyes  as directed.  Anthony Brock lisinopril (PRINIVIL,ZESTRIL) 5 MG tablet TAKE ONE (1) TABLET BY MOUTH EVERY DAY (Patient taking differently: TAKE 5 MG DAILY IN THE  MORNING)  . LUZU 1 % CREA Apply 1 application topically daily as needed (athletes foot).   . methocarbamol (ROBAXIN) 750 MG tablet Take 1 tablet (750 mg total) by mouth 4 (four) times daily as needed (use for muscle cramps/pain).  . ONE TOUCH ULTRA TEST test strip   . ONETOUCH DELICA LANCETS 08M MISC   . traMADol (ULTRAM) 50 MG tablet Take 1-2 tablets (50-100 mg total) by mouth every 6 (six) hours as needed for moderate pain or severe pain.  Anthony Brock VIAGRA 100 MG tablet TAKE 1 TABLET BY MOUTH DAILY AS NEEDED FOR ERECTILE DYSFUNCTION     No Known Allergies  Social History   Social History  . Marital status: Married    Spouse name: Anthony Brock  . Number of children: 2  . Years of education: 15   Occupational History  . [employer], Materials management    Social History Main Topics  . Smoking status: Former Smoker    Packs/day: 0.50    Years: 45.00    Types: Cigarettes    Quit date: 03/01/2015  . Smokeless tobacco: Never Used     Comment: 02/25/2015 "stopped smoking 02/14/2015"  . Alcohol use 0.0 oz/week     Comment: 02/25/2015 last drink was summer 2016; I've pretty much quit"  . Drug use: Yes     Comment: "might have used some drugs in my 20's"  . Sexual activity: Yes   Other Topics Concern  . Not on file   Social History Narrative   Lives with wife   caffeine- coffee, 1-2 cups daily     Review of Systems: General: negative for chills, fever, night sweats or weight changes.  Cardiovascular: negative for chest pain, dyspnea on exertion, edema, orthopnea, palpitations, paroxysmal nocturnal dyspnea or shortness of breath Dermatological: negative for rash Respiratory: negative for cough or wheezing Urologic: negative for hematuria Abdominal: negative for nausea, vomiting, diarrhea, bright red blood per rectum, melena, or hematemesis Neurologic: negative for visual changes, syncope, or dizziness All other systems reviewed and are otherwise negative except as noted  above.    Blood pressure 130/68, pulse (!) 54, height _0  (1.854 m), weight 186 lb 12.8 oz (84.7 kg).  General appearance: alert and no distress Neck: no adenopathy, no carotid bruit, no JVD, supple, symmetrical, trachea midline and thyroid not enlarged, symmetric, no tenderness/mass/nodules Lungs: clear to auscultation bilaterally Heart: regular rate and rhythm, S1, S2 normal, no murmur, click, rub or gallop Extremities: extremities normal, atraumatic, no cyanosis or edema  EKG sinus bradycardia at 54 with left axis deviation and nonspecific ST and T-wave changes. I personally reviewed this EKG.  ASSESSMENT AND PLAN:   Essential hypertension History of essential hypertension blood pressure measured today at 130/68. He is on diltiazem and lisinopril. Continue current meds at current dosing  Lt ICA stenosis- Carotid stent placed 07/17/14 History of carotid artery disease status post left carotid stent placed by myself 07/17/14. His left carotid Dopplers performed 01/22/16 revealed a widely patent left carotid stent with only mild right ICA stenosis.  PAD (peripheral artery disease) (HCC) History of peripheral arterial disease status post left and right SFA intervention in December of last year in January of this year with excellent angiographic clinical and Doppler results. Unfortunately his most recent lower extremity Dopplers show decreased ABIs, increased frequency in his mid right and  proximal left SFA with recurrent claudication. I am going to arrange for him to undergo repeat angiography and intervention.  Hyperlipidemia with target LDL less than 70 History of hyperlipidemia on statin therapy with recent lipid profile performed 05/25/16 revealing total cholesterol 121, LDL 50 and HDL of 42.      Anthony Harp MD FACP,FACC,FAHA, [facility] 12/01/2016 11:45 AM

## 2016-12-21 NOTE — Discharge Summary (Signed)
Discharge Summary    Patient ID: [name],  MRN: [id number], DOB/AGE: 62-Jun-1957 62 y.o.  Admit date: 12/21/2016 Discharge date: 12/22/2016  Primary Care Provider: Hayden Rasmussen Primary Cardiologist: Gwenlyn Found   Discharge Diagnoses    Active Problems:   PAD (peripheral artery disease) ([place])   Claudication in peripheral vascular disease ([place])   Allergies No Known Allergies  Diagnostic Studies/Procedures    PV angiogram: 12/21/16  Angiographic Data:   1: Abdominal aortogram-distal dominant aorta was free of significant atherosclerotic changes 2: Right lower extremity-there was 95% "in-stent restenosis within the previously placed Zilver stent in the mid right SFA with 3 vessel runoff 3: Left lower extremity-there is a 65% ostial left SFA stenosis followed by 95% tandem lesions within the previously placed left SFA Zilver stent with 3 vessel runoff  Final Impression: Successful drug-eluting balloon angioplasty of right SFA "in-stent restenosis of the previously placed Zilver drug-eluting stent approximately 9 months ago. The patient does have residual left SFA "in-stent restenosis. The sheath will be removed and pressure held once the ACT falls below 170. Patient will be gently hydrated overnight and discharged home in the morning. I will see him back in the office next week at which time we will discuss staged left SFA intervention. He left the lab in stable condition.  Quay Burow. MD, [facility] _____________   History of Present Illness     Anthony Brock is a 61 y.o. male married African-American veteran who works at [employer]. He was referred by Dr. Melony Overly at [facility] for peripheral vascular evaluation because of lifestyle limiting claudication. He had carotid stenting by Dr. Gwenlyn Found 07/17/14. He has a long history of tobacco abuse smoking three-quarter pack a day for last 30 years having recently stopped smoking after his last peripheral vascular procedure. He has  never had a heart attack or stroke, and denies chest pain or shortness of breath. He stated that he has had left greater than right lower extremity claudication which is lifestyle limiting for several years. Dopplers performed in our office 03/20/14 revealed a right ABI 0.58 with an occluded distal right SFA and a left ABI 0.41 with a high-frequency signal in his left SFA. He underwent angiography and intervention on January 18 revealing a 80% Left internal carotid artery stenosis, and short segment occlusions in both SFAs with directional atherectomy on his left SFA followed by drug eluding balloon angioplasty. He had an excellent angiographic and clinical result. He ultimately underwent staged right SFA intervention with excellent angiographic and clinical result. He underwent elective left internal carotid artery stenting for high-grade (80%) asymptomatic left internal carotid artery stenosis 07/17/14. His carotid Dopplers performed 02/07/15 reveals to be widely patent. Had recurrent claudication with Dopplers performed 02/07/15 revealing high-grade distal bilateral SFA restenosis. He underwent right SFA directional arthrectomy and drug-eluting balloon angioplasty on 02/15/15 with excellent angiographic and clinical result. He underwent staged left SFA intervention by myself using directional atherectomy and drug-eluting balloon angioplasty on 02/25/15. Dopplers performed 2 weeks later that showed ABIs of 0.9 bilaterally. Dopplers performed 01/22/16 revealed recurrent bilateral SFA stenosis with slightly lower ABIs. Carotid Dopplers performed 01/24/16 revealed a widely patent left internal carotid artery stent. Underwent PTA and drug eluding stenting using Zilver PTX of both SFAs and intervention on the left SFA on December 11. He underwent right SFA intervention 04/20/16. Follow-up lower extremity arterial Doppler studies performed 05/06/16 revealed widely patent stents with normal ABIs. Unfortunately over the last  month or 2  his claudication has really heard and his most recent Dopplers performed 10/22/16 revealed a decline in his right ABI 0.94 and his left ABI 0.82 with high-frequency signals in the mid right SFA and proximal left SFA. Given this finding he was referred for outpatient PV angiogram.   Hospital Course     Underwent PV angiogram with Dr. Gwenlyn Found noted above with successful drug-eluting balloon angioplasty of right SFA in-stent restenosis of the previously placed Zilver drug-eluting stent. Will be continued on ASA/plavix. Hydrated overnight post cath with morning labs showing Cr 1.21 and Hgb 11.5. No complications noted. Ambulated without difficulty.   General: Well developed, well nourished, male appearing in no acute distress. Head: Normocephalic, atraumatic.  Neck: Supple  Lungs:  Resp regular and unlabored, CTA. Heart: RRR, S1, S2, no S3, S4, or murmur; no rub. Abdomen: Soft, non-tender Extremities: No clubbing, cyanosis,  No edema. Left groin site clear. Right DP pulse 2+, left DP palp but diminished Neuro: Alert and oriented X 3. Moves all extremities spontaneously. Psych: Normal affect.  Anthony Brock was seen by Dr. Burt Knack and determined stable for discharge home. Follow up in the office has been arranged. Medications are listed below.   _____________  Discharge Vitals Blood pressure (!) 154/82, pulse 67, temperature 98.1 F (36.7 C), temperature source Oral, resp. rate 16, height _0  (1.854 m), weight 185 lb 3 oz (84 kg), SpO2 99 %.  Filed Weights   12/21/16 0547 12/22/16 0610  Weight: 188 lb (85.3 kg) 185 lb 3 oz (84 kg)    Labs & Radiologic Studies    CBC  Recent Labs  12/22/16 0135  WBC 4.6  HGB 11.5*  HCT 34.4*  MCV 85.1  PLT 914   Basic Metabolic Panel  Recent Labs  12/22/16 0135  NA 135  K 4.2  CL 102  CO2 27  GLUCOSE 115*  BUN 12  CREATININE 1.21  CALCIUM 9.0   Liver Function Tests No results for input(s): AST, ALT, ALKPHOS, BILITOT, PROT,  ALBUMIN in the last 72 hours. No results for input(s): LIPASE, AMYLASE in the last 72 hours. Cardiac Enzymes No results for input(s): CKTOTAL, CKMB, CKMBINDEX, TROPONINI in the last 72 hours. BNP Invalid input(s): POCBNP D-Dimer No results for input(s): DDIMER in the last 72 hours. Hemoglobin A1C No results for input(s): HGBA1C in the last 72 hours. Fasting Lipid Panel No results for input(s): CHOL, HDL, LDLCALC, TRIG, CHOLHDL, LDLDIRECT in the last 72 hours. Thyroid Function Tests No results for input(s): TSH, T4TOTAL, T3FREE, THYROIDAB in the last 72 hours.  Invalid input(s): FREET3 _____________  No results found. Disposition   Pt is being discharged home today in good condition.  Follow-up Plans & Appointments    Follow-up Information    [facility] Follow up on 01/04/2017.   Specialty:  Cardiology Why:  at 10am for your follow up dopplers. The office will call you with a follow up appt with Dr. Gwenlyn Found.  Contact information: [address] [place] [phone]         Discharge Instructions    Call MD for:  redness, tenderness, or signs of infection (pain, swelling, redness, odor or green/yellow discharge around incision site)    Complete by:  As directed    Diet - low sodium heart healthy    Complete by:  As directed    Discharge instructions    Complete by:  As directed    Groin Site Care Refer to this  sheet in the next few weeks. These instructions provide you with information on caring for yourself after your procedure. Your caregiver may also give you more specific instructions. Your treatment has been planned according to current medical practices, but problems sometimes occur. Call your caregiver if you have any problems or questions after your procedure. HOME CARE INSTRUCTIONS You may shower 24 hours after the procedure. Remove the bandage (dressing) and gently wash the site with plain soap and water. Gently  pat the site dry.  Do not apply powder or lotion to the site.  Do not sit in a bathtub, swimming pool, or whirlpool for 5 to 7 days.  No bending, squatting, or lifting anything over 10 pounds (4.5 kg) as directed by your caregiver.  Inspect the site at least twice daily.  Do not drive home if you are discharged the same day of the procedure. Have someone else drive you.  You may drive 24 hours after the procedure unless otherwise instructed by your caregiver.  What to expect: Any bruising will usually fade within 1 to 2 weeks.  Blood that collects in the tissue (hematoma) may be painful to the touch. It should usually decrease in size and tenderness within 1 to 2 weeks.  SEEK IMMEDIATE MEDICAL CARE IF: You have unusual pain at the groin site or down the affected leg.  You have redness, warmth, swelling, or pain at the groin site.  You have drainage (other than a small amount of blood on the dressing).  You have chills.  You have a fever or persistent symptoms for more than 72 hours.  You have a fever and your symptoms suddenly get worse.  Your leg becomes pale, cool, tingly, or numb.  You have heavy bleeding from the site. Hold pressure on the site. .   Increase activity slowly    Complete by:  As directed       Discharge Medications     Medication List    TAKE these medications   aspirin EC 81 MG tablet Take 1 tablet (81 mg total) by mouth daily.   atorvastatin 40 MG tablet Commonly known as:  LIPITOR TAKE 1 TABLET BY MOUTH EVERY EVENING   cetirizine 10 MG tablet Commonly known as:  ZYRTEC Take 10 mg by mouth daily as needed for allergies.   clopidogrel 75 MG tablet Commonly known as:  PLAVIX TAKE 1 TABLET BY MOUTH DAILY   diltiazem 120 MG 24 hr capsule Commonly known as:  CARDIZEM CD TAKE 1 CAPSULE BY MOUTH DAILY   gabapentin 300 MG capsule Commonly known as:  NEURONTIN Take 300 mg by mouth at bedtime.   JANUMET 50-500 MG tablet Generic drug:   sitaGLIPtin-metformin Take 1 tablet by mouth 2 (two) times daily.   latanoprost 0.005 % ophthalmic solution Commonly known as:  XALATAN Place 1 drop into both eyes at bedtime.   lisinopril 5 MG tablet Commonly known as:  PRINIVIL,ZESTRIL TAKE ONE (1) TABLET BY MOUTH EVERY DAY What changed:  See the new instructions.   LUMIFY OP Apply 1 drop to eye daily as needed (redness).   methocarbamol 750 MG tablet Commonly known as:  ROBAXIN Take 1 tablet (750 mg total) by mouth 4 (four) times daily as needed (use for muscle cramps/pain).   ONE TOUCH ULTRA MINI w/Device Kit   ONE TOUCH ULTRA TEST test strip Generic drug:  glucose blood   ONETOUCH DELICA LANCETS 39Q Misc   traMADol 50 MG tablet Commonly known as:  ULTRAM Take  1-2 tablets (50-100 mg total) by mouth every 6 (six) hours as needed for moderate pain or severe pain.   VIAGRA 100 MG tablet Generic drug:  sildenafil TAKE 1 TABLET BY MOUTH DAILY AS NEEDED FOR ERECTILE DYSFUNCTION         Outstanding Labs/Studies   F/u dopplers  Duration of Discharge Encounter   Greater than 30 minutes including physician time.  Signed, Reino Bellis NP-C 12/22/2016, 10:09 AM   Patient seen, examined. Available data reviewed. Agree with findings, assessment, and plan as outlined by Reino Bellis, NP. Exam findings reflect my exam this morning. Pt stable after PTA of the right SFA via left femoral access without complication. Pt to FU with Dr Gwenlyn Found next week and be set up for intervention of severe ISR of the right SFA. Discussed importance f DAPT with ASA and plavix.  Sherren Mocha, M.D. 12/22/2016 10:09 AM

## 2016-12-21 NOTE — Interval H&P Note (Signed)
History and Physical Interval Note:  12/21/2016 8:08 AM  Anthony Brock  has presented today for surgery, with the diagnosis of pvd  The various methods of treatment have been discussed with the patient and family. After consideration of risks, benefits and other options for treatment, the patient has consented to  Procedure(s): Lower Extremity Angiography (N/A) as a surgical intervention .  The patient's history has been reviewed, patient examined, no change in status, stable for surgery.  I have reviewed the patient's chart and labs.  Questions were answered to the patient's satisfaction.     [name], Brendan Gadson

## 2016-12-22 DIAGNOSIS — I70213 Atherosclerosis of native arteries of extremities with intermittent claudication, bilateral legs: Secondary | ICD-10-CM | POA: Diagnosis not present

## 2016-12-22 DIAGNOSIS — I739 Peripheral vascular disease, unspecified: Secondary | ICD-10-CM

## 2016-12-22 LAB — BASIC METABOLIC PANEL
ANION GAP: 6 (ref 5–15)
BUN: 12 mg/dL (ref 6–20)
CHLORIDE: 102 mmol/L (ref 101–111)
CO2: 27 mmol/L (ref 22–32)
Calcium: 9 mg/dL (ref 8.9–10.3)
Creatinine, Ser: 1.21 mg/dL (ref 0.61–1.24)
GFR calc non Af Amer: >60 mL/min (ref >60)
GLUCOSE: 115 mg/dL — AB (ref 65–99)
Potassium: 4.2 mmol/L (ref 3.5–5.1)
Sodium: 135 mmol/L (ref 135–145)

## 2016-12-22 LAB — CBC
HCT: 34.4 % — ABNORMAL LOW (ref 39.0–52.0)
HEMOGLOBIN: 11.5 g/dL — AB (ref 13.0–17.0)
MCH: 28.5 pg (ref 26.0–34.0)
MCHC: 33.4 g/dL (ref 30.0–36.0)
MCV: 85.1 fL (ref 78.0–100.0)
Platelets: 175 10*3/uL (ref 150–400)
RBC: 4.04 MIL/uL — AB (ref 4.22–5.81)
RDW: 12.2 % (ref 11.5–15.5)
WBC: 4.6 10*3/uL (ref 4.0–10.5)

## 2016-12-22 LAB — GLUCOSE, CAPILLARY: Glucose-Capillary: 137 mg/dL — ABNORMAL HIGH (ref 65–99)

## 2016-12-30 ENCOUNTER — Other Ambulatory Visit: Payer: Self-pay | Admitting: Cardiovascular Disease

## 2017-01-04 ENCOUNTER — Ambulatory Visit (HOSPITAL_COMMUNITY)
Admission: RE | Admit: 2017-01-04 | Discharge: 2017-01-04 | Disposition: A | Discharge status: Home / Self Care | Payer: BLUE CROSS/BLUE SHIELD | Source: Ambulatory Visit | Attending: Internal Medicine | Admitting: Internal Medicine

## 2017-01-04 DIAGNOSIS — I739 Peripheral vascular disease, unspecified: Secondary | ICD-10-CM | POA: Diagnosis not present

## 2017-01-05 NOTE — Telephone Encounter (Signed)
Pt came in to request refill of viagra. Please advise if OK to fill. Last rx was for 10 tablets no refills.

## 2017-01-07 ENCOUNTER — Other Ambulatory Visit: Payer: Self-pay

## 2017-01-07 DIAGNOSIS — I739 Peripheral vascular disease, unspecified: Secondary | ICD-10-CM

## 2017-01-08 ENCOUNTER — Ambulatory Visit (INDEPENDENT_AMBULATORY_CARE_PROVIDER_SITE_OTHER): Payer: BLUE CROSS/BLUE SHIELD | Admitting: Cardiovascular Disease

## 2017-01-08 ENCOUNTER — Ambulatory Visit: Payer: BLUE CROSS/BLUE SHIELD | Admitting: Cardiovascular Disease

## 2017-01-08 ENCOUNTER — Encounter: Payer: Self-pay | Admitting: Cardiovascular Disease

## 2017-01-08 VITALS — BP 124/71 | HR 61 | Ht 73.0 in | Wt 187.0 lb

## 2017-01-08 DIAGNOSIS — I739 Peripheral vascular disease, unspecified: Secondary | ICD-10-CM | POA: Diagnosis not present

## 2017-01-08 NOTE — Addendum Note (Signed)
Addended by: Evans Lance on: 01/08/2017 10:52 AM   Modules accepted: Orders

## 2017-01-08 NOTE — Progress Notes (Signed)
01/08/2017 Anthony Brock   [birth year]  [id number]  Primary Physician Darron Doom Anthony Munroe, MD Primary Cardiologist: [name] Anthony Brock, [place]  HPI:  Anthony Brock is a 61 y.o. male married African-American veteran who works at [employer]. He was referred by Dr. Melony Overly at [facility] for peripheral vascular evaluation because of lifestyle limiting claudication.I last saw him in the office 12/01/16. He had carotid stenting by myself 07/17/14.Anthony Brock He has a long history of tobacco abuse smoking three-quarter pack a day for last 30 years having recently stopped smoking after his last peripheral vascular procedure.. He has never had a heart attack or stroke, and denies chest pain or shortness of breath. He states that he has had left greater than right lower extremity claudication which is lifestyle limiting for several years. Recent Dopplers performed in our office 03/20/14 revealed a right ABI 0.58 with an occluded distal right SFA and a left ABI 0.41 with a high-frequency signal in his left SFA. He underwent angiography and intervention on January 18 revealing a 80% Left internal carotid artery stenosis, and short segment occlusions in both SFAs. I was able to perform directional atherectomy on his left SFA followed by drug eluding balloon angioplasty. He had an excellent angiographic and clinical result. He ultimately underwent staged right SFA intervention with excellent angiographic and clinical result. He underwent elective left internal carotid artery stenting for high-grade (80%) asymptomatic left internal carotid artery stenosis 07/17/14. His recent carotid Dopplers performed 02/07/15 reveals to be widely patent. He does have recurrent claudication with Dopplers performed 02/07/15 revealing high-grade distal bilateral SFA restenosis. He underwent right SFA directional arthrectomy and drug-eluting balloon angioplasty on 02/15/15 with excellent angiographic and clinical result. He underwent  staged left SFA intervention by myself using directional atherectomy and drug-eluting balloon angioplasty on 02/25/15. Dopplers performed 2 weeks later that showed ABIs of 0.9 bilaterally. Recentlower extremity Dopplers performed 01/22/16 revealed recurrent bilateral SFA stenosis with slightly lower ABIs. He does complain of some mild claudication. Carotid Dopplers performed 01/24/16 revealed a widely patent left internal carotid artery stent. Because of these findings I decided to proceed with PTA and drug eluding stenting using Zilver PTX of both SFAs. I intervened on the left SFA on December 11. He underwent right SFA intervention 04/20/16. Follow-up lower extremity arterial Doppler studies performed 05/06/16 revealed widely patent stents with normal ABIs. His claudication has resolved. Unfortunately over the last month or 2 his claudication has really heard and his most recent Dopplers performed 10/22/16 revealed a decline in his right ABI 0.94 and his left ABI 0.82 with high-frequency signals in the mid right SFA and proximal left SFA. He underwent peripheral angiography by myself 12/21/16 revealing high-grade bilateral Zilver in-stent restenosis. I performed drug-eluting balloon angioplasty of his right SFA ISR with excellent angiographic result. ABI improved up to 1.1 and his claudication resolved. He presents today for left SFA intervention.    Current Meds  Medication Sig  . aspirin EC 81 MG tablet Take 1 tablet (81 mg total) by mouth daily.  Anthony Brock atorvastatin (LIPITOR) 40 MG tablet TAKE 1 TABLET BY MOUTH EVERY EVENING  . Blood Glucose Monitoring Suppl (ONE TOUCH ULTRA MINI) w/Device KIT   . Brimonidine Tartrate (LUMIFY OP) Apply 1 drop to eye daily as needed (redness).  . cetirizine (ZYRTEC) 10 MG tablet Take 10 mg by mouth daily as needed for allergies.   Anthony Brock clopidogrel (PLAVIX) 75 MG tablet TAKE 1 TABLET BY MOUTH DAILY  .  diltiazem (CARDIZEM CD) 120 MG 24 hr capsule TAKE 1 CAPSULE BY MOUTH DAILY    . gabapentin (NEURONTIN) 300 MG capsule Take 300 mg by mouth at bedtime.   Anthony Brock JANUMET 50-500 MG tablet Take 1 tablet by mouth 2 (two) times daily.   Anthony Brock latanoprost (XALATAN) 0.005 % ophthalmic solution Place 1 drop into both eyes at bedtime.   Anthony Brock lisinopril (PRINIVIL,ZESTRIL) 5 MG tablet TAKE ONE (1) TABLET BY MOUTH EVERY DAY (Patient taking differently: Take 5 mg by mouth twice daily)  . methocarbamol (ROBAXIN) 750 MG tablet Take 1 tablet (750 mg total) by mouth 4 (four) times daily as needed (use for muscle cramps/pain).  . ONE TOUCH ULTRA TEST test strip   . ONETOUCH DELICA LANCETS 06C MISC   . traMADol (ULTRAM) 50 MG tablet Take 1-2 tablets (50-100 mg total) by mouth every 6 (six) hours as needed for moderate pain or severe pain.  Anthony Brock VIAGRA 100 MG tablet TAKE 1 TABLET BY MOUTH DAILY AS NEEDED FOR ERECTILE DYSFUNCTION     No Known Allergies  Social History   Social History  . Marital status: Married    Spouse name: Anthony Brock  . Number of children: 2  . Years of education: 15   Occupational History  . [employer], Materials management    Social History Main Topics  . Smoking status: Former Smoker    Packs/day: 0.50    Years: 45.00    Types: Cigarettes    Quit date: 03/01/2015  . Smokeless tobacco: Never Used     Comment: 02/25/2015 "stopped smoking 02/14/2015"  . Alcohol use 0.0 oz/week     Comment: 12/21/2016 "might have a couple drinks/year"  . Drug use: Yes     Comment: "might have used some drugs in my 20's"  . Sexual activity: Yes   Other Topics Concern  . Not on file   Social History Narrative   Lives with wife   caffeine- coffee, 1-2 cups daily     Review of Systems: General: negative for chills, fever, night sweats or weight changes.  Cardiovascular: negative for chest pain, dyspnea on exertion, edema, orthopnea, palpitations, paroxysmal nocturnal dyspnea or shortness of breath Dermatological: negative for rash Respiratory: negative for cough or wheezing Urologic:  negative for hematuria Abdominal: negative for nausea, vomiting, diarrhea, bright red blood per rectum, melena, or hematemesis Neurologic: negative for visual changes, syncope, or dizziness All other systems reviewed and are otherwise negative except as noted above.    Blood pressure 124/71, pulse 61, height 6' 1"  (1.854 m), weight 187 lb (84.8 kg).  General appearance: alert and no distress Neck: no adenopathy, no carotid bruit, no JVD, supple, symmetrical, trachea midline and thyroid not enlarged, symmetric, no tenderness/mass/nodules Lungs: clear to auscultation bilaterally Heart: regular rate and rhythm, S1, S2 normal, no murmur, click, rub or gallop Extremities: extremities normal, atraumatic, no cyanosis or edema Pulses: 2+ and symmetric Skin: Skin color, texture, turgor normal. No rashes or lesions Neurologic: Alert and oriented X 3, normal strength and tone. Normal symmetric reflexes. Normal coordination and gait  EKG not performed today  ASSESSMENT AND PLAN:   Claudication in peripheral vascular disease [facility]) Mr. Trauger returns for post hospital visit after his recent endovascular procedure performed by myself 12/21/16 with drug eluting balloon angioplasty of his right SFA Zilver in-stent restenosis. His right ABI fully normalized. He does have residual high-grade left Zilver ISR with claudication on that side and he wishes to pursue revascularization which I will schedule next week.  [name] FACP,FACC,FAHA, [facility] 01/08/2017 10:47 AM

## 2017-01-08 NOTE — Patient Instructions (Signed)
   [facility] CARDIOVASCULAR DIVISION [facility] [address] [place] [address] Dept: [phone] Loc: [phone]  Anthony Brock  01/08/2017  You are scheduled for a Peripheral Angiogram on Thursday, October 4 with Dr. Nanetta Batty.  1. Please arrive at the [facility] (Main Entrance A) at [facility]: [address] [place], [place] [address] at 9:30 AM (two hours before your procedure to ensure your preparation). Free valet parking service is available.   Special note: Every effort is made to have your procedure done on time. Please understand that emergencies sometimes delay scheduled procedures.  2. Diet: Do not eat or drink anything after midnight prior to your procedure except sips of water to take medications.  3. Labs: Please have labs drawn today in our office.  4. Medication instructions in preparation for your procedure:  Stop taking, Janumet (Metformin + Sitagliptin) on Wednesday, October 3.    On the morning of your procedure, take your aspirin and Plavix/Clopidogrel and any morning medicines NOT listed above.  You may use sips of water.  5. Plan for one night stay--bring personal belongings. 6. Bring a current list of your medications and current insurance cards. 7. You MUST have a responsible person to drive you home. 8. Someone MUST be with you the first 24 hours after you arrive home or your discharge will be delayed. 9. Please wear clothes that are easy to get on and off and wear slip-on shoes.  Thank you for allowing [place] to care for you!   -- [facility]   Post Angiogram:  Your physician has requested that you have a lower extremity arterial duplex. During this test, ultrasound is used to evaluate arterial blood flow in the legs. Allow one hour for this exam. There are no restrictions or special instructions.  Your physician has requested that you have an  ankle brachial index (ABI). During this test an ultrasound and blood pressure cuff are used to evaluate the arteries that supply the arms and legs with blood. Allow thirty minutes for this exam. There are no restrictions or special instructions.  Your physician recommends that you schedule a follow-up appointment 2 weeks after procedure with Dr. Allyson Sabal.

## 2017-01-08 NOTE — Assessment & Plan Note (Signed)
Mr. Crunkleton returns for post hospital visit after his recent endovascular procedure performed by myself 12/21/16 with drug eluting balloon angioplasty of his right SFA Zilver in-stent restenosis. His right ABI fully normalized. He does have residual high-grade left Zilver ISR with claudication on that side and he wishes to pursue revascularization which I will schedule next week.

## 2017-01-09 LAB — CBC WITH DIFFERENTIAL/PLATELET
Basophils Absolute: 0 10*3/uL (ref 0.0–0.2)
Basos: 0 %
EOS (ABSOLUTE): 0.1 10*3/uL (ref 0.0–0.4)
EOS: 2 %
HEMATOCRIT: 34.8 % — AB (ref 37.5–51.0)
HEMOGLOBIN: 12.1 g/dL — AB (ref 13.0–17.7)
IMMATURE GRANULOCYTES: 0 %
Immature Grans (Abs): 0 10*3/uL (ref 0.0–0.1)
LYMPHS ABS: 1.7 10*3/uL (ref 0.7–3.1)
Lymphs: 46 %
MCH: 29.1 pg (ref 26.6–33.0)
MCHC: 34.8 g/dL (ref 31.5–35.7)
MCV: 84 fL (ref 79–97)
MONOCYTES: 8 %
MONOS ABS: 0.3 10*3/uL (ref 0.1–0.9)
Neutrophils Absolute: 1.6 10*3/uL (ref 1.4–7.0)
Neutrophils: 44 %
Platelets: 221 10*3/uL (ref 150–379)
RBC: 4.16 x10E6/uL (ref 4.14–5.80)
RDW: 12 % — AB (ref 12.3–15.4)
WBC: 3.6 10*3/uL (ref 3.4–10.8)

## 2017-01-09 LAB — BASIC METABOLIC PANEL
BUN / CREAT RATIO: 13 (ref 10–24)
BUN: 13 mg/dL (ref 8–27)
CO2: 23 mmol/L (ref 20–29)
CREATININE: 1.04 mg/dL (ref 0.76–1.27)
Calcium: 9.4 mg/dL (ref 8.6–10.2)
Chloride: 98 mmol/L (ref 96–106)
GFR calc Af Amer: 89 mL/min/{1.73_m2} (ref >59)
GFR, EST NON AFRICAN AMERICAN: 77 mL/min/{1.73_m2} (ref >59)
Glucose: 119 mg/dL — ABNORMAL HIGH (ref 65–99)
Potassium: 4.2 mmol/L (ref 3.5–5.2)
SODIUM: 138 mmol/L (ref 134–144)

## 2017-01-09 LAB — PROTIME-INR
INR: 1 (ref 0.8–1.2)
PROTHROMBIN TIME: 10.8 s (ref 9.1–12.0)

## 2017-01-09 LAB — TSH: TSH: 2.47 u[IU]/mL (ref 0.450–4.500)

## 2017-01-09 LAB — APTT: aPTT: 26 s (ref 24–33)

## 2017-01-11 ENCOUNTER — Ambulatory Visit
Admission: RE | Admit: 2017-01-11 | Discharge: 2017-01-11 | Disposition: A | Discharge status: Home / Self Care | Payer: BLUE CROSS/BLUE SHIELD | Source: Ambulatory Visit | Attending: Cardiovascular Disease | Admitting: Cardiovascular Disease

## 2017-01-11 DIAGNOSIS — I739 Peripheral vascular disease, unspecified: Secondary | ICD-10-CM

## 2017-01-12 ENCOUNTER — Other Ambulatory Visit: Payer: Self-pay | Admitting: Cardiovascular Disease

## 2017-01-12 DIAGNOSIS — I739 Peripheral vascular disease, unspecified: Secondary | ICD-10-CM

## 2017-01-13 ENCOUNTER — Telehealth: Payer: Self-pay | Admitting: Cardiovascular Disease

## 2017-01-13 ENCOUNTER — Ambulatory Visit: Payer: BLUE CROSS/BLUE SHIELD | Admitting: Cardiovascular Disease

## 2017-01-13 NOTE — Telephone Encounter (Signed)
Spoke to pt. He had appt scheduled with Dr. Allyson Sabal on 10/17 and 10/23. Pt has Carotid doppler on 10/10 and s/p angio LEA doppler on 10/18. Asked pt if he would like to cancel 10/17 appt and just come in on 10/23 to go over carotid doppler and follow-up after procedure. Pt agreed to this. Also informed pt of normal CXR and pre-procedure labs. Pt verbalized thanks for the call.

## 2017-01-14 ENCOUNTER — Ambulatory Visit (HOSPITAL_COMMUNITY)
Admission: RE | Disposition: A | Discharge status: Home / Self Care | Payer: Self-pay | Source: Ambulatory Visit | Attending: Cardiovascular Disease

## 2017-01-14 ENCOUNTER — Ambulatory Visit (HOSPITAL_COMMUNITY)
Admission: RE | Admit: 2017-01-14 | Discharge: 2017-01-15 | Disposition: A | Discharge status: Home / Self Care | Payer: BLUE CROSS/BLUE SHIELD | Source: Ambulatory Visit | Attending: Cardiovascular Disease | Admitting: Cardiovascular Disease

## 2017-01-14 ENCOUNTER — Encounter (HOSPITAL_COMMUNITY): Payer: Self-pay | Admitting: Cardiovascular Disease

## 2017-01-14 DIAGNOSIS — I739 Peripheral vascular disease, unspecified: Secondary | ICD-10-CM | POA: Diagnosis present

## 2017-01-14 DIAGNOSIS — Z7982 Long term (current) use of aspirin: Secondary | ICD-10-CM | POA: Diagnosis not present

## 2017-01-14 DIAGNOSIS — Z79899 Other long term (current) drug therapy: Secondary | ICD-10-CM | POA: Diagnosis not present

## 2017-01-14 DIAGNOSIS — Z87891 Personal history of nicotine dependence: Secondary | ICD-10-CM | POA: Diagnosis not present

## 2017-01-14 DIAGNOSIS — Z7984 Long term (current) use of oral hypoglycemic drugs: Secondary | ICD-10-CM | POA: Insufficient documentation

## 2017-01-14 DIAGNOSIS — Z7902 Long term (current) use of antithrombotics/antiplatelets: Secondary | ICD-10-CM | POA: Insufficient documentation

## 2017-01-14 DIAGNOSIS — I70212 Atherosclerosis of native arteries of extremities with intermittent claudication, left leg: Secondary | ICD-10-CM | POA: Insufficient documentation

## 2017-01-14 DIAGNOSIS — E785 Hyperlipidemia, unspecified: Secondary | ICD-10-CM | POA: Diagnosis not present

## 2017-01-14 DIAGNOSIS — I10 Essential (primary) hypertension: Secondary | ICD-10-CM | POA: Diagnosis not present

## 2017-01-14 HISTORY — PX: LOWER EXTREMITY INTERVENTION: CATH118252

## 2017-01-14 LAB — POCT ACTIVATED CLOTTING TIME
Activated Clotting Time: 241 seconds
Activated Clotting Time: 197 seconds

## 2017-01-14 LAB — GLUCOSE, CAPILLARY
GLUCOSE-CAPILLARY: 119 mg/dL — AB (ref 65–99)
GLUCOSE-CAPILLARY: 97 mg/dL (ref 65–99)
Glucose-Capillary: 103 mg/dL — ABNORMAL HIGH (ref 65–99)
Glucose-Capillary: 93 mg/dL (ref 65–99)

## 2017-01-14 SURGERY — LOWER EXTREMITY INTERVENTION
Anesthesia: LOCAL

## 2017-01-14 MED ORDER — CLOPIDOGREL BISULFATE 75 MG PO TABS: 75.0000 mg | ORAL_TABLET | Freq: Every day | ORAL | Status: DC

## 2017-01-14 MED ORDER — FENTANYL CITRATE (PF) 100 MCG/2ML IJ SOLN
INTRAMUSCULAR | Status: DC | PRN
  Administered 2017-01-14: 25 ug via INTRAVENOUS

## 2017-01-14 MED ORDER — CLOPIDOGREL BISULFATE 75 MG PO TABS
75.0000 mg | ORAL_TABLET | Freq: Every day | ORAL | Status: DC
  Administered 2017-01-15: 75 mg via ORAL
  Filled 2017-01-14: qty 1

## 2017-01-14 MED ORDER — LISINOPRIL 5 MG PO TABS
5.0000 mg | ORAL_TABLET | Freq: Every day | ORAL | Status: DC
  Filled 2017-01-14: qty 1

## 2017-01-14 MED ORDER — HEPARIN SODIUM (PORCINE) 1000 UNIT/ML IJ SOLN
INTRAMUSCULAR | Status: AC
  Filled 2017-01-14: qty 1

## 2017-01-14 MED ORDER — LIDOCAINE HCL 2 % IJ SOLN
INTRAMUSCULAR | Status: AC
  Filled 2017-01-14: qty 10

## 2017-01-14 MED ORDER — SODIUM CHLORIDE 0.9 % WEIGHT BASED INFUSION
3.0000 mL/kg/h | INTRAVENOUS | Status: DC
  Administered 2017-01-14: 3 mL/kg/h via INTRAVENOUS

## 2017-01-14 MED ORDER — SODIUM CHLORIDE 0.9 % WEIGHT BASED INFUSION: 1.0000 mL/kg/h | INTRAVENOUS | Status: DC

## 2017-01-14 MED ORDER — HEPARIN (PORCINE) IN NACL 2-0.9 UNIT/ML-% IJ SOLN
INTRAMUSCULAR | Status: AC | PRN
  Administered 2017-01-14: 1000 mL

## 2017-01-14 MED ORDER — SODIUM CHLORIDE 0.9 % IV SOLN: 250.0000 mL | INTRAVENOUS | Status: DC | PRN

## 2017-01-14 MED ORDER — HYDRALAZINE HCL 20 MG/ML IJ SOLN: 5.0000 mg | INTRAMUSCULAR | Status: DC | PRN

## 2017-01-14 MED ORDER — MIDAZOLAM HCL 2 MG/2ML IJ SOLN
INTRAMUSCULAR | Status: DC | PRN
  Administered 2017-01-14: 1 mg via INTRAVENOUS

## 2017-01-14 MED ORDER — FENTANYL CITRATE (PF) 100 MCG/2ML IJ SOLN
INTRAMUSCULAR | Status: AC
  Filled 2017-01-14: qty 2

## 2017-01-14 MED ORDER — SODIUM CHLORIDE 0.9% FLUSH: 3.0000 mL | Freq: Two times a day (BID) | INTRAVENOUS | Status: DC

## 2017-01-14 MED ORDER — LABETALOL HCL 5 MG/ML IV SOLN: 10.0000 mg | INTRAVENOUS | Status: DC | PRN

## 2017-01-14 MED ORDER — ACETAMINOPHEN 325 MG PO TABS: 650.0000 mg | ORAL_TABLET | ORAL | Status: DC | PRN

## 2017-01-14 MED ORDER — SODIUM CHLORIDE 0.9% FLUSH: 3.0000 mL | INTRAVENOUS | Status: DC | PRN

## 2017-01-14 MED ORDER — HEPARIN (PORCINE) IN NACL 2-0.9 UNIT/ML-% IJ SOLN
INTRAMUSCULAR | Status: AC
  Filled 2017-01-14: qty 1000

## 2017-01-14 MED ORDER — IODIXANOL 320 MG/ML IV SOLN
INTRAVENOUS | Status: DC | PRN
  Administered 2017-01-14: 130 mL via INTRA_ARTERIAL

## 2017-01-14 MED ORDER — MIDAZOLAM HCL 2 MG/2ML IJ SOLN
INTRAMUSCULAR | Status: AC
  Filled 2017-01-14: qty 2

## 2017-01-14 MED ORDER — ATORVASTATIN CALCIUM 40 MG PO TABS
40.0000 mg | ORAL_TABLET | Freq: Every evening | ORAL | Status: DC
  Administered 2017-01-14: 40 mg via ORAL
  Filled 2017-01-14: qty 1

## 2017-01-14 MED ORDER — HEPARIN SODIUM (PORCINE) 1000 UNIT/ML IJ SOLN
INTRAMUSCULAR | Status: DC | PRN
  Administered 2017-01-14: 8000 [IU] via INTRAVENOUS

## 2017-01-14 MED ORDER — SODIUM CHLORIDE 0.9 % IV SOLN: INTRAVENOUS | Status: AC

## 2017-01-14 MED ORDER — ASPIRIN 81 MG PO CHEW: 81.0000 mg | CHEWABLE_TABLET | ORAL | Status: DC

## 2017-01-14 MED ORDER — ASPIRIN EC 81 MG PO TBEC: 81.0000 mg | DELAYED_RELEASE_TABLET | Freq: Every day | ORAL | Status: DC

## 2017-01-14 MED ORDER — LIDOCAINE HCL (PF) 1 % IJ SOLN
INTRAMUSCULAR | Status: DC | PRN
  Administered 2017-01-14: 20 mL

## 2017-01-14 MED ORDER — ASPIRIN EC 81 MG PO TBEC
81.0000 mg | DELAYED_RELEASE_TABLET | Freq: Every day | ORAL | Status: DC
  Administered 2017-01-15: 81 mg via ORAL
  Filled 2017-01-14: qty 1

## 2017-01-14 MED ORDER — ONDANSETRON HCL 4 MG/2ML IJ SOLN: 4.0000 mg | Freq: Four times a day (QID) | INTRAMUSCULAR | Status: DC | PRN

## 2017-01-14 MED ORDER — DILTIAZEM HCL ER COATED BEADS 120 MG PO CP24
120.0000 mg | ORAL_CAPSULE | Freq: Every day | ORAL | Status: DC
  Administered 2017-01-14: 120 mg via ORAL
  Filled 2017-01-14 (×2): qty 1

## 2017-01-14 SURGICAL SUPPLY — 24 items
BALLN IN.PACT DCB 5X120 (BALLOONS) ×2
BALLN IN.PACT DCB 5X40 (BALLOONS) ×2
BALLN STERLING OTW 5X100X135 (BALLOONS) ×2
BALLOON STERLING OTW 5X100X135 (BALLOONS) ×1 IMPLANT
CATH CROSS OVER TEMPO 5F (CATHETERS) ×2 IMPLANT
CATH HAWKONE LX EXTENDED TIP (CATHETERS) ×2 IMPLANT
DCB IN.PACT 5X120 (BALLOONS) ×1 IMPLANT
DCB IN.PACT 5X40 (BALLOONS) ×1 IMPLANT
DEVICE CONTINUOUS FLUSH (MISCELLANEOUS) ×2 IMPLANT
GUIDEWIRE ANGLED .035X150CM (WIRE) ×2 IMPLANT
KIT ENCORE 26 ADVANTAGE (KITS) ×2 IMPLANT
KIT PV (KITS) ×2 IMPLANT
SHEATH HIGHFLEX ANSEL 7FR 55CM (SHEATH) ×2 IMPLANT
SHEATH PINNACLE 5F 10CM (SHEATH) ×2 IMPLANT
SHEATH PINNACLE 7F 10CM (SHEATH) ×2 IMPLANT
STOPCOCK MORSE 400PSI 3WAY (MISCELLANEOUS) ×2 IMPLANT
SYRINGE MEDRAD AVANTA MACH 7 (SYRINGE) IMPLANT
TAPE RADIOPAQUE TURBO (MISCELLANEOUS) ×2 IMPLANT
TRANSDUCER W/STOPCOCK (MISCELLANEOUS) ×2 IMPLANT
TRAY PV CATH (CUSTOM PROCEDURE TRAY) ×2 IMPLANT
TUBING CIL FLEX 10 FLL-RA (TUBING) ×2 IMPLANT
WIRE HITORQ VERSACORE ST 145CM (WIRE) ×2 IMPLANT
WIRE ROSEN-J .035X180CM (WIRE) ×2 IMPLANT
WIRE SPARTACORE .014X300CM (WIRE) ×2 IMPLANT

## 2017-01-14 NOTE — Progress Notes (Signed)
Site area: right groin  Site Prior to Removal:  Level 0  Pressure Applied For 20 MINUTES    Minutes Beginning at 1445  Manual:   Yes.    Patient Status During Pull:  stable  Post Pull Groin Site:  Level 0  Post Pull Instructions Given:  Yes.    Post Pull Pulses Present:  Yes.    Dressing Applied:  Yes.    Comments:  Rechecked frequently throughout shift with no change in assessment

## 2017-01-14 NOTE — Interval H&P Note (Signed)
History and Physical Interval Note:  01/14/2017 11:28 AM  Anthony Brock  has presented today for surgery, with the diagnosis of claudication  The various methods of treatment have been discussed with the patient and family. After consideration of risks, benefits and other options for treatment, the patient has consented to  Procedure(s): LOWER EXTREMITY INTERVENTION (N/A) as a surgical intervention .  The patient's history has been reviewed, patient examined, no change in status, stable for surgery.  I have reviewed the patient's chart and labs.  Questions were answered to the patient's satisfaction.     Nanetta Batty

## 2017-01-14 NOTE — H&P (View-Only) (Signed)
01/08/2017 Anthony Brock   Feb 14, 1956  [id number]  Primary Physician Darron Doom Maebelle Munroe, MD Primary Cardiologist: [name] Lupe Carney, [place]  HPI:  Anthony Brock is a 61 y.o. male married African-American veteran who works at [employer]. He was referred by Dr. Melony Overly at [facility] for peripheral vascular evaluation because of lifestyle limiting claudication.I last saw him in the office 12/01/16. He had carotid stenting by myself 07/17/14.[name] He has a long history of tobacco abuse smoking three-quarter pack a day for last 30 years having recently stopped smoking after his last peripheral vascular procedure.. He has never had a heart attack or stroke, and denies chest pain or shortness of breath. He states that he has had left greater than right lower extremity claudication which is lifestyle limiting for several years. Recent Dopplers performed in our office 03/20/14 revealed a right ABI 0.58 with an occluded distal right SFA and a left ABI 0.41 with a high-frequency signal in his left SFA. He underwent angiography and intervention on January 18 revealing a 80% Left internal carotid artery stenosis, and short segment occlusions in both SFAs. I was able to perform directional atherectomy on his left SFA followed by drug eluding balloon angioplasty. He had an excellent angiographic and clinical result. He ultimately underwent staged right SFA intervention with excellent angiographic and clinical result. He underwent elective left internal carotid artery stenting for high-grade (80%) asymptomatic left internal carotid artery stenosis 07/17/14. His recent carotid Dopplers performed 02/07/15 reveals to be widely patent. He does have recurrent claudication with Dopplers performed 02/07/15 revealing high-grade distal bilateral SFA restenosis. He underwent right SFA directional arthrectomy and drug-eluting balloon angioplasty on 02/15/15 with excellent angiographic and clinical result. He underwent  staged left SFA intervention by myself using directional atherectomy and drug-eluting balloon angioplasty on 02/25/15. Dopplers performed 2 weeks later that showed ABIs of 0.9 bilaterally. Recentlower extremity Dopplers performed 01/22/16 revealed recurrent bilateral SFA stenosis with slightly lower ABIs. He does complain of some mild claudication. Carotid Dopplers performed 01/24/16 revealed a widely patent left internal carotid artery stent. Because of these findings I decided to proceed with PTA and drug eluding stenting using Zilver PTX of both SFAs. I intervened on the left SFA on December 11. He underwent right SFA intervention 04/20/16. Follow-up lower extremity arterial Doppler studies performed 05/06/16 revealed widely patent stents with normal ABIs. His claudication has resolved. Unfortunately over the last month or 2 his claudication has really heard and his most recent Dopplers performed 10/22/16 revealed a decline in his right ABI 0.94 and his left ABI 0.82 with high-frequency signals in the mid right SFA and proximal left SFA. He underwent peripheral angiography by myself 12/21/16 revealing high-grade bilateral Zilver in-stent restenosis. I performed drug-eluting balloon angioplasty of his right SFA ISR with excellent angiographic result. ABI improved up to 1.1 and his claudication resolved. He presents today for left SFA intervention.    Current Meds  Medication Sig  . aspirin EC 81 MG tablet Take 1 tablet (81 mg total) by mouth daily.  [name] atorvastatin (LIPITOR) 40 MG tablet TAKE 1 TABLET BY MOUTH EVERY EVENING  . Blood Glucose Monitoring Suppl (ONE TOUCH ULTRA MINI) w/Device KIT   . Brimonidine Tartrate (LUMIFY OP) Apply 1 drop to eye daily as needed (redness).  . cetirizine (ZYRTEC) 10 MG tablet Take 10 mg by mouth daily as needed for allergies.   [name] clopidogrel (PLAVIX) 75 MG tablet TAKE 1 TABLET BY MOUTH DAILY  .  diltiazem (CARDIZEM CD) 120 MG 24 hr capsule TAKE 1 CAPSULE BY MOUTH DAILY    . gabapentin (NEURONTIN) 300 MG capsule Take 300 mg by mouth at bedtime.   [name] JANUMET 50-500 MG tablet Take 1 tablet by mouth 2 (two) times daily.   [name] latanoprost (XALATAN) 0.005 % ophthalmic solution Place 1 drop into both eyes at bedtime.   [name] lisinopril (PRINIVIL,ZESTRIL) 5 MG tablet TAKE ONE (1) TABLET BY MOUTH EVERY DAY (Patient taking differently: Take 5 mg by mouth twice daily)  . methocarbamol (ROBAXIN) 750 MG tablet Take 1 tablet (750 mg total) by mouth 4 (four) times daily as needed (use for muscle cramps/pain).  . ONE TOUCH ULTRA TEST test strip   . ONETOUCH DELICA LANCETS 01B MISC   . traMADol (ULTRAM) 50 MG tablet Take 1-2 tablets (50-100 mg total) by mouth every 6 (six) hours as needed for moderate pain or severe pain.  [name] VIAGRA 100 MG tablet TAKE 1 TABLET BY MOUTH DAILY AS NEEDED FOR ERECTILE DYSFUNCTION     No Known Allergies  Social History   Social History  . Marital status: Married    Spouse name: Levada Dy  . Number of children: 2  . Years of education: 15   Occupational History  . [employer], Materials management    Social History Main Topics  . Smoking status: Former Smoker    Packs/day: 0.50    Years: 45.00    Types: Cigarettes    Quit date: 03/01/2015  . Smokeless tobacco: Never Used     Comment: 02/25/2015 "stopped smoking 02/14/2015"  . Alcohol use 0.0 oz/week     Comment: 12/21/2016 "might have a couple drinks/year"  . Drug use: Yes     Comment: "might have used some drugs in my 20's"  . Sexual activity: Yes   Other Topics Concern  . Not on file   Social History Narrative   Lives with wife   caffeine- coffee, 1-2 cups daily     Review of Systems: General: negative for chills, fever, night sweats or weight changes.  Cardiovascular: negative for chest pain, dyspnea on exertion, edema, orthopnea, palpitations, paroxysmal nocturnal dyspnea or shortness of breath Dermatological: negative for rash Respiratory: negative for cough or wheezing Urologic:  negative for hematuria Abdominal: negative for nausea, vomiting, diarrhea, bright red blood per rectum, melena, or hematemesis Neurologic: negative for visual changes, syncope, or dizziness All other systems reviewed and are otherwise negative except as noted above.    Blood pressure 124/71, pulse 61, height 6' 1"  (1.854 m), weight 187 lb (84.8 kg).  General appearance: alert and no distress Neck: no adenopathy, no carotid bruit, no JVD, supple, symmetrical, trachea midline and thyroid not enlarged, symmetric, no tenderness/mass/nodules Lungs: clear to auscultation bilaterally Heart: regular rate and rhythm, S1, S2 normal, no murmur, click, rub or gallop Extremities: extremities normal, atraumatic, no cyanosis or edema Pulses: 2+ and symmetric Skin: Skin color, texture, turgor normal. No rashes or lesions Neurologic: Alert and oriented X 3, normal strength and tone. Normal symmetric reflexes. Normal coordination and gait  EKG not performed today  ASSESSMENT AND PLAN:   Claudication in peripheral vascular disease [facility]) Mr. Rosenzweig returns for post hospital visit after his recent endovascular procedure performed by myself 12/21/16 with drug eluting balloon angioplasty of his right SFA Zilver in-stent restenosis. His right ABI fully normalized. He does have residual high-grade left Zilver ISR with claudication on that side and he wishes to pursue revascularization which I will schedule next week.  [name] FACP,FACC,FAHA, [facility] 01/08/2017 10:47 AM

## 2017-01-14 NOTE — Care Management Note (Signed)
Case Management Note  Patient Details  Name: Anthony Brock MRN: [id number] Date of Birth: Jun 18, 1955  Subjective/Objective:  From home with wife, s/p lower ext intervention, will be on plavix.                   Action/Plan: NCM will follow for dc needs.   Expected Discharge Date:  01/15/17               Expected Discharge Plan:  Home/Self Care  In-House Referral:     Discharge planning Services  CM Consult  Post Acute Care Choice:    Choice offered to:     DME Arranged:    DME Agency:     HH Arranged:    HH Agency:     Status of Service:  Completed, signed off  If discussed at [employer] of Stay Meetings, dates discussed:    Additional Comments:  Leone Haven, RN 01/14/2017, 4:37 PM

## 2017-01-15 DIAGNOSIS — E1159 Type 2 diabetes mellitus with other circulatory complications: Secondary | ICD-10-CM

## 2017-01-15 DIAGNOSIS — I10 Essential (primary) hypertension: Secondary | ICD-10-CM | POA: Diagnosis not present

## 2017-01-15 DIAGNOSIS — I472 Ventricular tachycardia: Secondary | ICD-10-CM

## 2017-01-15 DIAGNOSIS — I739 Peripheral vascular disease, unspecified: Secondary | ICD-10-CM

## 2017-01-15 DIAGNOSIS — I70212 Atherosclerosis of native arteries of extremities with intermittent claudication, left leg: Secondary | ICD-10-CM | POA: Diagnosis not present

## 2017-01-15 LAB — CBC
HCT: 30.6 % — ABNORMAL LOW (ref 39.0–52.0)
Hemoglobin: 10.5 g/dL — ABNORMAL LOW (ref 13.0–17.0)
MCH: 29.2 pg (ref 26.0–34.0)
MCHC: 34.3 g/dL (ref 30.0–36.0)
MCV: 85 fL (ref 78.0–100.0)
Platelets: 155 10*3/uL (ref 150–400)
RBC: 3.6 MIL/uL — AB (ref 4.22–5.81)
RDW: 12.4 % (ref 11.5–15.5)
WBC: 3.6 10*3/uL — AB (ref 4.0–10.5)

## 2017-01-15 LAB — BASIC METABOLIC PANEL
Anion gap: 4 — ABNORMAL LOW (ref 5–15)
BUN: 13 mg/dL (ref 6–20)
CALCIUM: 8.6 mg/dL — AB (ref 8.9–10.3)
CHLORIDE: 104 mmol/L (ref 101–111)
CO2: 24 mmol/L (ref 22–32)
CREATININE: 1.25 mg/dL — AB (ref 0.61–1.24)
Glucose, Bld: 123 mg/dL — ABNORMAL HIGH (ref 65–99)
Potassium: 3.8 mmol/L (ref 3.5–5.1)
SODIUM: 132 mmol/L — AB (ref 135–145)

## 2017-01-15 LAB — GLUCOSE, CAPILLARY: Glucose-Capillary: 136 mg/dL — ABNORMAL HIGH (ref 65–99)

## 2017-01-15 MED ORDER — ANGIOPLASTY BOOK
Freq: Once | Status: DC
  Filled 2017-01-15: qty 1

## 2017-01-15 MED FILL — Lidocaine HCl Local Inj 2%: INTRAMUSCULAR | Qty: 10 | Status: AC

## 2017-01-15 NOTE — Discharge Summary (Signed)
Discharge Summary    Patient ID: Anthony Brock,  MRN: [id number], DOB/AGE: [birth year] 62 y.o.  Admit date: 01/14/2017 Discharge date: 01/15/2017  Primary Care Provider: Hayden Rasmussen Primary Cardiologist: Gwenlyn Found   Discharge Diagnoses    Active Problems:   Claudication in peripheral vascular disease [facility])   Allergies No Known Allergies  Diagnostic Studies/Procedures    PV angiogram: 01/14/17  Procedures Performed:            1. Right common femoral access/contralateral access            2. Drug-eluting balloon angioplasty left SFA "in-stent restenosis            3. Hawk one directional atherectomy proximal left SFA            4. Drug eluding balloon angioplasty proximal left SFA  Final Impression: Successful left SFA drug-eluting Blanche plasty for "in-stent restenosis of a previously placed mid left SFA Zilver stent as well as Hawk one directional atherectomy of an eccentric proximal left SFA stenosis followed by drug-eluting balloon angioplasty for lifestyle limiting claudication. The patient tolerated the procedure well. He left the lab in stable condition. The sheath will be removed once a day followed below 170 and pressure held. He'll be hydrated overnight and discharged home in the morning on dual antiplatelet therapy. He will obtain lower extremity arterial Doppler studies on his left leg in our [place] office next week and I will see him back 2-3 weeks thereafter.  Quay Burow. MD, [facility] _____________   History of Present Illness     61 year old male with past medical history of tobacco abuse, type 2 diabetes, PAD, hyperlipidemia, hypertension, carotid artery disease who is followed by Dr. Gwenlyn Found for PAD. He has had multiple interventions to both the right and left extremities over the past several years. Most recently presented to the office on 01/08/17 after having underwent peripheral angiography on 12/21/16 revealing high-grade bilateral in-stent restenosis. He  had a drug-eluting balloon angioplasty of his right SFA ISR with excellent angiographic result, with ABI improved to 1.1, and claudication improved. At his follow-up appointment he presented for his left SFA intervention and wish to proceed with revascularization.  Hospital Course     Underwent peripheral angiography with successful left SFA drug-eluting angioplasty for in-stent restenosis of previously placed mid left SFA Zilver stent, along with [facility] one directional arthrectomy of proximal left SFA stenosis with drug-eluting balloon angioplasty. Plan for DAPT with ASA/Plavix. He was hydrated overnight with post cath labs showing Cr 1.25, and Hgb 10.5. No complications noted post cath. Was noted to have an episode of NSVT on telemetry but was symptomatic. Consider outpatient work, will defer to primary cardiologist. He was ambulatory around the unit without any complaints.   General: Well developed, well nourished, male appearing in no acute distress. Head: Normocephalic, atraumatic.  Neck: Supple without bruits, JVD. Lungs:  Resp regular and unlabored, CTA. Heart: RRR, S1, S2, no S3, S4, or murmur; no rub. Abdomen: Soft, non-tender, non-distended with normoactive bowel sounds. No hepatomegaly. No rebound/guarding. No obvious abdominal masses. Extremities: No clubbing, cyanosis, edema. Distal pedal pulses are 2+ bilaterally. R groin cath site stable without bruising or hematoma Neuro: Alert and oriented X 3. Moves all extremities spontaneously. Psych: Normal affect.  Anthony Brock was seen by Dr. Irish Lack and determined stable for discharge home. Follow up in the office has been arranged. Medications are listed below.   _____________  Discharge Vitals Blood pressure (!) 137/59,  pulse 61, temperature 98.2 F (36.8 C), temperature source Oral, resp. rate 15, height 6' 1"  (1.854 m), weight 187 lb (84.8 kg), SpO2 95 %.  Filed Weights   01/14/17 0920  Weight: 187 lb (84.8 kg)    Labs &  Radiologic Studies    CBC  Recent Labs  01/15/17 0351  WBC 3.6*  HGB 10.5*  HCT 30.6*  MCV 85.0  PLT 017   Basic Metabolic Panel  Recent Labs  01/15/17 0351  NA 132*  K 3.8  CL 104  CO2 24  GLUCOSE 123*  BUN 13  CREATININE 1.25*  CALCIUM 8.6*   Liver Function Tests No results for input(s): AST, ALT, ALKPHOS, BILITOT, PROT, ALBUMIN in the last 72 hours. No results for input(s): LIPASE, AMYLASE in the last 72 hours. Cardiac Enzymes No results for input(s): CKTOTAL, CKMB, CKMBINDEX, TROPONINI in the last 72 hours. BNP Invalid input(s): POCBNP D-Dimer No results for input(s): DDIMER in the last 72 hours. Hemoglobin A1C No results for input(s): HGBA1C in the last 72 hours. Fasting Lipid Panel No results for input(s): CHOL, HDL, LDLCALC, TRIG, CHOLHDL, LDLDIRECT in the last 72 hours. Thyroid Function Tests No results for input(s): TSH, T4TOTAL, T3FREE, THYROIDAB in the last 72 hours.  Invalid input(s): FREET3 _____________  Dg Chest 2 View  Result Date: 01/11/2017 CLINICAL DATA:  Pre procedure for claudication. EXAM: CHEST  2 VIEW COMPARISON:  03/13/2016 FINDINGS: The heart size and mediastinal contours are within normal limits. Both lungs are clear. The visualized skeletal structures are unremarkable. IMPRESSION: No active cardiopulmonary disease. Electronically Signed   By: Kathreen Devoid   On: 01/11/2017 15:00   Disposition   Pt is being discharged home today in good condition.  Follow-up Plans & Appointments    Follow-up Information    [facility] Follow up on 01/28/2017.   Specialty:  Cardiology Why:  at 11am for your follow up dopplers.  Contact information: [address] [place]       Lorretta Harp, MD Follow up on 02/02/2017.   Specialties:  Cardiology, Radiology Why:  at 11am for your follow up appt. Contact information: [address] [place]  [address] [phone]          Discharge Instructions    Call MD for:  redness, tenderness, or signs of infection (pain, swelling, redness, odor or green/yellow discharge around incision site)    Complete by:  As directed    Diet - low sodium heart healthy    Complete by:  As directed    Discharge instructions    Complete by:  As directed    Groin Site Care Refer to this sheet in the next few weeks. These instructions provide you with information on caring for yourself after your procedure. Your caregiver may also give you more specific instructions. Your treatment has been planned according to current medical practices, but problems sometimes occur. Call your caregiver if you have any problems or questions after your procedure. HOME CARE INSTRUCTIONS You may shower 24 hours after the procedure. Remove the bandage (dressing) and gently wash the site with plain soap and water. Gently pat the site dry.  Do not apply powder or lotion to the site.  Do not sit in a bathtub, swimming pool, or whirlpool for 5 to 7 days.  No bending, squatting, or lifting anything over 10 pounds (4.5 kg) as directed by your caregiver.  Inspect the site at least twice  daily.  Do not drive home if you are discharged the same day of the procedure. Have someone else drive you.  You may drive 24 hours after the procedure unless otherwise instructed by your caregiver.  What to expect: Any bruising will usually fade within 1 to 2 weeks.  Blood that collects in the tissue (hematoma) may be painful to the touch. It should usually decrease in size and tenderness within 1 to 2 weeks.  SEEK IMMEDIATE MEDICAL CARE IF: You have unusual pain at the groin site or down the affected leg.  You have redness, warmth, swelling, or pain at the groin site.  You have drainage (other than a small amount of blood on the dressing).  You have chills.  You have a fever or persistent symptoms for more than 72 hours.  You have a fever and  your symptoms suddenly get worse.  Your leg becomes pale, cool, tingly, or numb.  You have heavy bleeding from the site. Hold pressure on the site. .   Increase activity slowly    Complete by:  As directed       Discharge Medications     Medication List    TAKE these medications   aspirin EC 81 MG tablet Take 1 tablet (81 mg total) by mouth daily.   atorvastatin 40 MG tablet Commonly known as:  LIPITOR TAKE 1 TABLET BY MOUTH EVERY EVENING   cetirizine 10 MG tablet Commonly known as:  ZYRTEC Take 10 mg by mouth daily as needed for allergies.   clopidogrel 75 MG tablet Commonly known as:  PLAVIX TAKE 1 TABLET BY MOUTH DAILY   diltiazem 120 MG 24 hr capsule Commonly known as:  CARDIZEM CD TAKE 1 CAPSULE BY MOUTH DAILY   gabapentin 300 MG capsule Commonly known as:  NEURONTIN Take 300 mg by mouth at bedtime.   JANUMET 50-500 MG tablet Generic drug:  sitaGLIPtin-metformin Take 1 tablet by mouth 2 (two) times daily.   latanoprost 0.005 % ophthalmic solution Commonly known as:  XALATAN Place 1 drop into both eyes at bedtime.   lisinopril 5 MG tablet Commonly known as:  PRINIVIL,ZESTRIL TAKE ONE (1) TABLET BY MOUTH EVERY DAY What changed:  See the new instructions.   LUMIFY OP Apply 1 drop to eye daily as needed (redness).   ONE TOUCH ULTRA MINI w/Device Kit   ONE TOUCH ULTRA TEST test strip Generic drug:  glucose blood   ONETOUCH DELICA LANCETS 60F Misc   VIAGRA 100 MG tablet Generic drug:  sildenafil TAKE 1 TABLET BY MOUTH DAILY AS NEEDED FOR ERECTILE DYSFUNCTION         Outstanding Labs/Studies   Follow up dopplers   Duration of Discharge Encounter   Greater than 30 minutes including physician time.  Signed, Reino Bellis NP-C 01/15/2017, 10:42 AM   I have examined the patient and reviewed assessment and plan and discussed with patient.  Agree with above as stated.  Right groin stable.  Right foot well perfused.  No pain with walking.  OK to  be out of work until he sees Dr. Gwenlyn Found in a few weeks.  He has  A very physical job also.   GEN: Well nourished, well developed, in no acute distress  HEENT: normal  Neck: no JVD, carotid bruits, or masses Cardiac: RRR; no murmurs, rubs, or gallops,no edema  Respiratory:  clear to auscultation bilaterally, normal work of breathing GI: soft, nontender, nondistended,  MS: no deformity or atrophy ; no right groin hematoma  Skin: warm and dry, no rash Neuro:  Strength and sensation are intact Psych: euthymic mood, full affect  He had some NSVT, but was asymptomatic.  LVEF was normal in the past.  Continue aggressive secondary prevention.  Discharge today  [facility]

## 2017-01-16 LAB — POCT ACTIVATED CLOTTING TIME: Activated Clotting Time: 153 seconds

## 2017-01-20 ENCOUNTER — Ambulatory Visit (HOSPITAL_COMMUNITY)
Admission: RE | Admit: 2017-01-20 | Discharge: 2017-01-20 | Disposition: A | Discharge status: Home / Self Care | Payer: BLUE CROSS/BLUE SHIELD | Source: Ambulatory Visit | Attending: Internal Medicine | Admitting: Internal Medicine

## 2017-01-20 DIAGNOSIS — I6529 Occlusion and stenosis of unspecified carotid artery: Secondary | ICD-10-CM | POA: Diagnosis not present

## 2017-01-20 DIAGNOSIS — I10 Essential (primary) hypertension: Secondary | ICD-10-CM | POA: Insufficient documentation

## 2017-01-20 DIAGNOSIS — I6523 Occlusion and stenosis of bilateral carotid arteries: Secondary | ICD-10-CM | POA: Insufficient documentation

## 2017-01-20 DIAGNOSIS — I739 Peripheral vascular disease, unspecified: Secondary | ICD-10-CM | POA: Diagnosis not present

## 2017-01-20 DIAGNOSIS — Z87891 Personal history of nicotine dependence: Secondary | ICD-10-CM | POA: Diagnosis not present

## 2017-01-20 DIAGNOSIS — E785 Hyperlipidemia, unspecified: Secondary | ICD-10-CM | POA: Diagnosis not present

## 2017-01-20 DIAGNOSIS — E119 Type 2 diabetes mellitus without complications: Secondary | ICD-10-CM | POA: Diagnosis not present

## 2017-01-26 ENCOUNTER — Other Ambulatory Visit: Payer: Self-pay | Admitting: Cardiovascular Disease

## 2017-01-26 DIAGNOSIS — I6529 Occlusion and stenosis of unspecified carotid artery: Secondary | ICD-10-CM

## 2017-01-27 ENCOUNTER — Ambulatory Visit: Payer: BLUE CROSS/BLUE SHIELD | Admitting: Cardiovascular Disease

## 2017-01-27 ENCOUNTER — Other Ambulatory Visit: Payer: Self-pay | Admitting: Physician Assistant

## 2017-01-28 ENCOUNTER — Ambulatory Visit (HOSPITAL_COMMUNITY)
Admission: RE | Admit: 2017-01-28 | Discharge: 2017-01-28 | Disposition: A | Discharge status: Home / Self Care | Payer: BLUE CROSS/BLUE SHIELD | Source: Ambulatory Visit | Attending: Cardiology | Admitting: Cardiology

## 2017-01-28 DIAGNOSIS — I739 Peripheral vascular disease, unspecified: Secondary | ICD-10-CM | POA: Diagnosis not present

## 2017-02-02 ENCOUNTER — Encounter: Payer: Self-pay | Admitting: Cardiovascular Disease

## 2017-02-02 ENCOUNTER — Ambulatory Visit (INDEPENDENT_AMBULATORY_CARE_PROVIDER_SITE_OTHER): Payer: BLUE CROSS/BLUE SHIELD | Admitting: Cardiovascular Disease

## 2017-02-02 VITALS — BP 115/70 | HR 53 | Ht 73.0 in | Wt 188.0 lb

## 2017-02-02 DIAGNOSIS — R0609 Other forms of dyspnea: Secondary | ICD-10-CM

## 2017-02-02 DIAGNOSIS — R079 Chest pain, unspecified: Secondary | ICD-10-CM | POA: Diagnosis not present

## 2017-02-02 DIAGNOSIS — I472 Ventricular tachycardia: Secondary | ICD-10-CM | POA: Diagnosis not present

## 2017-02-02 DIAGNOSIS — R0600 Dyspnea, unspecified: Secondary | ICD-10-CM

## 2017-02-02 DIAGNOSIS — I4729 Other ventricular tachycardia: Secondary | ICD-10-CM | POA: Insufficient documentation

## 2017-02-02 NOTE — Assessment & Plan Note (Signed)
Mr. Anthony Brock returns today for his first post hospital follow-up visit after his recent intervention which I did 01/14/17. I performed drug eluting blanch plasty of his proximal left SFA as well as his mid left SFA "in-stent restenosis. His claudication has improved. His Dopplers likewise performed 01/28/17 essentially normalized. He is on dual antiplatelet therapy which she will remain on. We will get follow-up Dopplers on him in 6 months after which I'll see him back in follow-up.

## 2017-02-02 NOTE — Assessment & Plan Note (Signed)
Mr. Anthony Brock had an episode of nonsustained ventricular tachycardia while on telemetry in the hospital during his short stay for peripheral intervention. He was a symptomatic from this.

## 2017-02-02 NOTE — Progress Notes (Signed)
02/02/2017 Anthony Brock   02/09/1956  [id number]  Primary Physician Darron Doom Maebelle Munroe, MD Primary Cardiologist: [name] Lupe Carney, [place]  HPI:  Anthony Brock is a 61 y.o. male malemarried African-American veteran who works at [employer]. He was referred by Dr. Melony Overly at [facility] for peripheral vascular evaluation because of lifestyle limiting claudication.I last saw him in the office 01/08/17. He had carotid stenting by myself 07/17/14.[name] He has a long history of tobacco abuse smoking three-quarter pack a day for last 30 years having recently stopped smoking after his last peripheral vascular procedure.. He has never had a heart attack or stroke, and denies chest pain or shortness of breath. He states that he has had left greater than right lower extremity claudication which is lifestyle limiting for several years. Recent Dopplers performed in our office 03/20/14 revealed a right ABI 0.58 with an occluded distal right SFA and a left ABI 0.41 with a high-frequency signal in his left SFA. He underwent angiography and intervention on January 18 revealing a 80% Left internal carotid artery stenosis, and short segment occlusions in both SFAs. I was able to perform directional atherectomy on his left SFA followed by drug eluding balloon angioplasty. He had an excellent angiographic and clinical result. He ultimately underwent staged right SFA intervention with excellent angiographic and clinical result. He underwent elective left internal carotid artery stenting for high-grade (80%) asymptomatic left internal carotid artery stenosis 07/17/14. His recent carotid Dopplers performed 02/07/15 reveals to be widely patent. He does have recurrent claudication with Dopplers performed 02/07/15 revealing high-grade distal bilateral SFA restenosis. He underwent right SFA directional arthrectomy and drug-eluting balloon angioplasty on 02/15/15 with excellent angiographic and clinical result. He  underwent staged left SFA intervention by myself using directional atherectomy and drug-eluting balloon angioplasty on 02/25/15. Dopplers performed 2 weeks later that showed ABIs of 0.9 bilaterally. Recentlower extremity Dopplers performed 01/22/16 revealed recurrent bilateral SFA stenosis with slightly lower ABIs. He does complain of some mild claudication. Carotid Dopplers performed 01/24/16 revealed a widely patent left internal carotid artery stent. Because of these findings I decided to proceed with PTA and drug eluding stenting using Zilver PTX of both SFAs. I intervened on the left SFA on December 11. He underwent right SFA intervention 04/20/16. Follow-up lower extremity arterial Doppler studies performed 05/06/16 revealed widely patent stents with normal ABIs. His claudication has resolved. Unfortunately over the last month or 2 his claudication has really heard and his most recent Dopplers performed 10/22/16 revealed a decline in his right ABI 0.94 and his left ABI 0.82 with high-frequency signals in the mid right SFA and proximal left SFA. He underwent peripheral angiography by myself 12/21/16 revealing high-grade bilateral Zilver in-stent restenosis. I performed drug-eluting balloon angioplasty of his right SFA ISR with excellent angiographic result. ABI improved up to 1.1 and his claudication resolved. He underwent left SFA intervention by myself 01/14/17 with a polypoid atherectomy of the proximal left SFA a along with drug-eluting dementia plasty of the left SFA "in-stent restenosis. His follow-up Dopplers performed 01/28/17 showed marked improvement. His claudication has essentially resolved on that side. He has noticed some increasing dyspnea on exertion however as well as some occasional substernal chest pain.   Current Meds  Medication Sig  . aspirin EC 81 MG tablet Take 1 tablet (81 mg total) by mouth daily.  [name] atorvastatin (LIPITOR) 40 MG tablet TAKE 1 TABLET BY MOUTH EVERY EVENING  . Blood  Glucose Monitoring  Suppl (ONE TOUCH ULTRA MINI) w/Device KIT   . Brimonidine Tartrate (LUMIFY OP) Apply 1 drop to eye daily as needed (redness).  . cetirizine (ZYRTEC) 10 MG tablet Take 10 mg by mouth daily as needed for allergies.   [name] clopidogrel (PLAVIX) 75 MG tablet TAKE 1 TABLET BY MOUTH DAILY  . diltiazem (CARDIZEM CD) 120 MG 24 hr capsule TAKE 1 CAPSULE BY MOUTH DAILY  . gabapentin (NEURONTIN) 300 MG capsule Take 300 mg by mouth at bedtime.   [name] JANUMET 50-500 MG tablet Take 1 tablet by mouth 2 (two) times daily.   [name] latanoprost (XALATAN) 0.005 % ophthalmic solution Place 1 drop into both eyes at bedtime.   [name] lisinopril (PRINIVIL,ZESTRIL) 5 MG tablet TAKE ONE (1) TABLET BY MOUTH EVERY DAY (Patient taking differently: Take 5 mg by mouth twice daily)  . ONE TOUCH ULTRA TEST test strip   . ONETOUCH DELICA LANCETS 27O MISC   . VIAGRA 100 MG tablet TAKE 1 TABLET BY MOUTH DAILY AS NEEDED FOR ERECTILE DYSFUNCTION     No Known Allergies  Social History   Social History  . Marital status: Married    Spouse name: Levada Dy  . Number of children: 2  . Years of education: 15   Occupational History  . [employer], Materials management    Social History Main Topics  . Smoking status: Former Smoker    Packs/day: 0.50    Years: 45.00    Types: Cigarettes    Quit date: 03/01/2015  . Smokeless tobacco: Never Used     Comment: 02/25/2015 "stopped smoking 02/14/2015"  . Alcohol use 0.0 oz/week     Comment: 12/21/2016 "might have a couple drinks/year"  . Drug use: Yes     Comment: "might have used some drugs in my 20's"  . Sexual activity: Yes   Other Topics Concern  . Not on file   Social History Narrative   Lives with wife   caffeine- coffee, 1-2 cups daily     Review of Systems: General: negative for chills, fever, night sweats or weight changes.  Cardiovascular: negative for chest pain, dyspnea on exertion, edema, orthopnea, palpitations, paroxysmal nocturnal dyspnea or shortness of  breath Dermatological: negative for rash Respiratory: negative for cough or wheezing Urologic: negative for hematuria Abdominal: negative for nausea, vomiting, diarrhea, bright red blood per rectum, melena, or hematemesis Neurologic: negative for visual changes, syncope, or dizziness All other systems reviewed and are otherwise negative except as noted above.    Blood pressure 115/70, pulse (!) 53, height _0  (1.854 m), weight 188 lb (85.3 kg), SpO2 98 %.  General appearance: alert and no distress Neck: no adenopathy, no carotid bruit, no JVD, supple, symmetrical, trachea midline and thyroid not enlarged, symmetric, no tenderness/mass/nodules Lungs: clear to auscultation bilaterally Heart: regular rate and rhythm, S1, S2 normal, no murmur, click, rub or gallop Extremities: extremities normal, atraumatic, no cyanosis or edema Pulses: 2+ and symmetric Skin: Skin color, texture, turgor normal. No rashes or lesions Neurologic: Alert and oriented X 3, normal strength and tone. Normal symmetric reflexes. Normal coordination and gait  EKG not performed today  ASSESSMENT AND PLAN:   PAD (peripheral artery disease) [facility]) Mr. Raper returns today for his first post hospital follow-up visit after his recent intervention which I did 01/14/17. I performed drug eluting blanch plasty of his proximal left SFA as well as his mid left SFA "in-stent restenosis. His claudication has improved. His Dopplers likewise performed 01/28/17 essentially normalized. He is on  dual antiplatelet therapy which she will remain on. We will get follow-up Dopplers on him in 6 months after which I'll see him back in follow-up.  Nonsustained ventricular tachycardia [facility]) Mr. Vester had an episode of nonsustained ventricular tachycardia while on telemetry in the hospital during his short stay for peripheral intervention. He was a symptomatic from this.  Dyspnea on exertion Suture complaints of dyspnea on exertion somewhat  more noticeable recently along with some occasional chest pain. He does have multiple cardiac risk factors as well as known vascular disease. His last Myoview performed 04/12/15 was read as intermediate risk with an inferobasal defect thought to be infarct versus diaphragmatic attenuation without ischemia. A 2-D echocardiogram performed 05/24/15 revealed normal LV function. I am going to get a repeat 2-D echo and follow blood Myoview stress test to rule out an ischemic etiology.      [name] FACP,FACC,FAHA, [facility] 02/02/2017 12:13 PM

## 2017-02-02 NOTE — Assessment & Plan Note (Signed)
Suture complaints of dyspnea on exertion somewhat more noticeable recently along with some occasional chest pain. He does have multiple cardiac risk factors as well as known vascular disease. His last Myoview performed 04/12/15 was read as intermediate risk with an inferobasal defect thought to be infarct versus diaphragmatic attenuation without ischemia. A 2-D echocardiogram performed 05/24/15 revealed normal LV function. I am going to get a repeat 2-D echo and follow blood Myoview stress test to rule out an ischemic etiology.

## 2017-02-02 NOTE — Patient Instructions (Signed)
Your physician has requested that you have an echocardiogram @ [address]. Echocardiography is a painless test that uses sound waves to create images of your heart. It provides your doctor with information about the size and shape of your heart and how well your heart's chambers and valves are working. This procedure takes approximately one hour. There are no restrictions for this procedure.  Your physician has requested that you have a lexiscan myoview. For further information please visit [email or web address]. Please follow instruction sheet, as given.  Your physician wants you to follow-up in: 6 months with Dr. Allyson SabalBerry. You will receive a reminder letter in the mail two months in advance. If you don't receive a letter, please call our office to schedule the follow-up appointment.

## 2017-02-04 ENCOUNTER — Telehealth (HOSPITAL_COMMUNITY): Payer: Self-pay

## 2017-02-04 ENCOUNTER — Other Ambulatory Visit: Payer: Self-pay

## 2017-02-04 ENCOUNTER — Ambulatory Visit (HOSPITAL_COMMUNITY): Payer: BLUE CROSS/BLUE SHIELD | Attending: Cardiovascular Disease

## 2017-02-04 DIAGNOSIS — R079 Chest pain, unspecified: Secondary | ICD-10-CM | POA: Diagnosis not present

## 2017-02-04 DIAGNOSIS — R0600 Dyspnea, unspecified: Secondary | ICD-10-CM

## 2017-02-04 DIAGNOSIS — I420 Dilated cardiomyopathy: Secondary | ICD-10-CM | POA: Diagnosis not present

## 2017-02-04 NOTE — Telephone Encounter (Signed)
Encounter complete. 

## 2017-02-09 ENCOUNTER — Ambulatory Visit (HOSPITAL_COMMUNITY)
Admission: RE | Admit: 2017-02-09 | Discharge: 2017-02-09 | Disposition: A | Discharge status: Home / Self Care | Payer: BLUE CROSS/BLUE SHIELD | Source: Ambulatory Visit | Attending: Cardiovascular Disease | Admitting: Cardiovascular Disease

## 2017-02-09 DIAGNOSIS — R0600 Dyspnea, unspecified: Secondary | ICD-10-CM | POA: Diagnosis not present

## 2017-02-09 DIAGNOSIS — R079 Chest pain, unspecified: Secondary | ICD-10-CM | POA: Insufficient documentation

## 2017-02-09 LAB — MYOCARDIAL PERFUSION IMAGING
CHL CUP NUCLEAR SDS: 0
CHL CUP NUCLEAR SRS: 1
CHL CUP NUCLEAR SSS: 1
LV dias vol: 146 mL (ref 62–150)
LV sys vol: 78 mL
Peak HR: 75 {beats}/min
Rest HR: 62 {beats}/min
TID: 1.16

## 2017-02-09 MED ORDER — TECHNETIUM TC 99M TETROFOSMIN IV KIT
10.2000 | PACK | Freq: Once | INTRAVENOUS | Status: AC | PRN
  Administered 2017-02-09: 10.2 via INTRAVENOUS
  Filled 2017-02-09: qty 11

## 2017-02-09 MED ORDER — REGADENOSON 0.4 MG/5ML IV SOLN
0.4000 mg | Freq: Once | INTRAVENOUS | Status: AC
  Administered 2017-02-09: 0.4 mg via INTRAVENOUS

## 2017-02-09 MED ORDER — TECHNETIUM TC 99M TETROFOSMIN IV KIT
29.8000 | PACK | Freq: Once | INTRAVENOUS | Status: AC | PRN
  Administered 2017-02-09: 29.8 via INTRAVENOUS
  Filled 2017-02-09: qty 30

## 2017-02-10 ENCOUNTER — Other Ambulatory Visit: Payer: Self-pay | Admitting: Cardiovascular Disease

## 2017-02-10 DIAGNOSIS — I739 Peripheral vascular disease, unspecified: Secondary | ICD-10-CM

## 2017-02-15 ENCOUNTER — Telehealth: Payer: Self-pay | Admitting: Cardiovascular Disease

## 2017-02-15 NOTE — Telephone Encounter (Signed)
Returned call to patient, patient states he left papers at the office for Dr. Hazle CocaBerry's PA Randall An(Brittany Strader PA) to fill out, fitness for duty paperwork.   States the date he put on the form was wrong and needs to be corrected.  States the date he put was 11/5 and needs to be changed to 11/12.   Also requesting test results.  Results reviewed with patient.    Spoke with PA-unaware of any forms to fill out for patient.  Will route to primary nurse to follow up on.   Patient also requesting copies of recent studies when he picks up this paperwork.    Patient aware and verbalized understanding.

## 2017-02-15 NOTE — Telephone Encounter (Signed)
t saw Dr Allyson SabalBerry on 02-02-17. He left a form to be filled out by his PA. Please call,he needs a date changed on that form. He also would like to have his test results from 02-04-17 and 02-09-17. He wants to discuss the test and wants a copy of the results.

## 2017-02-18 NOTE — Telephone Encounter (Signed)
Pt calling to find out the status of the forms he left.He said if he need to bring another form to be filled out,please let him know.

## 2017-02-19 NOTE — Telephone Encounter (Signed)
Fitness for Duty form is up front and ready for pick-up by pt on Monday.

## 2017-02-19 NOTE — Telephone Encounter (Signed)
Pt calling again today,he would like to know ths status of his forms.Does he need to bring in some more forms?

## 2017-02-19 NOTE — Telephone Encounter (Signed)
Spoke to pt. Made aware that the forms are being filled out and will be ready for him by Monday, 11/12.

## 2017-02-26 ENCOUNTER — Telehealth: Payer: Self-pay

## 2017-02-26 NOTE — Telephone Encounter (Incomplete)
Received Attending Physician's Statement ([employer]) Forms back from [employer]. Faxed and received confirmation page. Faxed to [employer] at [phone]. Called patient, he is aware his forms were faxed and the original forms are ready for  pick up. 02/26/17 ab

## 2017-03-01 ENCOUNTER — Telehealth: Payer: Self-pay | Admitting: Cardiovascular Disease

## 2017-03-01 NOTE — Telephone Encounter (Signed)
Please call,concerning some information he will need for his short term disability.

## 2017-03-01 NOTE — Telephone Encounter (Signed)
Spoke with pt. He would like to extend his short term disability ([employer]) to April 2019 due to his PAD and Heart murmur. He states he does not like his job, which he stated he had told Dr. Allyson SabalBerry about, and does not want to go back there. He was working there for the benefits which covered his procedures. He is attempting to extend his short term disability long enough to switch over to long term disability.  To do this, he needs a letter stating the diagnosis or reason for the extension signed by Dr. Allyson SabalBerry and faxed to Heloise OchoaAlexandria Armstrong, his claim representative at [phone].  OK to do this for pt?

## 2017-03-02 NOTE — Telephone Encounter (Signed)
I've revascularized his legs so at this point I can't extent his disability

## 2017-03-03 ENCOUNTER — Other Ambulatory Visit: Payer: Self-pay | Admitting: Cardiovascular Disease

## 2017-03-03 NOTE — Telephone Encounter (Signed)
REFILL 

## 2017-03-10 NOTE — Telephone Encounter (Signed)
Follow up   Pt verbalized that he is calling again to hear back from the rn or Dr.Berrry about options to have his short term disability extended.   Pt want a call today

## 2017-03-11 NOTE — Telephone Encounter (Signed)
Spoke to pt and informed that we will not be able to provide that due to Dr. Allyson SabalBerry fixing his legs. Pt stated he will try to contact his PCP for this, and if not, will need to modify Fitness for duty statement.

## 2017-04-16 ENCOUNTER — Telehealth: Payer: Self-pay | Admitting: Cardiovascular Disease

## 2017-04-16 NOTE — Telephone Encounter (Signed)
Returned call to patient he was calling to see if we received additional information from his insurance company to continue his short term disability.Spoke with our medical records and they have not received anything from your insurance since 02/26/17.Stated he will call his insurance.

## 2017-04-16 NOTE — Telephone Encounter (Signed)
Anthony Brock is calling to find out if we have forward the paperwork for his  disability . Please call

## 2017-05-06 ENCOUNTER — Telehealth: Payer: Self-pay | Admitting: Cardiovascular Disease

## 2017-05-06 NOTE — Telephone Encounter (Signed)
Follow up    Patient calling to follow up on short term disability forms [facility]. Patient will also like confirmation that forms have been sent  and a copy for him to pick in office as well. Please call

## 2017-05-06 NOTE — Telephone Encounter (Signed)
Routed to [place], CMA to review and advise

## 2017-05-06 NOTE — Telephone Encounter (Signed)
Please call,cocnerning his disability.

## 2017-05-07 NOTE — Telephone Encounter (Signed)
Pt called for conformation of on [employer] paper work.   Please give him a call the Short term disability is being closes soon.   [employer]   [phone] [id number] Claim ID [id number] [phone] Fax  [phone]

## 2017-05-07 NOTE — Telephone Encounter (Signed)
Spoke to pt to inform him that we have not received anymore paperwork from [employer]. Pt stated they were supposed to fax over some questions for clarification for his Short term disability. Explained to pt that medical records has not received anything and neither have I. Pt asked if I could call [employer] to speak with his representative. Explained to pt that I could do that, but that it would be to modify Fitness for Duty form so he could return to work.  Also explained to pt that due to Dr. Allyson SabalBerry revascularizing his legs, Dr. Allyson SabalBerry cannot extend his disability and that this was discussed last November. Fitness for Duty form needed to be modified, but we also never received this. Pt stated he is not wanting to return to work because he does still have some balance issues. I informed him that any approval for disability could not come from us because Dr. Allyson SabalBerry revascularized his legs and therefore cannot approve this. Informed pt that this would need to come from his PCP.  Pt stated he does not want me to contact [employer] at this time and will ask his PCP to approve the disability.

## 2017-05-24 ENCOUNTER — Telehealth: Payer: Self-pay | Admitting: Cardiovascular Disease

## 2017-05-24 NOTE — Telephone Encounter (Signed)
New message  Pt verbalized that he is calling for the RN  To go over his leave of absence

## 2017-05-24 NOTE — Telephone Encounter (Signed)
The patient has been called back and informed. He stated that he needs paperwork that states that he is cleared to go back to work.

## 2017-05-24 NOTE — Telephone Encounter (Signed)
Per Dr. Allyson SabalBerry, he is cleared to go back to work. Since Dr. Allyson SabalBerry fixed his legs, we are not able to accomodate this. This was previously discussed with the pt. Please see previous telephone notes.

## 2017-05-24 NOTE — Telephone Encounter (Signed)
Returned the call to the patient. He stated that he is currently on short term disability. The forms have been faxed on 1/29 to this office. He is applying for an extension at this time until his appointment in March. He stated that he recently had a Fitness for duty form filled out but could not return back to work with those restrictions.He does not need the restrictions lifted. He only needs these forms filled out and sent back for his short term disability.

## 2017-05-27 ENCOUNTER — Telehealth: Payer: Self-pay | Admitting: *Deleted

## 2017-05-27 NOTE — Telephone Encounter (Signed)
Called Mr. Anthony Brock [facility]. Currently he is doing well. He has had multiple peripheral interventions documented since our last call.

## 2017-06-04 NOTE — Telephone Encounter (Signed)
Patient called requesting to speak to Dr. Hazle CocaBerry's assistant. Message routed.

## 2017-06-04 NOTE — Telephone Encounter (Signed)
Follow up   Patient calling because he would like to discuss his restrictions for work and short term disability. Please call

## 2017-06-07 NOTE — Telephone Encounter (Signed)
Spoke to pt. Pt requested appt with Dr. Allyson SabalBerry to discuss disability paperwork. Pt stated he is going to have [employer] send over his forms again.   Appt scheduled for 06/18/17.

## 2017-06-14 ENCOUNTER — Telehealth: Payer: Self-pay | Admitting: Cardiovascular Disease

## 2017-06-14 NOTE — Telephone Encounter (Signed)
Returned call to patient.  Patient reports he had a bad nose bleed this morning.  States he went to PCP and they were unable to get the bleeding to stop, therefore he sent him to Anthony Brock, ENT.   They were able to get it under control after holding pressure and Anthony Brock recommended holding Plavix and ASA until 3/8 when he sees [name] but was informed to call us to make sure this is okay.   States it is mostly stopped at this time, still "trickling" a little bit, was told to use Afrin and started on antibiotics for sinus infection.    Per chart review:  PAD (peripheral artery disease) [facility]) Mr. Anthony Brock returns today for his first post hospital follow-up visit after his recent intervention which I did 01/14/17. I performed drug eluting blanch plasty of his proximal left SFA as well as his mid left SFA "in-stent restenosis. His claudication has improved. His Dopplers likewise performed 01/28/17 essentially normalized. He is on dual antiplatelet therapy which she will remain on.   Spoke to DOD Dr Herbie Brock, ok to hold Plavix until OV Friday with [name] 3/8, left detailed message to make patient aware (ok per DPR).  Advised to call back with questions or concerns.

## 2017-06-14 NOTE — Telephone Encounter (Signed)
New Message  Pt c/o medication issue:  1. Name of Medication: clopidogrel (PLAVIX) 75 MG tablet  2. How are you currently taking this medication (dosage and times per day)? TAKE 1 TABLET BY MOUTH DAILY  3. Are you having a reaction (difficulty breathing--STAT)? no  4. What is your medication issue? Pt verbalized that Dr. Ezzard StandingNewman advised him to stop taking his Plavix until his appt with berry on 3/8 due to nose bleed. Please call

## 2017-06-17 ENCOUNTER — Telehealth: Payer: Self-pay | Admitting: Cardiovascular Disease

## 2017-06-18 ENCOUNTER — Ambulatory Visit: Payer: BLUE CROSS/BLUE SHIELD | Admitting: Cardiovascular Disease

## 2017-06-21 NOTE — Telephone Encounter (Signed)
Pt rescheduled appt to 07/09/17

## 2017-07-09 ENCOUNTER — Encounter: Payer: Self-pay | Admitting: Cardiovascular Disease

## 2017-07-09 ENCOUNTER — Ambulatory Visit (INDEPENDENT_AMBULATORY_CARE_PROVIDER_SITE_OTHER): Payer: BLUE CROSS/BLUE SHIELD | Admitting: Cardiovascular Disease

## 2017-07-09 VITALS — BP 122/72 | HR 53 | Ht 73.0 in | Wt 189.0 lb

## 2017-07-09 DIAGNOSIS — Z720 Tobacco use: Secondary | ICD-10-CM | POA: Diagnosis not present

## 2017-07-09 DIAGNOSIS — E785 Hyperlipidemia, unspecified: Secondary | ICD-10-CM

## 2017-07-09 DIAGNOSIS — I739 Peripheral vascular disease, unspecified: Secondary | ICD-10-CM | POA: Diagnosis not present

## 2017-07-09 DIAGNOSIS — I10 Essential (primary) hypertension: Secondary | ICD-10-CM | POA: Diagnosis not present

## 2017-07-09 DIAGNOSIS — I6522 Occlusion and stenosis of left carotid artery: Secondary | ICD-10-CM

## 2017-07-09 NOTE — Patient Instructions (Signed)
Medication Instructions: Your physician recommends that you continue on your current medications as directed. Please refer to the Current Medication list given to you today.   Testing/Procedures: Please schedule LEA dopplers to be done in April.  Follow-Up: Your physician wants you to follow-up in: 6 months with Dr. Allyson SabalBerry. You will receive a reminder letter in the mail two months in advance. If you don't receive a letter, please call our office to schedule the follow-up appointment.  If you need a refill on your cardiac medications before your next appointment, please call your pharmacy.

## 2017-07-09 NOTE — Assessment & Plan Note (Signed)
History of left internal carotid artery stenosis status post stenting by myself 07/17/14 which was followed by duplex ultrasound on a regular basis.

## 2017-07-09 NOTE — Assessment & Plan Note (Signed)
History of PAD status post multiple SFA interventions dating back to 2017. Most recently I intervene on his left SFA 01/14/17 on a proximal lesion as well as "in-stent restenosis". This follow-up Dopplers performed on 01/28/17 showed marked improvement and he enjoyed symptomatic relief for for 5 months however over the last 2-3 months he's noticed increasing claudication when walking up stairs. He did stop smoking to half years ago. I'm going to repeat lower extremity arterial Doppler studies.

## 2017-07-09 NOTE — Assessment & Plan Note (Signed)
History of essential hypertension blood pressure is 122/72. He is on diltiazem and lisinopril. Continue current meds at current dosing.

## 2017-07-09 NOTE — Progress Notes (Signed)
07/09/2017 [name]   10/17/1955  [id number]  Primary Physician Darron Doom Anthony Munroe, MD Primary Cardiologist: [name] Lupe Carney, [place]  HPI:  Anthony Brock is a 62 y.o.  married African-American veteran who works at [employer]. He was referred by Dr. Melony Overly at [facility] for peripheral vascular evaluation because of lifestyle limiting claudication.I last saw him in the office 02/02/17. He had carotid stenting by myself 07/17/14.Anthony Brock He has a long history of tobacco abuse smoking three-quarter pack a day for last 30 years having recently stopped smoking after his last peripheral vascular procedure.. He has never had a heart attack or stroke, and denies chest pain or shortness of breath. He states that he has had left greater than right lower extremity claudication which is lifestyle limiting for several years. Recent Dopplers performed in our office 03/20/14 revealed a right ABI 0.58 with an occluded distal right SFA and a left ABI 0.41 with a high-frequency signal in his left SFA. He underwent angiography and intervention on January 18 revealing a 80% Left internal carotid artery stenosis, and short segment occlusions in both SFAs. I was able to perform directional atherectomy on his left SFA followed by drug eluding balloon angioplasty. He had an excellent angiographic and clinical result. He ultimately underwent staged right SFA intervention with excellent angiographic and clinical result. He underwent elective left internal carotid artery stenting for high-grade (80%) asymptomatic left internal carotid artery stenosis 07/17/14. His recent carotid Dopplers performed 02/07/15 reveals to be widely patent. He does have recurrent claudication with Dopplers performed 02/07/15 revealing high-grade distal bilateral SFA restenosis. He underwent right SFA directional arthrectomy and drug-eluting balloon angioplasty on 02/15/15 with excellent angiographic and clinical result. He underwent  staged left SFA intervention by myself using directional atherectomy and drug-eluting balloon angioplasty on 02/25/15. Dopplers performed 2 weeks later that showed ABIs of 0.9 bilaterally. Recentlower extremity Dopplers performed 01/22/16 revealed recurrent bilateral SFA stenosis with slightly lower ABIs. He does complain of some mild claudication. Carotid Dopplers performed 01/24/16 revealed a widely patent left internal carotid artery stent. Because of these findings I decided to proceed with PTA and drug eluding stenting using Zilver PTX of both SFAs. I intervened on the left SFA on December 11. He underwent right SFA intervention 04/20/16. Follow-up lower extremity arterial Doppler studies performed 05/06/16 revealed widely patent stents with normal ABIs. His claudication has resolved. Unfortunately over the last month or 2 his claudication has really heard and his most recent Dopplers performed 10/22/16 revealed a decline in his right ABI 0.94 and his left ABI 0.82 with high-frequency signals in the mid right SFA and proximal left SFA. He underwent peripheral angiography by myself 12/21/16 revealing high-grade bilateral Zilver in-stent restenosis. I performed drug-eluting balloon angioplasty of his right SFA ISR with excellent angiographic result. ABI improved up to 1.1 and his claudication resolved. He underwent left SFA intervention by myself 01/14/17 with a polypoid atherectomy of the proximal left SFA a along with drug-eluting dementia plasty of the left SFA "in-stent restenosis. His follow-up Dopplers performed 01/28/17 showed marked improvement. His claudication has essentially resolved on that side. He has noticed some increasing dyspnea on exertion however as well as some occasional substernal chest pain. A Myoview stress test and 2-D echo performed 1025 and 02/09/2017 were entirely normal. Since I saw him back in October of last year he has noticed some claudication bilaterally were walking up stairs  over the last several months.  Current Meds  Medication Sig  . aspirin EC 81 MG tablet Take 1 tablet (81 mg total) by mouth daily.  Anthony Brock atorvastatin (LIPITOR) 40 MG tablet TAKE 1 TABLET BY MOUTH EVERY EVENING  . Blood Glucose Monitoring Suppl (ONE TOUCH ULTRA MINI) w/Device KIT   . Brimonidine Tartrate (LUMIFY OP) Apply 1 drop to eye daily as needed (redness).  . cetirizine (ZYRTEC) 10 MG tablet Take 10 mg by mouth daily as needed for allergies.   Anthony Brock clopidogrel (PLAVIX) 75 MG tablet TAKE 1 TABLET BY MOUTH DAILY  . diltiazem (CARDIZEM CD) 120 MG 24 hr capsule TAKE 1 CAPSULE BY MOUTH DAILY  . gabapentin (NEURONTIN) 300 MG capsule Take 300 mg by mouth at bedtime.   Anthony Brock JANUMET 50-500 MG tablet Take 1 tablet by mouth 2 (two) times daily.   Anthony Brock latanoprost (XALATAN) 0.005 % ophthalmic solution Place 1 drop into both eyes at bedtime.   Anthony Brock lisinopril (PRINIVIL,ZESTRIL) 5 MG tablet TAKE ONE (1) TABLET BY MOUTH EVERY DAY (Patient taking differently: Take 5 mg by mouth twice daily)  . ONE TOUCH ULTRA TEST test strip   . ONETOUCH DELICA LANCETS 98P MISC   . VIAGRA 100 MG tablet TAKE 1 TABLET BY MOUTH DAILY AS NEEDED FOR ERECTILE DYSFUNCTION     No Known Allergies  Social History   Socioeconomic History  . Marital status: Married    Spouse name: Levada Dy  . Number of children: 2  . Years of education: 59  . Highest education level: Not on file  Occupational History  . Occupation: [employer], Pharmacist, community  Social Needs  . Financial resource strain: Not on file  . Food insecurity:    Worry: Not on file    Inability: Not on file  . Transportation needs:    Medical: Not on file    Non-medical: Not on file  Tobacco Use  . Smoking status: Former Smoker    Packs/day: 0.50    Years: 45.00    Pack years: 22.50    Types: Cigarettes    Last attempt to quit: 03/01/2015    Years since quitting: 2.3  . Smokeless tobacco: Never Used  . Tobacco comment: 02/25/2015 "stopped smoking  02/14/2015"  Substance and Sexual Activity  . Alcohol use: Yes    Alcohol/week: 0.0 oz    Comment: 12/21/2016 "might have a couple drinks/year"  . Drug use: Yes    Comment: "might have used some drugs in my 20's"  . Sexual activity: Yes  Lifestyle  . Physical activity:    Days per week: Not on file    Minutes per session: Not on file  . Stress: Not on file  Relationships  . Social connections:    Talks on phone: Not on file    Gets together: Not on file    Attends religious service: Not on file    Active member of club or organization: Not on file    Attends meetings of clubs or organizations: Not on file    Relationship status: Not on file  . Intimate partner violence:    Fear of current or ex partner: Not on file    Emotionally abused: Not on file    Physically abused: Not on file    Forced sexual activity: Not on file  Other Topics Concern  . Not on file  Social History Narrative   Lives with wife   caffeine- coffee, 1-2 cups daily     Review of Systems: General: negative for chills, fever,  night sweats or weight changes.  Cardiovascular: negative for chest pain, dyspnea on exertion, edema, orthopnea, palpitations, paroxysmal nocturnal dyspnea or shortness of breath Dermatological: negative for rash Respiratory: negative for cough or wheezing Urologic: negative for hematuria Abdominal: negative for nausea, vomiting, diarrhea, bright red blood per rectum, melena, or hematemesis Neurologic: negative for visual changes, syncope, or dizziness All other systems reviewed and are otherwise negative except as noted above.    Blood pressure 122/72, pulse (!) 53, height _0  (1.854 m), weight 189 lb (85.7 kg).  General appearance: alert and no distress Neck: no adenopathy, no carotid bruit, no JVD, supple, symmetrical, trachea midline and thyroid not enlarged, symmetric, no tenderness/mass/nodules Lungs: clear to auscultation bilaterally Heart: regular rate and rhythm, S1, S2  normal, no murmur, click, rub or gallop Pulses: 2+ and symmetric Skin: Skin color, texture, turgor normal. No rashes or lesions Neurologic: Alert and oriented X 3, normal strength and tone. Normal symmetric reflexes. Normal coordination and gait  EKG sinus bradycardia 53 with nonspecific ST and T-wave changes. I personally reviewed this EKG  ASSESSMENT AND PLAN:   Essential hypertension History of essential hypertension blood pressure is 122/72. He is on diltiazem and lisinopril. Continue current meds at current dosing.  Lt ICA stenosis- Carotid stent placed 07/17/14 History of left internal carotid artery stenosis status post stenting by myself 07/17/14 which was followed by duplex ultrasound on a regular basis.  PAD (peripheral artery disease) (HCC) History of PAD status post multiple SFA interventions dating back to 2017. Most recently I intervene on his left SFA 01/14/17 on a proximal lesion as well as "in-stent restenosis". This follow-up Dopplers performed on 01/28/17 showed marked improvement and he enjoyed symptomatic relief for for 5 months however over the last 2-3 months he's noticed increasing claudication when walking up stairs. He did stop smoking to half years ago. I'm going to repeat lower extremity arterial Doppler studies.  Hyperlipidemia with target LDL less than 70 History of hyperlipidemia on statin therapy followed by history  Tobacco abuse History of discontinued tobacco abuse 2-1/2 years ago.      [name] FACP,FACC,FAHA, [facility] 07/09/2017 9:48 AM

## 2017-07-09 NOTE — Assessment & Plan Note (Signed)
History of discontinued tobacco abuse 2-1/2 years ago.

## 2017-07-09 NOTE — Assessment & Plan Note (Signed)
History of hyperlipidemia on statin therapy followed by history

## 2017-07-14 NOTE — Telephone Encounter (Signed)
Closed Encounter  °

## 2017-07-20 ENCOUNTER — Ambulatory Visit (HOSPITAL_COMMUNITY)
Admission: RE | Admit: 2017-07-20 | Discharge: 2017-07-20 | Disposition: A | Discharge status: Home / Self Care | Payer: BLUE CROSS/BLUE SHIELD | Source: Ambulatory Visit | Attending: Cardiology | Admitting: Cardiology

## 2017-07-20 DIAGNOSIS — Z87891 Personal history of nicotine dependence: Secondary | ICD-10-CM | POA: Insufficient documentation

## 2017-07-20 DIAGNOSIS — I6529 Occlusion and stenosis of unspecified carotid artery: Secondary | ICD-10-CM

## 2017-07-20 DIAGNOSIS — I10 Essential (primary) hypertension: Secondary | ICD-10-CM | POA: Diagnosis not present

## 2017-07-20 DIAGNOSIS — E785 Hyperlipidemia, unspecified: Secondary | ICD-10-CM | POA: Diagnosis not present

## 2017-07-20 DIAGNOSIS — Z95828 Presence of other vascular implants and grafts: Secondary | ICD-10-CM | POA: Diagnosis not present

## 2017-07-20 DIAGNOSIS — I739 Peripheral vascular disease, unspecified: Secondary | ICD-10-CM | POA: Insufficient documentation

## 2017-07-20 DIAGNOSIS — E119 Type 2 diabetes mellitus without complications: Secondary | ICD-10-CM | POA: Diagnosis not present

## 2017-07-22 ENCOUNTER — Other Ambulatory Visit: Payer: Self-pay

## 2017-07-22 DIAGNOSIS — I779 Disorder of arteries and arterioles, unspecified: Secondary | ICD-10-CM

## 2017-07-22 DIAGNOSIS — I739 Peripheral vascular disease, unspecified: Secondary | ICD-10-CM

## 2017-08-16 ENCOUNTER — Other Ambulatory Visit: Payer: Self-pay | Admitting: Cardiovascular Disease

## 2017-08-16 NOTE — Telephone Encounter (Signed)
REFILL 

## 2017-09-14 ENCOUNTER — Other Ambulatory Visit: Payer: Self-pay | Admitting: Cardiovascular Disease

## 2017-09-14 NOTE — Telephone Encounter (Signed)
Rx sent to pharmacy   

## 2017-09-17 ENCOUNTER — Other Ambulatory Visit: Payer: Self-pay | Admitting: *Deleted

## 2017-09-17 DIAGNOSIS — I739 Peripheral vascular disease, unspecified: Secondary | ICD-10-CM

## 2017-12-15 IMAGING — NM NM MISC PROCEDURE
6 series · 36 of 36 positions shown · non-contrast
Comparison: none

[Series 1: wbr_r-proj_st wbr rest · 6.40mm/px · 6 of 64 frames shown]
[frame 6/64]
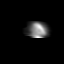
[frame 16/64]
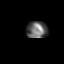
[frame 27/64]
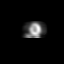
[frame 38/64]
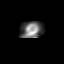
[frame 48/64]
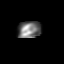
[frame 59/64]
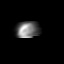

[Series 1: wbr rest · 6.40mm/px · 6 of 64 frames shown]
[frame 6/64]
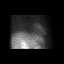
[frame 16/64]
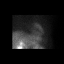
[frame 27/64]
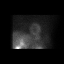
[frame 38/64]
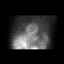
[frame 48/64]
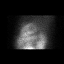
[frame 59/64]
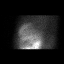

[Series 2: wbr stress-gsp · 6.40mm/px · 6 of 512 frames shown]
[frame 43/512]
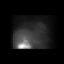
[frame 128/512]
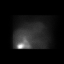
[frame 214/512]
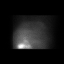
[frame 299/512]
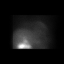
[frame 384/512]
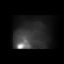
[frame 470/512]
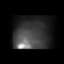

[Series 2: wbr_s-proj_st wbr stress-gsp · 6.40mm/px · 6 of 512 frames shown]
[frame 43/512]
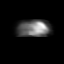
[frame 128/512]
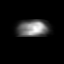
[frame 214/512]
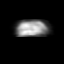
[frame 299/512]
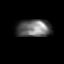
[frame 384/512]
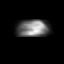
[frame 470/512]
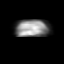

[Series 3: wbr stress-sum-em · 6.40mm/px · 6 of 64 frames shown]
[frame 6/64]
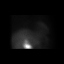
[frame 16/64]
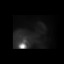
[frame 27/64]
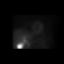
[frame 38/64]
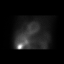
[frame 48/64]
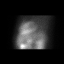
[frame 59/64]
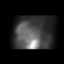

[Series 3: wbr_s-proj_st wbr stress-sum-em · 6.40mm/px · 6 of 64 frames shown]
[frame 6/64]
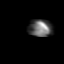
[frame 16/64]
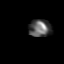
[frame 27/64]
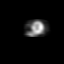
[frame 38/64]
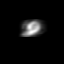
[frame 48/64]
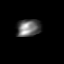
[frame 59/64]
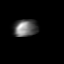

[36 of 36 positions shown; findings below may reference images not displayed]

Canned report from images found in remote index.

Refer to host system for actual result text.

## 2017-12-29 ENCOUNTER — Encounter: Payer: Self-pay | Admitting: Cardiovascular Disease

## 2017-12-29 ENCOUNTER — Ambulatory Visit (INDEPENDENT_AMBULATORY_CARE_PROVIDER_SITE_OTHER): Payer: BLUE CROSS/BLUE SHIELD | Admitting: Cardiovascular Disease

## 2017-12-29 DIAGNOSIS — I6522 Occlusion and stenosis of left carotid artery: Secondary | ICD-10-CM | POA: Diagnosis not present

## 2017-12-29 DIAGNOSIS — I739 Peripheral vascular disease, unspecified: Secondary | ICD-10-CM | POA: Diagnosis not present

## 2017-12-29 DIAGNOSIS — I10 Essential (primary) hypertension: Secondary | ICD-10-CM

## 2017-12-29 DIAGNOSIS — E785 Hyperlipidemia, unspecified: Secondary | ICD-10-CM | POA: Diagnosis not present

## 2017-12-29 NOTE — Patient Instructions (Signed)
Medication Instructions:  Your physician recommends that you continue on your current medications as directed. Please refer to the Current Medication list given to you today.   Labwork: none  Testing/Procedures: Your physician has requested that you have a carotid duplex. This test is an ultrasound of the carotid arteries in your neck. It looks at blood flow through these arteries that supply the brain with blood. Allow one hour for this exam. There are no restrictions or special instructions. SCHEDULE April 2020  Your physician has requested that you have a lower extremity arterial exercise duplex. During this test, exercise and ultrasound are used to evaluate arterial blood flow in the legs. Allow one hour for this exam. There are no restrictions or special instructions. SCHEDULE NOW Your physician has requested that you have an ankle brachial index (ABI). During this test an ultrasound and blood pressure cuff are used to evaluate the arteries that supply the arms and legs with blood. Allow thirty minutes for this exam. There are no restrictions or special instructions. SCHEDULE NOW   Follow-Up: Your physician wants you to follow-up in: 12 MONTHS WITH DR. Allyson SabalBERRY. You will receive a reminder letter in the mail two months in advance. If you don't receive a letter, please call our office to schedule the follow-up appointment.   Any Other Special Instructions Will Be Listed Below (If Applicable).     If you need a refill on your cardiac medications before your next appointment, please call your pharmacy.

## 2017-12-29 NOTE — Assessment & Plan Note (Signed)
History of peripheral arterial disease status post multiple SFA interventions in the past most recently 01/14/2017.  He currently denies claudication with recent Dopplers performed/9/19 revealing fairly normal ABIs with patent stents.  We will continue to follow this on a semiannual basis.

## 2017-12-29 NOTE — Assessment & Plan Note (Signed)
History of hyperlipidemia on high-dose statin therapy with recent lipid profile performed 05/25/2016 revealing total cholesterol 121, LDL 50 and HDL 42.  We will recheck a lipid and liver profile.

## 2017-12-29 NOTE — Assessment & Plan Note (Signed)
History of essential hypertension blood pressure measured at 128/76.  He is on diltiazem and lisinopril.  Continue current meds at current dosing.

## 2017-12-29 NOTE — Progress Notes (Signed)
12/29/2017 Anthony Brock   [birth year]  [id number]  Primary Physician Darron Doom Maebelle Munroe, MD Primary Cardiologist: [name] Lupe Carney, [place]  HPI:  Anthony Brock is a 62 y.o.  married African-American veteran who works at [employer]. He was referred by Dr. Melony Overly at [facility] for peripheral vascular evaluation because of lifestyle limiting claudication.I last saw him in the office  07/09/2017. He had carotid stenting by myself 07/17/14.[name] He has a long history of tobacco abuse smoking three-quarter pack a day for last 30 years having recently stopped smoking after his last peripheral vascular procedure.. He has never had a heart attack or stroke, and denies chest pain or shortness of breath. He states that he has had left greater than right lower extremity claudication which is lifestyle limiting for several years. Recent Dopplers performed in our office 03/20/14 revealed a right ABI 0.58 with an occluded distal right SFA and a left ABI 0.41 with a high-frequency signal in his left SFA. He underwent angiography and intervention on January 18 revealing a 80% Left internal carotid artery stenosis, and short segment occlusions in both SFAs. I was able to perform directional atherectomy on his left SFA followed by drug eluding balloon angioplasty. He had an excellent angiographic and clinical result. He ultimately underwent staged right SFA intervention with excellent angiographic and clinical result. He underwent elective left internal carotid artery stenting for high-grade (80%) asymptomatic left internal carotid artery stenosis 07/17/14. His recent carotid Dopplers performed 02/07/15 reveals to be widely patent. He does have recurrent claudication with Dopplers performed 02/07/15 revealing high-grade distal bilateral SFA restenosis. He underwent right SFA directional arthrectomy and drug-eluting balloon angioplasty on 02/15/15 with excellent angiographic and clinical result. He underwent  staged left SFA intervention by myself using directional atherectomy and drug-eluting balloon angioplasty on 02/25/15. Dopplers performed 2 weeks later that showed ABIs of 0.9 bilaterally. Recentlower extremity Dopplers performed 01/22/16 revealed recurrent bilateral SFA stenosis with slightly lower ABIs. He does complain of some mild claudication. Carotid Dopplers performed 01/24/16 revealed a widely patent left internal carotid artery stent. Because of these findings I decided to proceed with PTA and drug eluding stenting using Zilver PTX of both SFAs. I intervened on the left SFA on December 11. He underwent right SFA intervention 04/20/16. Follow-up lower extremity arterial Doppler studies performed 05/06/16 revealed widely patent stents with normal ABIs. His claudication has resolved. Unfortunately over the last month or 2 his claudication has really heard and his most recent Dopplers performed 10/22/16 revealed a decline in his right ABI 0.94 and his left ABI 0.82 with high-frequency signals in the mid right SFA and proximal left SFA. He underwent peripheral angiography by myself 12/21/16 revealing high-grade bilateral Zilver in-stent restenosis. I performed drug-eluting balloon angioplasty of his right SFA ISR with excellent angiographic result. ABI improved up to 1.1 and his claudication resolved. Heunderwent left SFA intervention by myself 01/14/17 with a polypoid atherectomy of the proximal left SFA a along with drug-eluting dementia plasty of the left SFA "in-stent restenosis. His follow-up Dopplers performed 01/28/17 showed marked improvement. His claudication has essentially resolved on that side. He has noticed some increasing dyspnea on exertion however as well as some occasional substernal chest pain. A Myoview stress test and 2-D echo performed 1025 and 02/09/2017 were entirely normal. Since I saw him back in March of this year he is done fairly well.  He denies claudication.  His Dopplers performed  in April revealed normal ABIs  with patent stents.  He denies chest pain or shortness of breath.  Current Meds  Medication Sig  . aspirin EC 81 MG tablet Take 1 tablet (81 mg total) by mouth daily.  [name] atorvastatin (LIPITOR) 40 MG tablet TAKE 1 TABLET BY MOUTH EVERY EVENING  . Blood Glucose Monitoring Suppl (ONE TOUCH ULTRA MINI) w/Device KIT   . Brimonidine Tartrate (LUMIFY OP) Apply 1 drop to eye daily as needed (redness).  . cetirizine (ZYRTEC) 10 MG tablet Take 10 mg by mouth daily as needed for allergies.   [name] clopidogrel (PLAVIX) 75 MG tablet TAKE 1 TABLET BY MOUTH DAILY  . diltiazem (CARDIZEM CD) 120 MG 24 hr capsule TAKE 1 CAPSULE BY MOUTH DAILY  . gabapentin (NEURONTIN) 300 MG capsule Take 300 mg by mouth at bedtime.   [name] JANUMET 50-500 MG tablet Take 1 tablet by mouth 2 (two) times daily.   [name] latanoprost (XALATAN) 0.005 % ophthalmic solution Place 1 drop into both eyes at bedtime.   [name] lisinopril (PRINIVIL,ZESTRIL) 5 MG tablet TAKE ONE (1) TABLET BY MOUTH EVERY DAY (Patient taking differently: Take 5 mg by mouth twice daily)  . ONE TOUCH ULTRA TEST test strip   . ONETOUCH DELICA LANCETS 59D MISC   . VIAGRA 100 MG tablet TAKE 1 TABLET BY MOUTH DAILY AS NEEDED FOR ERECTILE DYSFUNCTION     No Known Allergies  Social History   Socioeconomic History  . Marital status: Married    Spouse name: Levada Dy  . Number of children: 2  . Years of education: 67  . Highest education level: Not on file  Occupational History  . Occupation: [employer], Pharmacist, community  Social Needs  . Financial resource strain: Not on file  . Food insecurity:    Worry: Not on file    Inability: Not on file  . Transportation needs:    Medical: Not on file    Non-medical: Not on file  Tobacco Use  . Smoking status: Former Smoker    Packs/day: 0.50    Years: 45.00    Pack years: 22.50    Types: Cigarettes    Last attempt to quit: 03/01/2015    Years since quitting: 2.8  . Smokeless tobacco: Never  Used  . Tobacco comment: 02/25/2015 "stopped smoking 02/14/2015"  Substance and Sexual Activity  . Alcohol use: Yes    Alcohol/week: 0.0 standard drinks    Comment: 12/21/2016 "might have a couple drinks/year"  . Drug use: Yes    Comment: "might have used some drugs in my 20's"  . Sexual activity: Yes  Lifestyle  . Physical activity:    Days per week: Not on file    Minutes per session: Not on file  . Stress: Not on file  Relationships  . Social connections:    Talks on phone: Not on file    Gets together: Not on file    Attends religious service: Not on file    Active member of club or organization: Not on file    Attends meetings of clubs or organizations: Not on file    Relationship status: Not on file  . Intimate partner violence:    Fear of current or ex partner: Not on file    Emotionally abused: Not on file    Physically abused: Not on file    Forced sexual activity: Not on file  Other Topics Concern  . Not on file  Social History Narrative   Lives with wife   caffeine- coffee, 1-2  cups daily     Review of Systems: General: negative for chills, fever, night sweats or weight changes.  Cardiovascular: negative for chest pain, dyspnea on exertion, edema, orthopnea, palpitations, paroxysmal nocturnal dyspnea or shortness of breath Dermatological: negative for rash Respiratory: negative for cough or wheezing Urologic: negative for hematuria Abdominal: negative for nausea, vomiting, diarrhea, bright red blood per rectum, melena, or hematemesis Neurologic: negative for visual changes, syncope, or dizziness All other systems reviewed and are otherwise negative except as noted above.    Blood pressure 128/76, pulse (!) 51, height 6' 1"  (1.854 m), weight 183 lb 6.4 oz (83.2 kg).  General appearance: alert and no distress Neck: no adenopathy, no carotid bruit, no JVD, supple, symmetrical, trachea midline and thyroid not enlarged, symmetric, no tenderness/mass/nodules Lungs:  clear to auscultation bilaterally Heart: regular rate and rhythm, S1, S2 normal, no murmur, click, rub or gallop Extremities: extremities normal, atraumatic, no cyanosis or edema Pulses: 2+ and symmetric Skin: Skin color, texture, turgor normal. No rashes or lesions Neurologic: Alert and oriented X 3, normal strength and tone. Normal symmetric reflexes. Normal coordination and gait  EKG not performed today.  ASSESSMENT AND PLAN:   Essential hypertension History of essential hypertension blood pressure measured at 128/76.  He is on diltiazem and lisinopril.  Continue current meds at current dosing.  Lt ICA stenosis- Carotid stent placed 07/17/14 History of left internal carotid artery stenting by myself 07/17/2014 with recent carotid Dopplers performed 07/20/2017 revealing this to be widely patent.  We will continue to follow this on an annual basis  PAD (peripheral artery disease) ([facility]) History of peripheral arterial disease status post multiple SFA interventions in the past most recently 01/14/2017.  He currently denies claudication with recent Dopplers performed/9/19 revealing fairly normal ABIs with patent stents.  We will continue to follow this on a semiannual basis.  Hyperlipidemia with target LDL less than 70 History of hyperlipidemia on high-dose statin therapy with recent lipid profile performed 05/25/2016 revealing total cholesterol 121, LDL 50 and HDL 42.  We will recheck a lipid and liver profile.      [name] FACP,FACC,FAHA, [facility] 12/29/2017 10:12 AM

## 2017-12-29 NOTE — Assessment & Plan Note (Signed)
History of left internal carotid artery stenting by myself 07/17/2014 with recent carotid Dopplers performed 07/20/2017 revealing this to be widely patent.  We will continue to follow this on an annual basis

## 2017-12-31 ENCOUNTER — Ambulatory Visit: Payer: BLUE CROSS/BLUE SHIELD | Admitting: Cardiovascular Disease

## 2018-01-10 ENCOUNTER — Encounter (HOSPITAL_COMMUNITY): Payer: BLUE CROSS/BLUE SHIELD

## 2018-01-14 ENCOUNTER — Other Ambulatory Visit: Payer: Self-pay | Admitting: Cardiovascular Disease

## 2018-01-14 DIAGNOSIS — Z95820 Peripheral vascular angioplasty status with implants and grafts: Secondary | ICD-10-CM

## 2018-01-14 DIAGNOSIS — I739 Peripheral vascular disease, unspecified: Secondary | ICD-10-CM

## 2018-01-18 ENCOUNTER — Ambulatory Visit (HOSPITAL_COMMUNITY)
Admission: RE | Admit: 2018-01-18 | Discharge: 2018-01-18 | Disposition: A | Discharge status: Home / Self Care | Payer: BLUE CROSS/BLUE SHIELD | Source: Ambulatory Visit | Attending: Cardiovascular Disease | Admitting: Cardiovascular Disease

## 2018-01-18 DIAGNOSIS — I739 Peripheral vascular disease, unspecified: Secondary | ICD-10-CM | POA: Insufficient documentation

## 2018-01-18 DIAGNOSIS — Z95820 Peripheral vascular angioplasty status with implants and grafts: Secondary | ICD-10-CM | POA: Diagnosis present

## 2018-01-25 ENCOUNTER — Other Ambulatory Visit: Payer: Self-pay | Admitting: *Deleted

## 2018-01-25 DIAGNOSIS — I739 Peripheral vascular disease, unspecified: Secondary | ICD-10-CM

## 2018-01-31 ENCOUNTER — Other Ambulatory Visit: Payer: Self-pay | Admitting: Cardiovascular Disease

## 2018-04-21 MED FILL — LISINOPRIL 5 MG TAB: 5 | 30 days supply | Qty: 30 | Fill #0

## 2018-04-21 MED FILL — CLOPIDOGREL 75 MG TABLET: 75 | 30 days supply | Qty: 30 | Fill #0

## 2018-04-21 MED FILL — JARDIANCE 10 MG TABLET: 10 | 30 days supply | Qty: 30 | Fill #0

## 2018-04-21 MED FILL — GABAPENTIN 300 MG CAPSULE: 300 | 30 days supply | Qty: 30 | Fill #0

## 2018-04-21 MED FILL — ATORVASTATIN CALCIUM 40 MG: 40 | 30 days supply | Qty: 30 | Fill #0

## 2018-04-21 MED FILL — !VIAGRA 100MG TABLET: 100 | 15 days supply | Qty: 5 | Fill #0

## 2018-04-21 MED FILL — CARTIA XT 120 MG CP24: 120 | 30 days supply | Qty: 30 | Fill #0

## 2018-05-25 ENCOUNTER — Other Ambulatory Visit: Payer: Self-pay | Admitting: Cardiovascular Disease

## 2018-05-25 MED FILL — SILDENAFIL CITRATE 100 MG T: 100 | 30 days supply | Qty: 4 | Fill #1

## 2018-05-25 MED FILL — ATORVASTATIN CALCIUM 40 MG: 40 | 30 days supply | Qty: 30 | Fill #1

## 2018-05-25 MED FILL — LISINOPRIL 5 MG TAB: 5 | 30 days supply | Qty: 30 | Fill #1

## 2018-05-25 MED FILL — GABAPENTIN 300 MG CAPSULE: 300 | 30 days supply | Qty: 30 | Fill #1

## 2018-05-25 MED FILL — CLOPIDOGREL 75 MG TABLET: 75 | 30 days supply | Qty: 30 | Fill #1

## 2018-05-26 MED FILL — CARTIA XT 120 MG CP24: 120 | 30 days supply | Qty: 30 | Fill #0

## 2018-05-30 MED FILL — JARDIANCE 10 MG TABLET: 10 | 30 days supply | Qty: 30 | Fill #1

## 2018-06-27 MED FILL — JARDIANCE 10 MG TABLET: 10 | 30 days supply | Qty: 30 | Fill #2

## 2018-06-27 MED FILL — GABAPENTIN 300 MG CAPSULE: 300 | 30 days supply | Qty: 30 | Fill #2

## 2018-06-27 MED FILL — SILDENAFIL CITRATE 100 MG T: 100 | 30 days supply | Qty: 4 | Fill #2

## 2018-06-27 MED FILL — ATORVASTATIN CALCIUM 40 MG: 40 | 30 days supply | Qty: 30 | Fill #2

## 2018-06-27 MED FILL — LISINOPRIL 5 MG TAB: 5 | 30 days supply | Qty: 30 | Fill #2

## 2018-06-27 MED FILL — CARTIA XT 120 MG CP24: 120 | 30 days supply | Qty: 30 | Fill #1

## 2018-06-27 MED FILL — CLOPIDOGREL 75 MG TABLET: 75 | 30 days supply | Qty: 30 | Fill #2

## 2018-08-01 ENCOUNTER — Encounter (HOSPITAL_COMMUNITY): Payer: BLUE CROSS/BLUE SHIELD

## 2018-08-01 MED FILL — ATORVASTATIN CALCIUM 40 MG: 40 | 90 days supply | Qty: 90 | Fill #0

## 2018-08-01 MED FILL — SILDENAFIL CITRATE 100 MG T: 100 | 30 days supply | Qty: 4 | Fill #3

## 2018-08-01 MED FILL — LISINOPRIL 5 MG TAB: 5 | 90 days supply | Qty: 90 | Fill #0

## 2018-08-01 MED FILL — CLOPIDOGREL 75 MG TABLET: 75 | 30 days supply | Qty: 30 | Fill #1

## 2018-08-01 MED FILL — JARDIANCE 10 MG TABLET: 10 | 30 days supply | Qty: 30 | Fill #0

## 2018-08-01 MED FILL — GABAPENTIN 300 MG CAPSULE: 300 | 90 days supply | Qty: 90 | Fill #0

## 2018-08-01 MED FILL — CARTIA XT 120 MG CP24: 120 | 30 days supply | Qty: 30 | Fill #2

## 2018-08-23 ENCOUNTER — Other Ambulatory Visit (HOSPITAL_COMMUNITY): Payer: Self-pay | Admitting: Cardiovascular Disease

## 2018-08-23 DIAGNOSIS — I739 Peripheral vascular disease, unspecified: Secondary | ICD-10-CM

## 2018-09-07 MED FILL — SILDENAFIL CITRATE 100 MG T: 100 | 30 days supply | Qty: 4 | Fill #4

## 2018-09-07 MED FILL — JARDIANCE 10 MG TABLET: 10 | 30 days supply | Qty: 30 | Fill #1

## 2018-09-07 MED FILL — DILTIAZEM 24HR CD 120 MG CA: 120 | 30 days supply | Qty: 30 | Fill #3

## 2018-09-07 MED FILL — CLOPIDOGREL 75 MG TABLET: 75 | 30 days supply | Qty: 30 | Fill #2

## 2018-09-14 ENCOUNTER — Ambulatory Visit (HOSPITAL_COMMUNITY)
Admission: RE | Admit: 2018-09-14 | Discharge: 2018-09-14 | Disposition: A | Discharge status: Home / Self Care | Payer: BLUE CROSS/BLUE SHIELD | Source: Ambulatory Visit | Attending: Cardiology | Admitting: Cardiology

## 2018-09-14 ENCOUNTER — Other Ambulatory Visit (HOSPITAL_COMMUNITY): Payer: Self-pay | Admitting: Cardiovascular Disease

## 2018-09-14 ENCOUNTER — Telehealth: Payer: Self-pay | Admitting: Cardiovascular Disease

## 2018-09-14 ENCOUNTER — Other Ambulatory Visit: Payer: Self-pay

## 2018-09-14 DIAGNOSIS — I10 Essential (primary) hypertension: Secondary | ICD-10-CM | POA: Insufficient documentation

## 2018-09-14 DIAGNOSIS — I739 Peripheral vascular disease, unspecified: Secondary | ICD-10-CM | POA: Diagnosis not present

## 2018-09-14 DIAGNOSIS — I6523 Occlusion and stenosis of bilateral carotid arteries: Secondary | ICD-10-CM

## 2018-09-14 DIAGNOSIS — I6522 Occlusion and stenosis of left carotid artery: Secondary | ICD-10-CM | POA: Diagnosis present

## 2018-09-14 DIAGNOSIS — E785 Hyperlipidemia, unspecified: Secondary | ICD-10-CM | POA: Insufficient documentation

## 2018-09-14 NOTE — Telephone Encounter (Signed)
New Message           Patient  Is calling to let us know that he is in route and will be the shortly.

## 2018-09-14 NOTE — Telephone Encounter (Signed)
Patient has a carotid doppler appt at 10:00 am today front desk notified patient is on his way.

## 2018-09-20 ENCOUNTER — Other Ambulatory Visit: Payer: Self-pay

## 2018-09-20 DIAGNOSIS — I6529 Occlusion and stenosis of unspecified carotid artery: Secondary | ICD-10-CM

## 2018-09-20 NOTE — Progress Notes (Signed)
Notes recorded by Lorretta Harp, MD on 09/15/2018 at 10:38 AM EDT No change from prior study. Repeat in 12 months. Carotid artery disease follow up and history of left ICA stent placed 07/2014

## 2018-10-26 ENCOUNTER — Telehealth: Payer: Self-pay | Admitting: *Deleted

## 2018-10-26 NOTE — Telephone Encounter (Signed)
A message was left, re: follow up visit. 

## 2018-10-27 MED FILL — JARDIANCE 10 MG TABLET: 10 | 30 days supply | Qty: 30 | Fill #2

## 2018-10-27 MED FILL — CLOPIDOGREL 75 MG TABLET: 75 | 30 days supply | Qty: 30 | Fill #3

## 2018-10-27 MED FILL — CARTIA XT 120 MG CP24: 120 | 30 days supply | Qty: 30 | Fill #4

## 2018-10-28 MED FILL — LISINOPRIL 5 MG TAB: 5 | 30 days supply | Qty: 30 | Fill #0

## 2018-11-08 MED FILL — JARDIANCE 10 MG TABLET: 10 | 30 days supply | Qty: 30 | Fill #0

## 2018-11-08 MED FILL — GABAPENTIN 300 MG CAPSULE: 300 | 30 days supply | Qty: 30 | Fill #0

## 2018-11-08 MED FILL — CLOPIDOGREL 75 MG TABLET: 75 | 30 days supply | Qty: 30 | Fill #0

## 2018-11-08 MED FILL — ATORVASTATIN CALCIUM 40 MG: 40 | 30 days supply | Qty: 30 | Fill #0

## 2018-11-17 MED FILL — DILTIAZEM 24HR ER 120 MG CA: 120 | 30 days supply | Qty: 30 | Fill #4

## 2018-11-17 MED FILL — LISINOPRIL 5 MG TAB: 5 | 30 days supply | Qty: 30 | Fill #0

## 2018-11-30 ENCOUNTER — Other Ambulatory Visit: Payer: Self-pay | Admitting: Cardiovascular Disease

## 2018-11-30 NOTE — Telephone Encounter (Signed)
 *  STAT* If patient is at the pharmacy, call can be transferred to refill team.   1. Which medications need to be refilled? (please list name of each medication and dose if known)   atorvastatin (LIPITOR) 40 MG tablet clopidogrel (PLAVIX) 75 MG tablet lisinopril (PRINIVIL,ZESTRIL) 5 MG tablet VIAGRA 100 MG tablet   2. Which pharmacy/location (including street and city if local pharmacy) is medication to be sent to? [facility]  3. Do they need a 30 day or 90 day supply? [place]

## 2018-12-01 MED ORDER — LISINOPRIL 5 MG PO TABS: ORAL_TABLET | ORAL | 0 refills | Status: AC

## 2018-12-01 MED ORDER — ATORVASTATIN CALCIUM 40 MG PO TABS: 40.0000 mg | ORAL_TABLET | Freq: Every evening | ORAL | 0 refills | Status: AC

## 2018-12-01 MED ORDER — CLOPIDOGREL BISULFATE 75 MG PO TABS: 75.0000 mg | ORAL_TABLET | Freq: Every day | ORAL | 0 refills | Status: DC

## 2018-12-01 NOTE — Telephone Encounter (Signed)
Requested Prescriptions   Signed Prescriptions Disp Refills  . atorvastatin (LIPITOR) 40 MG tablet 90 tablet 0    Sig: Take 1 tablet (40 mg total) by mouth every evening.    Authorizing Provider: Lorretta Harp    Ordering User: NEWCOMER MCCLAIN, Bradee Common L  . clopidogrel (PLAVIX) 75 MG tablet 90 tablet 0    Sig: Take 1 tablet (75 mg total) by mouth daily.    Authorizing Provider: Lorretta Harp    Ordering User: NEWCOMER MCCLAIN, Simrah Chatham L  . lisinopril (ZESTRIL) 5 MG tablet 90 tablet 0    Sig: TAKE ONE (1) TABLET BY MOUTH EVERY DAY    Authorizing Provider: Lorretta Harp    Ordering User: NEWCOMER MCCLAIN, Kaynen Minner L   Viagra order pending.

## 2018-12-06 MED FILL — ATORVASTATIN CALCIUM 40 MG: 40 | 30 days supply | Qty: 30 | Fill #0

## 2018-12-06 MED FILL — GABAPENTIN 300 MG CAPSULE: 300 | 30 days supply | Qty: 30 | Fill #0

## 2018-12-06 MED FILL — CLOPIDOGREL 75 MG TABLET: 75 | 30 days supply | Qty: 30 | Fill #0

## 2018-12-06 MED FILL — DILTIAZEM 24HR ER 120 MG CA: 120 | 30 days supply | Qty: 30 | Fill #5

## 2018-12-06 MED FILL — JARDIANCE 10 MG TABLET: 10 | 30 days supply | Qty: 30 | Fill #0

## 2018-12-06 MED FILL — LISINOPRIL 5 MG TAB: 5 | 30 days supply | Qty: 30 | Fill #1

## 2019-01-17 ENCOUNTER — Other Ambulatory Visit: Payer: Self-pay

## 2019-01-17 ENCOUNTER — Encounter: Payer: Self-pay | Admitting: Cardiovascular Disease

## 2019-01-17 ENCOUNTER — Ambulatory Visit (INDEPENDENT_AMBULATORY_CARE_PROVIDER_SITE_OTHER): Payer: BLUE CROSS/BLUE SHIELD | Admitting: Cardiovascular Disease

## 2019-01-17 DIAGNOSIS — E785 Hyperlipidemia, unspecified: Secondary | ICD-10-CM

## 2019-01-17 DIAGNOSIS — I739 Peripheral vascular disease, unspecified: Secondary | ICD-10-CM

## 2019-01-17 DIAGNOSIS — I10 Essential (primary) hypertension: Secondary | ICD-10-CM

## 2019-01-17 DIAGNOSIS — I6522 Occlusion and stenosis of left carotid artery: Secondary | ICD-10-CM

## 2019-01-17 DIAGNOSIS — Z720 Tobacco use: Secondary | ICD-10-CM

## 2019-01-17 LAB — LIPID PANEL
Chol/HDL Ratio: 2.9 ratio (ref 0.0–5.0)
Cholesterol, Total: 146 mg/dL (ref 100–199)
HDL: 51 mg/dL (ref >39)
LDL Chol Calc (NIH): 71 mg/dL (ref 0–99)
Triglycerides: 137 mg/dL (ref 0–149)
VLDL Cholesterol Cal: 24 mg/dL (ref 5–40)

## 2019-01-17 LAB — HEPATIC FUNCTION PANEL
ALT: 16 IU/L (ref 0–44)
AST: 23 IU/L (ref 0–40)
Albumin: 4.8 g/dL (ref 3.8–4.8)
Alkaline Phosphatase: 80 IU/L (ref 39–117)
Bilirubin Total: 0.6 mg/dL (ref 0.0–1.2)
Bilirubin, Direct: 0.19 mg/dL (ref 0.00–0.40)
Total Protein: 7.8 g/dL (ref 6.0–8.5)

## 2019-01-17 NOTE — Patient Instructions (Signed)
Medication Instructions:  Your physician recommends that you continue on your current medications as directed. Please refer to the Current Medication list given to you today.  If you need a refill on your cardiac medications before your next appointment, please call your pharmacy.   Lab work: Your physician recommends that you return for lab work TODAY: LIPID AND LIVER PANELS  If you have labs (blood work) drawn today and your tests are completely normal, you will receive your results only by: [name] MyChart Message (if you have MyChart) OR . A paper copy in the mail If you have any lab test that is abnormal or we need to change your treatment, we will call you to review the results.  Testing/Procedures: KEEP YOUR APPOINTMENT 01/19/2019 FOR LOWER EXTREMITY ARTERIAL DUPLEX  Follow-Up: At [facility], you and your health needs are our priority.  As part of our continuing mission to provide you with exceptional heart care, we have created designated Provider Care Teams.  These Care Teams include your primary Cardiologist (physician) and Advanced Practice Providers (APPs -  Physician Assistants and Nurse Practitioners) who all work together to provide you with the care you need, when you need it. You will need a follow up appointment in 12 months with Anthony Brock.  Please call our office 2 months in advance to schedule this/each appointment.

## 2019-01-17 NOTE — Assessment & Plan Note (Signed)
Remote history of tobacco abuse having quit 4 years ago.

## 2019-01-17 NOTE — Assessment & Plan Note (Signed)
History of essential hypertension with blood pressure measured today at 142/74.  He is on Cartia XT 120 mg a day and lisinopril

## 2019-01-17 NOTE — Progress Notes (Signed)
01/17/2019 Anthony Brock   March 04, 1956  [id number]  Primary Physician Darron Doom Maebelle Munroe, MD Primary Cardiologist: [name] Lupe Carney, [place]  HPI:  Anthony Brock is a 63 y.o.  married African-American veteran who works at [employer]. He was referred by Dr. Melony Overly at [facility] for peripheral vascular evaluation because of lifestyle limiting claudication.I last saw him in the office  12/29/2017. He had carotid stenting by myself 07/17/14.[name] He has a long history of tobacco abuse smoking three-quarter pack a day for last 30 years having recently stopped smoking after his last peripheral vascular procedure.. He has never had a heart attack or stroke, and denies chest pain or shortness of breath. He states that he has had left greater than right lower extremity claudication which is lifestyle limiting for several years. Recent Dopplers performed in our office 03/20/14 revealed a right ABI 0.58 with an occluded distal right SFA and a left ABI 0.41 with a high-frequency signal in his left SFA. He underwent angiography and intervention on January 18 revealing a 80% Left internal carotid artery stenosis, and short segment occlusions in both SFAs. I was able to perform directional atherectomy on his left SFA followed by drug eluding balloon angioplasty. He had an excellent angiographic and clinical result. He ultimately underwent staged right SFA intervention with excellent angiographic and clinical result. He underwent elective left internal carotid artery stenting for high-grade (80%) asymptomatic left internal carotid artery stenosis 07/17/14. His recent carotid Dopplers performed 02/07/15 reveals to be widely patent. He does have recurrent claudication with Dopplers performed 02/07/15 revealing high-grade distal bilateral SFA restenosis. He underwent right SFA directional arthrectomy and drug-eluting balloon angioplasty on 02/15/15 with excellent angiographic and clinical result. He underwent  staged left SFA intervention by myself using directional atherectomy and drug-eluting balloon angioplasty on 02/25/15. Dopplers performed 2 weeks later that showed ABIs of 0.9 bilaterally. Recentlower extremity Dopplers performed 01/22/16 revealed recurrent bilateral SFA stenosis with slightly lower ABIs. He does complain of some mild claudication. Carotid Dopplers performed 01/24/16 revealed a widely patent left internal carotid artery stent. Because of these findings I decided to proceed with PTA and drug eluding stenting using Zilver PTX of both SFAs. I intervened on the left SFA on December 11. He underwent right SFA intervention 04/20/16. Follow-up lower extremity arterial Doppler studies performed 05/06/16 revealed widely patent stents with normal ABIs. His claudication has resolved. Unfortunately over the last month or 2 his claudication has really heard and his most recent Dopplers performed 10/22/16 revealed a decline in his right ABI 0.94 and his left ABI 0.82 with high-frequency signals in the mid right SFA and proximal left SFA. He underwent peripheral angiography by myself 12/21/16 revealing high-grade bilateral Zilver in-stent restenosis. I performed drug-eluting balloon angioplasty of his right SFA ISR with excellent angiographic result. ABI improved up to 1.1 and his claudication resolved. Heunderwent left SFA intervention by myself 01/14/17 with a polypoid atherectomy of the proximal left SFA a along with drug-eluting dementia plasty of the left SFA "in-stent restenosis. His follow-up Dopplers performed 01/28/17 showed marked improvement. His claudication has essentially resolved on that side. He has noticed some increasing dyspnea on exertion however as well as some occasional substernal chest pain.A Myoview stress test and 2-D echo performed 1025 and 02/09/2017 were entirely normal.   Since I saw him a year ago he is remained stable.  He denies chest pain, shortness of breath or claudication.   He had carotid Doppler studies  performed in February of this year that revealed a widely patent left ICA stent.  His last lower extremity arterial Doppler studies performed a year ago revealed fairly normal ABIs with patent bilateral SFA stents.   Current Meds  Medication Sig   aspirin EC 81 MG tablet Take 1 tablet (81 mg total) by mouth daily.   atorvastatin (LIPITOR) 40 MG tablet Take 1 tablet (40 mg total) by mouth every evening.   Blood Glucose Monitoring Suppl (ONE TOUCH ULTRA MINI) w/Device KIT    Brimonidine Tartrate (LUMIFY OP) Apply 1 drop to eye daily as needed (redness).   CARTIA XT 120 MG 24 hr capsule TAKE 1 CAPSULE BY MOUTH DAILY   cetirizine (ZYRTEC) 10 MG tablet Take 10 mg by mouth daily as needed for allergies.    clopidogrel (PLAVIX) 75 MG tablet Take 1 tablet (75 mg total) by mouth daily.   gabapentin (NEURONTIN) 300 MG capsule Take 300 mg by mouth at bedtime.    JANUMET 50-500 MG tablet Take 1 tablet by mouth 2 (two) times daily.    latanoprost (XALATAN) 0.005 % ophthalmic solution Place 1 drop into both eyes at bedtime.    lisinopril (ZESTRIL) 5 MG tablet TAKE ONE (1) TABLET BY MOUTH EVERY DAY   ONE TOUCH ULTRA TEST test strip    ONETOUCH DELICA LANCETS 40J MISC    VIAGRA 100 MG tablet TAKE 1 TABLET BY MOUTH DAILY AS NEEDED FOR ERECTILE DYSFUNCTION     No Known Allergies  Social History   Socioeconomic History   Marital status: Married    Spouse name: Levada Dy   Number of children: 2   Years of education: 15   Highest education level: Not on file  Occupational History   Occupation: [employer], Pharmacist, community  Social Needs   Financial resource strain: Not on file   Food insecurity    Worry: Not on file    Inability: Not on file   Transportation needs    Medical: Not on file    Non-medical: Not on file  Tobacco Use   Smoking status: Former Smoker    Packs/day: 0.50    Years: 45.00    Pack years: 22.50    Types: Cigarettes     Quit date: 03/01/2015    Years since quitting: 3.8   Smokeless tobacco: Never Used   Tobacco comment: 02/25/2015 "stopped smoking 02/14/2015"  Substance and Sexual Activity   Alcohol use: Yes    Alcohol/week: 0.0 standard drinks    Comment: 12/21/2016 "might have a couple drinks/year"   Drug use: Yes    Comment: "might have used some drugs in my 20's"   Sexual activity: Yes  Lifestyle   Physical activity    Days per week: Not on file    Minutes per session: Not on file   Stress: Not on file  Relationships   Social connections    Talks on phone: Not on file    Gets together: Not on file    Attends religious service: Not on file    Active member of club or organization: Not on file    Attends meetings of clubs or organizations: Not on file    Relationship status: Not on file   Intimate partner violence    Fear of current or ex partner: Not on file    Emotionally abused: Not on file    Physically abused: Not on file    Forced sexual activity: Not on file  Other Topics Concern  Not on file  Social History Narrative   Lives with wife   caffeine- coffee, 1-2 cups daily     Review of Systems: General: negative for chills, fever, night sweats or weight changes.  Cardiovascular: negative for chest pain, dyspnea on exertion, edema, orthopnea, palpitations, paroxysmal nocturnal dyspnea or shortness of breath Dermatological: negative for rash Respiratory: negative for cough or wheezing Urologic: negative for hematuria Abdominal: negative for nausea, vomiting, diarrhea, bright red blood per rectum, melena, or hematemesis Neurologic: negative for visual changes, syncope, or dizziness All other systems reviewed and are otherwise negative except as noted above.    Blood pressure (!) 142/74, pulse (!) 52, height 6' 1"  (1.854 m), weight 186 lb 3.2 oz (84.5 kg), SpO2 96 %.  General appearance: alert and no distress Neck: no adenopathy, no carotid bruit, no JVD, supple,  symmetrical, trachea midline and thyroid not enlarged, symmetric, no tenderness/mass/nodules Lungs: clear to auscultation bilaterally Heart: regular rate and rhythm, S1, S2 normal, no murmur, click, rub or gallop Extremities: extremities normal, atraumatic, no cyanosis or edema Pulses: 2+ and symmetric Skin: Skin color, texture, turgor normal. No rashes or lesions Neurologic: Alert and oriented X 3, normal strength and tone. Normal symmetric reflexes. Normal coordination and gait  EKG sinus bradycardia 52 with nonspecific ST and T wave changes, left axis deviation.  I personally reviewed this EKG.  ASSESSMENT AND PLAN:   Essential hypertension History of essential hypertension with blood pressure measured today at 142/74.  He is on Cartia XT 120 mg a day and lisinopril  Lt ICA stenosis- Carotid stent placed 07/17/14 History of left ICA stenosis demonstrated by angiography by myself 1/16 with subsequent left internal carotid artery stenting by myself 07/17/2014.  Most recent carotid Dopplers performed 09/14/2018 revealed moderate right ICA stenosis with a patent left carotid stent.  We will repeat his carotid Dopplers on annual basis.  PAD (peripheral artery disease) (HCC) History of PAD status post multiple lower extremity interventions beginning with directional atherectomy followed by drug-coated balloon angioplasty.  Both of these interventions ultimately failed.  He he then underwent bilateral Zilver PTA and stenting which ultimately had in-stent restenosis and most recently drug-coated balloon angioplasty and 04/20/2016 on the right side and 01/14/2017 on the left side.  His most recent lower extremity arterial Doppler studies performed 01/18/2018 revealed a right ABI 0.94, left ABI of 1.04 with patent stents.  He denies claudication.  Hyperlipidemia with target LDL less than 70 Hyperlipidemia on statin therapy with lipid profile performed 05/25/2016 revealing total cluster 121, LDL of 50 and HDL  42.  Tobacco abuse Remote history of tobacco abuse having quit 4 years ago.      [name] FACP,FACC,FAHA, [facility] 01/17/2019 9:48 AM

## 2019-01-17 NOTE — Assessment & Plan Note (Signed)
History of PAD status post multiple lower extremity interventions beginning with directional atherectomy followed by drug-coated balloon angioplasty.  Both of these interventions ultimately failed.  He he then underwent bilateral Zilver PTA and stenting which ultimately had in-stent restenosis and most recently drug-coated balloon angioplasty and 04/20/2016 on the right side and 01/14/2017 on the left side.  His most recent lower extremity arterial Doppler studies performed 01/18/2018 revealed a right ABI 0.94, left ABI of 1.04 with patent stents.  He denies claudication.

## 2019-01-17 NOTE — Assessment & Plan Note (Signed)
History of left ICA stenosis demonstrated by angiography by myself 1/16 with subsequent left internal carotid artery stenting by myself 07/17/2014.  Most recent carotid Dopplers performed 09/14/2018 revealed moderate right ICA stenosis with a patent left carotid stent.  We will repeat his carotid Dopplers on annual basis.

## 2019-01-17 NOTE — Assessment & Plan Note (Signed)
Hyperlipidemia on statin therapy with lipid profile performed 05/25/2016 revealing total cluster 121, LDL of 50 and HDL 42.

## 2019-01-19 ENCOUNTER — Encounter: Payer: Self-pay | Admitting: *Deleted

## 2019-01-19 ENCOUNTER — Ambulatory Visit (HOSPITAL_COMMUNITY)
Admission: RE | Admit: 2019-01-19 | Discharge: 2019-01-19 | Disposition: A | Discharge status: Home / Self Care | Payer: Self-pay | Source: Ambulatory Visit | Attending: Cardiology | Admitting: Cardiology

## 2019-01-19 ENCOUNTER — Other Ambulatory Visit: Payer: Self-pay

## 2019-01-19 ENCOUNTER — Other Ambulatory Visit: Payer: Self-pay | Admitting: Cardiovascular Disease

## 2019-01-19 ENCOUNTER — Other Ambulatory Visit (HOSPITAL_COMMUNITY): Payer: Self-pay | Admitting: Cardiovascular Disease

## 2019-01-19 DIAGNOSIS — I739 Peripheral vascular disease, unspecified: Secondary | ICD-10-CM | POA: Insufficient documentation

## 2019-01-19 DIAGNOSIS — Z95820 Peripheral vascular angioplasty status with implants and grafts: Secondary | ICD-10-CM

## 2019-01-19 MED FILL — ATORVASTATIN CALCIUM 40 MG: 40 | 30 days supply | Qty: 30 | Fill #1

## 2019-01-19 MED FILL — LISINOPRIL 5 MG TABLET: 5 | 30 days supply | Qty: 30 | Fill #2

## 2019-01-19 MED FILL — GABAPENTIN 300 MG CAPSULE: 300 | 30 days supply | Qty: 30 | Fill #1

## 2019-01-19 MED FILL — CARTIA XT 120 MG CP24: 120 | 30 days supply | Qty: 30 | Fill #0

## 2019-01-19 MED FILL — CLOPIDOGREL 75 MG TABLET: 75 | 30 days supply | Qty: 30 | Fill #1

## 2019-01-19 NOTE — Telephone Encounter (Signed)
Rx request sent to pharmacy.  

## 2019-01-31 MED FILL — ATORVASTATIN CALCIUM 40 MG: 40 | 30 days supply | Qty: 30 | Fill #1

## 2019-01-31 MED FILL — CARTIA XT 120 MG CP24: 120 | 30 days supply | Qty: 30 | Fill #0

## 2019-01-31 MED FILL — LISINOPRIL 5 MG TABLET: 5 | 30 days supply | Qty: 30 | Fill #2

## 2019-01-31 MED FILL — JARDIANCE 10 MG TABLET: 10 | 30 days supply | Qty: 30 | Fill #1

## 2019-01-31 MED FILL — GABAPENTIN 300 MG CAPSULE: 300 | 30 days supply | Qty: 30 | Fill #1

## 2019-01-31 MED FILL — CLOPIDOGREL 75 MG TABLET: 75 | 30 days supply | Qty: 30 | Fill #1

## 2019-03-06 MED FILL — CLOPIDOGREL 75 MG TABLET: 75 | 30 days supply | Qty: 30 | Fill #2

## 2019-03-06 MED FILL — LISINOPRIL 5 MG TABLET: 5 | 30 days supply | Qty: 30 | Fill #0

## 2019-03-06 MED FILL — CARTIA XT 120 MG CP24: 120 | 30 days supply | Qty: 30 | Fill #1

## 2019-03-06 MED FILL — GABAPENTIN 300 MG CAPSULE: 300 | 30 days supply | Qty: 30 | Fill #2

## 2019-03-06 MED FILL — ATORVASTATIN CALCIUM 40 MG: 40 | 30 days supply | Qty: 30 | Fill #2

## 2019-03-06 MED FILL — JARDIANCE 10 MG TABLET: 10 | 14 days supply | Qty: 14 | Fill #2

## 2019-04-17 MED FILL — LISINOPRIL 5 MG TABLET: 5 | 30 days supply | Qty: 30 | Fill #1

## 2019-04-17 MED FILL — JARDIANCE 10 MG TABLET: 10 | 14 days supply | Qty: 14 | Fill #3

## 2019-04-17 MED FILL — GABAPENTIN 300 MG CAPSULE: 300 | 30 days supply | Qty: 30 | Fill #1

## 2019-04-17 MED FILL — CLOPIDOGREL 75 MG TABLET: 75 | 90 days supply | Qty: 90 | Fill #0

## 2019-04-17 MED FILL — DILTIAZEM 24HR ER 120 MG CA: 120 | 30 days supply | Qty: 30 | Fill #2

## 2019-04-17 MED FILL — ATORVASTATIN CALCIUM 40 MG: 40 | 30 days supply | Qty: 30 | Fill #0

## 2019-05-24 MED FILL — ATORVASTATIN CALCIUM 40 MG: 40 | 30 days supply | Qty: 30 | Fill #1

## 2019-05-24 MED FILL — DILTIAZEM 24HR ER 120 MG CA: 120 | 30 days supply | Qty: 30 | Fill #3

## 2019-05-24 MED FILL — GABAPENTIN 300 MG CAPSULE: 300 | 30 days supply | Qty: 30 | Fill #2

## 2019-05-24 MED FILL — CLOPIDOGREL 75 MG TABLET: 75 | 30 days supply | Qty: 30 | Fill #1

## 2019-05-24 MED FILL — LISINOPRIL 5 MG TABLET: 5 | 30 days supply | Qty: 30 | Fill #2

## 2019-06-27 MED FILL — LISINOPRIL 5 MG TABLET: 5 | 30 days supply | Qty: 30 | Fill #0

## 2019-06-27 MED FILL — CLOPIDOGREL 75 MG TABLET: 75 | 30 days supply | Qty: 30 | Fill #2

## 2019-06-27 MED FILL — DILTIAZEM 24HR ER 120 MG CA: 120 | 30 days supply | Qty: 30 | Fill #4

## 2019-06-27 MED FILL — ATORVASTATIN CALCIUM 40 MG: 40 | 30 days supply | Qty: 30 | Fill #2

## 2019-06-28 MED FILL — GABAPENTIN 300 MG CAPSULE: 300 | 30 days supply | Qty: 30 | Fill #0

## 2019-08-15 ENCOUNTER — Other Ambulatory Visit: Payer: Self-pay | Admitting: Cardiovascular Disease

## 2019-08-15 MED FILL — ATORVASTATIN CALCIUM 40 MG: 40 | 30 days supply | Qty: 30 | Fill #1

## 2019-08-15 MED FILL — DILTIAZEM 24HR ER 120 MG CA: 120 | 30 days supply | Qty: 30 | Fill #5

## 2019-08-15 MED FILL — LISINOPRIL 5 MG TABLET: 5 | 30 days supply | Qty: 30 | Fill #1

## 2019-08-17 MED FILL — CLOPIDOGREL 75 MG TABLET: 75 | 30 days supply | Qty: 30 | Fill #0

## 2019-08-17 MED FILL — GABAPENTIN 300 MG CAPSULE: 300 | 30 days supply | Qty: 30 | Fill #0

## 2019-09-13 ENCOUNTER — Other Ambulatory Visit: Payer: Self-pay | Admitting: Cardiovascular Disease

## 2019-09-13 MED FILL — CLOPIDOGREL 75 MG TABLET: 75 | 30 days supply | Qty: 30 | Fill #1

## 2019-09-13 MED FILL — ATORVASTATIN CALCIUM 40 MG: 40 | 30 days supply | Qty: 30 | Fill #2

## 2019-09-14 ENCOUNTER — Other Ambulatory Visit: Payer: Self-pay | Admitting: Family Medicine

## 2019-09-14 ENCOUNTER — Inpatient Hospital Stay (HOSPITAL_COMMUNITY): Admission: RE | Admit: 2019-09-14 | Payer: Self-pay | Source: Ambulatory Visit

## 2019-09-14 MED FILL — DILTIAZEM 24HR ER 120 MG CA: 120 | 30 days supply | Qty: 30 | Fill #0

## 2019-09-15 MED FILL — GABAPENTIN 300 MG CAPSULE: 300 | 30 days supply | Qty: 30 | Fill #0

## 2019-09-15 MED FILL — SILDENAFIL CITRATE 20 MG TA: 20 | 30 days supply | Qty: 20 | Fill #0

## 2019-09-15 MED FILL — LISINOPRIL 10 MG TABS: 10 | 30 days supply | Qty: 30 | Fill #0

## 2019-09-25 ENCOUNTER — Other Ambulatory Visit: Payer: Self-pay | Admitting: Family Medicine

## 2019-09-26 MED FILL — JARDIANCE 10 MG TABLET: 10 | 30 days supply | Qty: 30 | Fill #0

## 2019-10-05 MED FILL — MITIGARE 0.6 MG CAPSULE: 0.6 | 10 days supply | Qty: 30 | Fill #0

## 2019-10-18 ENCOUNTER — Ambulatory Visit (HOSPITAL_COMMUNITY)
Admission: RE | Admit: 2019-10-18 | Payer: Self-pay | Source: Ambulatory Visit | Attending: Cardiovascular Disease | Admitting: Cardiovascular Disease

## 2019-10-18 MED FILL — ATORVASTATIN CALCIUM 40 MG: 40 | 30 days supply | Qty: 30 | Fill #0

## 2019-10-18 MED FILL — GABAPENTIN 300 MG CAPSULE: 300 | 30 days supply | Qty: 30 | Fill #1

## 2019-10-18 MED FILL — DILTIAZEM 24HR ER 120 MG CA: 120 | 30 days supply | Qty: 30 | Fill #1

## 2019-10-18 MED FILL — MITIGARE 0.6 MG CAPSULE: 0.6 | 10 days supply | Qty: 30 | Fill #0

## 2019-10-18 MED FILL — CLOPIDOGREL 75 MG TABLET: 75 | 30 days supply | Qty: 30 | Fill #2

## 2019-10-18 MED FILL — LISINOPRIL 10 MG TABS: 10 | 30 days supply | Qty: 30 | Fill #1

## 2019-11-07 ENCOUNTER — Other Ambulatory Visit (HOSPITAL_COMMUNITY): Payer: Self-pay | Admitting: Cardiovascular Disease

## 2019-11-07 ENCOUNTER — Other Ambulatory Visit: Payer: Self-pay

## 2019-11-07 ENCOUNTER — Ambulatory Visit (HOSPITAL_COMMUNITY)
Admission: RE | Admit: 2019-11-07 | Discharge: 2019-11-07 | Disposition: A | Discharge status: Home / Self Care | Payer: PPO | Source: Ambulatory Visit | Attending: Cardiology | Admitting: Cardiology

## 2019-11-07 DIAGNOSIS — I6523 Occlusion and stenosis of bilateral carotid arteries: Secondary | ICD-10-CM | POA: Diagnosis not present

## 2019-11-15 MED FILL — LISINOPRIL 10 MG TABS: 10 | 30 days supply | Qty: 30 | Fill #2

## 2019-11-15 MED FILL — GABAPENTIN 300 MG CAPSULE: 300 | 30 days supply | Qty: 30 | Fill #2

## 2019-11-15 MED FILL — ATORVASTATIN CALCIUM 40 MG: 40 | 30 days supply | Qty: 30 | Fill #1

## 2019-11-15 MED FILL — JARDIANCE 10 MG TABLET: 10 | 30 days supply | Qty: 30 | Fill #1

## 2019-11-15 MED FILL — DILTIAZEM 24HR ER 120 MG CA: 120 | 30 days supply | Qty: 30 | Fill #2

## 2019-11-16 MED FILL — MITIGARE 0.6 MG CAPSULE: 0.6 | 10 days supply | Qty: 30 | Fill #0

## 2019-11-24 MED FILL — DILTIAZEM 24HR ER 120 MG CA: 120 | 30 days supply | Qty: 30 | Fill #2

## 2019-11-24 MED FILL — SILDENAFIL CITRATE 20 MG TA: 20 | 30 days supply | Qty: 20 | Fill #1

## 2019-11-24 MED FILL — JARDIANCE 10 MG TABLET: 10 | 30 days supply | Qty: 30 | Fill #1

## 2019-11-24 MED FILL — ATORVASTATIN CALCIUM 40 MG: 40 | 30 days supply | Qty: 30 | Fill #1

## 2019-11-24 MED FILL — GABAPENTIN 300 MG CAPSULE: 300 | 30 days supply | Qty: 30 | Fill #2

## 2019-11-24 MED FILL — LISINOPRIL 10 MG TABS: 10 | 30 days supply | Qty: 30 | Fill #2

## 2019-11-24 MED FILL — CLOPIDOGREL 75 MG TABLET: 75 | 30 days supply | Qty: 30 | Fill #0

## 2019-12-11 MED FILL — MITIGARE 0.6 MG CAPSULE: 0.6 | 10 days supply | Qty: 30 | Fill #0

## 2019-12-28 MED FILL — ATORVASTATIN CALCIUM 40 MG: 40 | 30 days supply | Qty: 30 | Fill #2

## 2019-12-28 MED FILL — DILTIAZEM 24HR ER 120 MG CA: 120 | 30 days supply | Qty: 30 | Fill #3

## 2019-12-28 MED FILL — CLOPIDOGREL 75 MG TABLET: 75 | 30 days supply | Qty: 30 | Fill #1

## 2019-12-29 MED FILL — MITIGARE 0.6 MG CAPSULE: 0.6 | 10 days supply | Qty: 30 | Fill #0

## 2019-12-29 MED FILL — GABAPENTIN 300 MG CAPSULE: 300 | 30 days supply | Qty: 30 | Fill #0

## 2020-01-01 MED FILL — MITIGARE 0.6 MG CAPSULE: 0.6 | 10 days supply | Qty: 30 | Fill #0

## 2020-01-01 MED FILL — DILTIAZEM 24HR ER 120 MG CA: 120 | 30 days supply | Qty: 30 | Fill #3

## 2020-01-01 MED FILL — ATORVASTATIN CALCIUM 40 MG: 40 | 30 days supply | Qty: 30 | Fill #2

## 2020-01-01 MED FILL — CLOPIDOGREL 75 MG TABLET: 75 | 30 days supply | Qty: 30 | Fill #1

## 2020-01-01 MED FILL — GABAPENTIN 300 MG CAPSULE: 300 | 30 days supply | Qty: 30 | Fill #0

## 2020-01-22 ENCOUNTER — Other Ambulatory Visit: Payer: Self-pay

## 2020-01-22 ENCOUNTER — Other Ambulatory Visit: Payer: Self-pay | Admitting: Family Medicine

## 2020-01-22 ENCOUNTER — Ambulatory Visit (HOSPITAL_COMMUNITY)
Admission: RE | Admit: 2020-01-22 | Discharge: 2020-01-22 | Disposition: A | Discharge status: Home / Self Care | Payer: PPO | Source: Ambulatory Visit | Attending: Cardiology | Admitting: Cardiology

## 2020-01-22 ENCOUNTER — Other Ambulatory Visit (HOSPITAL_COMMUNITY): Payer: Self-pay | Admitting: Cardiovascular Disease

## 2020-01-22 DIAGNOSIS — N529 Male erectile dysfunction, unspecified: Secondary | ICD-10-CM | POA: Diagnosis not present

## 2020-01-22 DIAGNOSIS — M109 Gout, unspecified: Secondary | ICD-10-CM | POA: Diagnosis not present

## 2020-01-22 DIAGNOSIS — E559 Vitamin D deficiency, unspecified: Secondary | ICD-10-CM | POA: Diagnosis not present

## 2020-01-22 DIAGNOSIS — Z95820 Peripheral vascular angioplasty status with implants and grafts: Secondary | ICD-10-CM | POA: Diagnosis not present

## 2020-01-22 DIAGNOSIS — I739 Peripheral vascular disease, unspecified: Secondary | ICD-10-CM | POA: Diagnosis not present

## 2020-01-22 DIAGNOSIS — I2510 Atherosclerotic heart disease of native coronary artery without angina pectoris: Secondary | ICD-10-CM | POA: Diagnosis not present

## 2020-01-22 DIAGNOSIS — E119 Type 2 diabetes mellitus without complications: Secondary | ICD-10-CM | POA: Diagnosis not present

## 2020-01-22 DIAGNOSIS — E785 Hyperlipidemia, unspecified: Secondary | ICD-10-CM | POA: Diagnosis not present

## 2020-01-22 DIAGNOSIS — I10 Essential (primary) hypertension: Secondary | ICD-10-CM | POA: Diagnosis not present

## 2020-01-22 DIAGNOSIS — Z23 Encounter for immunization: Secondary | ICD-10-CM | POA: Diagnosis not present

## 2020-01-22 DIAGNOSIS — Z125 Encounter for screening for malignant neoplasm of prostate: Secondary | ICD-10-CM | POA: Diagnosis not present

## 2020-01-22 DIAGNOSIS — F17290 Nicotine dependence, other tobacco product, uncomplicated: Secondary | ICD-10-CM | POA: Diagnosis not present

## 2020-01-22 MED FILL — ALLOPURINOL 100 MG TABLET: 100 | 30 days supply | Qty: 30 | Fill #0

## 2020-01-22 MED FILL — LISINOPRIL 10 MG TABS: 10 | 30 days supply | Qty: 30 | Fill #0

## 2020-01-24 MED FILL — VIT D2 1.25 MG (50,000 UNIT: 1.25 MG | 84 days supply | Qty: 12 | Fill #0

## 2020-01-26 ENCOUNTER — Other Ambulatory Visit: Payer: Self-pay

## 2020-01-26 DIAGNOSIS — I739 Peripheral vascular disease, unspecified: Secondary | ICD-10-CM

## 2020-01-26 NOTE — Progress Notes (Signed)
vas 

## 2020-02-16 MED FILL — LISINOPRIL 10 MG TABS: 10 | 30 days supply | Qty: 30 | Fill #1

## 2020-02-16 MED FILL — ATORVASTATIN CALCIUM 40 MG: 40 | 30 days supply | Qty: 30 | Fill #0

## 2020-02-16 MED FILL — JARDIANCE 10 MG TABLET: 10 | 30 days supply | Qty: 30 | Fill #2

## 2020-02-16 MED FILL — ALLOPURINOL 100 MG TABLET: 100 | 30 days supply | Qty: 30 | Fill #1

## 2020-02-16 MED FILL — CLOPIDOGREL 75 MG TABLET: 75 | 30 days supply | Qty: 30 | Fill #2

## 2020-02-16 MED FILL — CARTIA XT 120 MG CP24: 120 | 30 days supply | Qty: 30 | Fill #4

## 2020-02-16 MED FILL — GABAPENTIN 300 MG CAPSULE: 300 | 30 days supply | Qty: 30 | Fill #0

## 2020-02-23 MED FILL — LISINOPRIL 10 MG TABS: 10 | 30 days supply | Qty: 30 | Fill #1

## 2020-02-23 MED FILL — JARDIANCE 10 MG TABLET: 10 | 30 days supply | Qty: 30 | Fill #2

## 2020-02-23 MED FILL — ATORVASTATIN CALCIUM 40 MG: 40 | 30 days supply | Qty: 30 | Fill #0

## 2020-02-23 MED FILL — GABAPENTIN 300 MG CAPSULE: 300 | 30 days supply | Qty: 30 | Fill #0

## 2020-02-23 MED FILL — ALLOPURINOL 100 MG TABLET: 100 | 30 days supply | Qty: 30 | Fill #1

## 2020-03-26 MED FILL — ALLOPURINOL 100 MG TABLET: 100 | 30 days supply | Qty: 30 | Fill #2

## 2020-03-26 MED FILL — CLOPIDOGREL 75 MG TABLET: 75 | 30 days supply | Qty: 30 | Fill #0

## 2020-03-26 MED FILL — GABAPENTIN 300 MG CAPSULE: 300 | 30 days supply | Qty: 30 | Fill #1

## 2020-03-26 MED FILL — LISINOPRIL 10 MG TABS: 10 | 30 days supply | Qty: 30 | Fill #2

## 2020-03-26 MED FILL — CARTIA XT 120 MG CP24: 120 | 30 days supply | Qty: 30 | Fill #4

## 2020-03-26 MED FILL — ATORVASTATIN CALCIUM 40 MG: 40 | 30 days supply | Qty: 30 | Fill #1

## 2020-03-26 MED FILL — JARDIANCE 10 MG TABLET: 10 | 30 days supply | Qty: 30 | Fill #0

## 2020-04-26 ENCOUNTER — Other Ambulatory Visit: Payer: Self-pay | Admitting: Family Medicine

## 2020-04-26 MED FILL — GABAPENTIN 300 MG CAPSULE: 300 | 30 days supply | Qty: 30 | Fill #2

## 2020-04-26 MED FILL — VIT D2 1.25 MG (50,000 UNIT: 1.25 MG | 84 days supply | Qty: 12 | Fill #0

## 2020-04-26 MED FILL — CARTIA XT 120 MG CP24: 120 | 30 days supply | Qty: 30 | Fill #5

## 2020-04-26 MED FILL — CLOPIDOGREL 75 MG TABLET: 75 | 30 days supply | Qty: 30 | Fill #1

## 2020-04-26 MED FILL — ATORVASTATIN CALCIUM 40 MG: 40 | 30 days supply | Qty: 30 | Fill #2

## 2020-04-26 MED FILL — JARDIANCE 10 MG TABLET: 10 | 30 days supply | Qty: 30 | Fill #1

## 2020-05-10 ENCOUNTER — Other Ambulatory Visit: Payer: Self-pay | Admitting: Family Medicine

## 2020-05-10 DIAGNOSIS — H40013 Open angle with borderline findings, low risk, bilateral: Secondary | ICD-10-CM | POA: Diagnosis not present

## 2020-05-10 MED FILL — ALLOPURINOL 100 MG TABLET: 100 | 30 days supply | Qty: 30 | Fill #0

## 2020-05-10 MED FILL — LISINOPRIL 10 MG TABS: 10 | 30 days supply | Qty: 30 | Fill #0

## 2020-05-27 ENCOUNTER — Other Ambulatory Visit: Payer: Self-pay | Admitting: Family Medicine

## 2020-05-27 MED FILL — COLCHICINE 0.6 MG CAPSULE: 0.6 | 10 days supply | Qty: 30 | Fill #0

## 2020-05-30 ENCOUNTER — Other Ambulatory Visit: Payer: Self-pay | Admitting: Family Medicine

## 2020-06-18 MED FILL — CLOPIDOGREL 75 MG TABLET: 75 | 30 days supply | Qty: 30 | Fill #2

## 2020-06-18 MED FILL — COLCHICINE 0.6 MG CAPSULE: 0.6 | 3 days supply | Qty: 6 | Fill #0

## 2020-06-18 MED FILL — JARDIANCE 10 MG TABLET: 10 | 30 days supply | Qty: 30 | Fill #2

## 2020-06-18 MED FILL — CARTIA XT 120 MG CP24: 120 | 30 days supply | Qty: 30 | Fill #6

## 2020-06-24 DIAGNOSIS — I739 Peripheral vascular disease, unspecified: Secondary | ICD-10-CM | POA: Diagnosis not present

## 2020-06-24 DIAGNOSIS — N529 Male erectile dysfunction, unspecified: Secondary | ICD-10-CM | POA: Diagnosis not present

## 2020-06-24 DIAGNOSIS — Z0000 Encounter for general adult medical examination without abnormal findings: Secondary | ICD-10-CM | POA: Diagnosis not present

## 2020-06-24 DIAGNOSIS — E119 Type 2 diabetes mellitus without complications: Secondary | ICD-10-CM | POA: Diagnosis not present

## 2020-06-24 DIAGNOSIS — I2510 Atherosclerotic heart disease of native coronary artery without angina pectoris: Secondary | ICD-10-CM | POA: Diagnosis not present

## 2020-06-24 DIAGNOSIS — Z23 Encounter for immunization: Secondary | ICD-10-CM | POA: Diagnosis not present

## 2020-06-24 DIAGNOSIS — I10 Essential (primary) hypertension: Secondary | ICD-10-CM | POA: Diagnosis not present

## 2020-06-24 DIAGNOSIS — F17290 Nicotine dependence, other tobacco product, uncomplicated: Secondary | ICD-10-CM | POA: Diagnosis not present

## 2020-06-24 DIAGNOSIS — E785 Hyperlipidemia, unspecified: Secondary | ICD-10-CM | POA: Diagnosis not present

## 2020-07-29 ENCOUNTER — Other Ambulatory Visit: Payer: Self-pay

## 2020-08-28 DIAGNOSIS — H401132 Primary open-angle glaucoma, bilateral, moderate stage: Secondary | ICD-10-CM | POA: Diagnosis not present

## 2020-10-09 ENCOUNTER — Other Ambulatory Visit: Payer: Self-pay | Admitting: Cardiovascular Disease

## 2020-11-06 ENCOUNTER — Ambulatory Visit (HOSPITAL_COMMUNITY)
Admission: RE | Admit: 2020-11-06 | Discharge: 2020-11-06 | Disposition: A | Discharge status: Home / Self Care | Payer: PPO | Source: Ambulatory Visit | Attending: Cardiology | Admitting: Cardiology

## 2020-11-06 ENCOUNTER — Other Ambulatory Visit (HOSPITAL_COMMUNITY): Payer: Self-pay | Admitting: Cardiovascular Disease

## 2020-11-06 ENCOUNTER — Encounter: Payer: Self-pay | Admitting: *Deleted

## 2020-11-06 ENCOUNTER — Other Ambulatory Visit: Payer: Self-pay

## 2020-11-06 DIAGNOSIS — I6523 Occlusion and stenosis of bilateral carotid arteries: Secondary | ICD-10-CM | POA: Diagnosis not present

## 2020-11-26 DIAGNOSIS — I10 Essential (primary) hypertension: Secondary | ICD-10-CM | POA: Diagnosis not present

## 2020-11-26 DIAGNOSIS — I2510 Atherosclerotic heart disease of native coronary artery without angina pectoris: Secondary | ICD-10-CM | POA: Diagnosis not present

## 2020-11-26 DIAGNOSIS — E785 Hyperlipidemia, unspecified: Secondary | ICD-10-CM | POA: Diagnosis not present

## 2020-11-26 DIAGNOSIS — Z0001 Encounter for general adult medical examination with abnormal findings: Secondary | ICD-10-CM | POA: Diagnosis not present

## 2020-11-26 DIAGNOSIS — F17290 Nicotine dependence, other tobacco product, uncomplicated: Secondary | ICD-10-CM | POA: Diagnosis not present

## 2020-11-26 DIAGNOSIS — I739 Peripheral vascular disease, unspecified: Secondary | ICD-10-CM | POA: Diagnosis not present

## 2020-11-26 DIAGNOSIS — E119 Type 2 diabetes mellitus without complications: Secondary | ICD-10-CM | POA: Diagnosis not present

## 2020-11-26 DIAGNOSIS — E559 Vitamin D deficiency, unspecified: Secondary | ICD-10-CM | POA: Diagnosis not present

## 2020-11-26 DIAGNOSIS — M109 Gout, unspecified: Secondary | ICD-10-CM | POA: Diagnosis not present

## 2021-01-14 ENCOUNTER — Telehealth: Payer: Self-pay | Admitting: *Deleted

## 2021-01-14 NOTE — Telephone Encounter (Signed)
   Primary Cardiologist: Nanetta Batty, MD  Chart reviewed as part of pre-operative protocol coverage. Simple dental extractions are considered low risk procedures per guidelines and generally do not require any specific cardiac clearance. It is also generally accepted that for simple extractions and dental cleanings, there is no need to interrupt blood thinner therapy.   SBE prophylaxis is not required for the patient.  I will route this recommendation to the requesting party via Epic fax function and remove from pre-op pool.  Please call with questions.  Ronney Asters, NP 01/14/2021, 10:24 AM

## 2021-01-14 NOTE — Telephone Encounter (Signed)
   [facility] Pre-operative Risk Assessment    Patient Name: Anthony Brock  DOB: 12/31/1955 MRN: [id number]    Request for surgical clearance:  What type of surgery is being performed?  Two extractions  When is this surgery scheduled? 01/16/21  What type of clearance is required (medical clearance vs. Pharmacy clearance to hold med vs. Both)? medical  Are there any medications that need to be held prior to surgery and how long? plavix  Practice name and name of physician performing surgery? Dr Moss Mc II  DDS  What is the office phone number? [phone]   7.   What is the office fax number? [phone]  1.   Anesthesia type (None, local, MAC, general) ? unknown   Anthony Brock 01/14/2021, 9:44 AM  _________________________________________________________________   (provider comments below)

## 2021-01-21 ENCOUNTER — Ambulatory Visit (HOSPITAL_COMMUNITY)
Admission: RE | Admit: 2021-01-21 | Discharge: 2021-01-21 | Disposition: A | Discharge status: Home / Self Care | Payer: PPO | Source: Ambulatory Visit | Attending: Cardiology | Admitting: Cardiology

## 2021-01-21 ENCOUNTER — Other Ambulatory Visit: Payer: Self-pay

## 2021-01-21 DIAGNOSIS — I739 Peripheral vascular disease, unspecified: Secondary | ICD-10-CM | POA: Insufficient documentation

## 2021-01-21 DIAGNOSIS — Z95820 Peripheral vascular angioplasty status with implants and grafts: Secondary | ICD-10-CM | POA: Insufficient documentation

## 2021-03-04 DIAGNOSIS — I739 Peripheral vascular disease, unspecified: Secondary | ICD-10-CM | POA: Diagnosis not present

## 2021-03-04 DIAGNOSIS — F17290 Nicotine dependence, other tobacco product, uncomplicated: Secondary | ICD-10-CM | POA: Diagnosis not present

## 2021-03-04 DIAGNOSIS — E559 Vitamin D deficiency, unspecified: Secondary | ICD-10-CM | POA: Diagnosis not present

## 2021-03-04 DIAGNOSIS — I2510 Atherosclerotic heart disease of native coronary artery without angina pectoris: Secondary | ICD-10-CM | POA: Diagnosis not present

## 2021-03-04 DIAGNOSIS — E785 Hyperlipidemia, unspecified: Secondary | ICD-10-CM | POA: Diagnosis not present

## 2021-03-04 DIAGNOSIS — I10 Essential (primary) hypertension: Secondary | ICD-10-CM | POA: Diagnosis not present

## 2021-03-04 DIAGNOSIS — E119 Type 2 diabetes mellitus without complications: Secondary | ICD-10-CM | POA: Diagnosis not present

## 2021-03-08 LAB — EXTERNAL GENERIC LAB PROCEDURE: COLOGUARD: NEGATIVE

## 2021-03-10 DIAGNOSIS — L0201 Cutaneous abscess of face: Secondary | ICD-10-CM | POA: Diagnosis not present

## 2021-03-10 DIAGNOSIS — E119 Type 2 diabetes mellitus without complications: Secondary | ICD-10-CM | POA: Diagnosis not present

## 2021-03-11 DIAGNOSIS — L0201 Cutaneous abscess of face: Secondary | ICD-10-CM | POA: Diagnosis not present

## 2021-03-11 DIAGNOSIS — E119 Type 2 diabetes mellitus without complications: Secondary | ICD-10-CM | POA: Diagnosis not present

## 2021-03-11 DIAGNOSIS — R6884 Jaw pain: Secondary | ICD-10-CM | POA: Diagnosis not present

## 2021-03-12 ENCOUNTER — Other Ambulatory Visit (HOSPITAL_COMMUNITY): Payer: Self-pay | Admitting: Cardiovascular Disease

## 2021-03-12 DIAGNOSIS — I739 Peripheral vascular disease, unspecified: Secondary | ICD-10-CM

## 2021-11-06 ENCOUNTER — Ambulatory Visit (HOSPITAL_COMMUNITY)
Admission: RE | Admit: 2021-11-06 | Discharge: 2021-11-06 | Disposition: A | Discharge status: Home / Self Care | Payer: PPO | Source: Ambulatory Visit | Attending: Cardiology | Admitting: Cardiology

## 2021-11-06 DIAGNOSIS — I6523 Occlusion and stenosis of bilateral carotid arteries: Secondary | ICD-10-CM | POA: Diagnosis present

## 2022-01-14 ENCOUNTER — Other Ambulatory Visit (HOSPITAL_COMMUNITY): Payer: Self-pay | Admitting: Cardiovascular Disease

## 2022-01-14 DIAGNOSIS — Z95828 Presence of other vascular implants and grafts: Secondary | ICD-10-CM

## 2022-01-14 DIAGNOSIS — I6523 Occlusion and stenosis of bilateral carotid arteries: Secondary | ICD-10-CM

## 2022-02-09 ENCOUNTER — Ambulatory Visit (HOSPITAL_COMMUNITY)
Admission: RE | Admit: 2022-02-09 | Discharge: 2022-02-09 | Disposition: A | Discharge status: Home / Self Care | Payer: PPO | Source: Ambulatory Visit | Attending: Internal Medicine | Admitting: Internal Medicine

## 2022-02-09 DIAGNOSIS — I739 Peripheral vascular disease, unspecified: Secondary | ICD-10-CM | POA: Diagnosis not present

## 2022-02-18 ENCOUNTER — Ambulatory Visit: Payer: PPO | Attending: Cardiovascular Disease | Admitting: Cardiovascular Disease

## 2022-02-18 ENCOUNTER — Encounter: Payer: Self-pay | Admitting: Cardiovascular Disease

## 2022-02-18 VITALS — BP 134/76 | HR 72 | Ht 73.0 in | Wt 178.8 lb

## 2022-02-18 DIAGNOSIS — I10 Essential (primary) hypertension: Secondary | ICD-10-CM | POA: Diagnosis not present

## 2022-02-18 DIAGNOSIS — I6522 Occlusion and stenosis of left carotid artery: Secondary | ICD-10-CM

## 2022-02-18 DIAGNOSIS — I6523 Occlusion and stenosis of bilateral carotid arteries: Secondary | ICD-10-CM

## 2022-02-18 DIAGNOSIS — E785 Hyperlipidemia, unspecified: Secondary | ICD-10-CM | POA: Diagnosis not present

## 2022-02-18 DIAGNOSIS — I739 Peripheral vascular disease, unspecified: Secondary | ICD-10-CM | POA: Diagnosis not present

## 2022-02-18 NOTE — Assessment & Plan Note (Signed)
History of carotid artery disease status post left internal carotid artery stenting by myself/5/16.  Carotid Dopplers recently performed 11/06/2021 revealed this to be widely patent.  We will continue to follow this on an annual basis.

## 2022-02-18 NOTE — Assessment & Plan Note (Signed)
History of essential hypertension a blood pressure measured today at 134/76.  He is on lisinopril and Cardizem.

## 2022-02-18 NOTE — Patient Instructions (Signed)
Medication Instructions:  NO CHANGES  *If you need a refill on your cardiac medications before your next appointment, please call your pharmacy*   Lab Work: FASTING lab work to check cholesterol and liver function as soon as you are able   If you have labs (blood work) drawn today and your tests are completely normal, you will receive your results only by: MyChart Message (if you have MyChart) OR A paper copy in the mail If you have any lab test that is abnormal or we need to change your treatment, we will call you to review the results.   Testing/Procedures:  Carotid Doppler - July 2024 Lower Extremity Arterial Doppler - late October 2024   Follow-Up: At [facility], you and your health needs are our priority.  As part of our continuing mission to provide you with exceptional heart care, we have created designated Provider Care Teams.  These Care Teams include your primary Cardiologist (physician) and Advanced Practice Providers (APPs -  Physician Assistants and Nurse Practitioners) who all work together to provide you with the care you need, when you need it.  We recommend signing up for the patient portal called "MyChart".  Sign up information is provided on this After Visit Summary.  MyChart is used to connect with patients for Virtual Visits (Telemedicine).  Patients are able to view lab/test results, encounter notes, upcoming appointments, etc.  Non-urgent messages can be sent to your provider as well.   To learn more about what you can do with MyChart, go to [email or web address].    Your next appointment:   12 month(s)  The format for your next appointment:   In Person  Provider:   Nanetta Batty, MD

## 2022-02-18 NOTE — Assessment & Plan Note (Signed)
History of peripheral arterial disease status post multiple interventions on both SFAs due to restenosis.  His most recent intervention was 10//18 with angioplasty of the mid left SFA stent and intervention on a more proximal lesion.  He has had intervention of his right SFA 04/20/2016.  He has mild left calf claudication.  Recent Doppler studies performed 02/09/2022 revealed a right ABI of 0.82 and a left of 0.78.  He did have a patent right SFA stent with a high-frequency signal in his mid left SFA stent suggesting in-stent restenosis.  At this point, his symptoms are not severe enough to warrant intervention but we will continue to follow him noninvasively.

## 2022-02-18 NOTE — Progress Notes (Signed)
02/18/2022 [name]   03-24-1956  [id number]  Primary Physician Anthony Doom Maebelle Munroe, MD Primary Cardiologist: Anthony Harp MD Anthony Brock, [place]  HPI:  Anthony Brock is a 67 y.o.  married African-American veteran who works at [employer]. He was referred by Dr. Melony Brock at [facility] for peripheral vascular evaluation because of lifestyle limiting claudication.I last saw him in the office 01/17/2019. He had carotid stenting by myself 07/17/14.[name] He has a long history of tobacco abuse smoking three-quarter pack a day for last 30 years having recently stopped smoking after his last peripheral vascular procedure.. He has never had a heart attack or stroke, and denies chest pain or shortness of breath. He states that he has had left greater than right lower extremity claudication which is lifestyle limiting for several years. Recent Dopplers performed in our office 03/20/14 revealed a right ABI 0.58 with an occluded distal right SFA and a left ABI 0.41 with a high-frequency signal in his left SFA. He underwent angiography and intervention on January 18 revealing a 80% Left  internal carotid artery stenosis, and short segment occlusions in both SFAs. I was able to perform directional atherectomy on his left SFA followed by drug eluding balloon angioplasty. He had an excellent angiographic and clinical result. He ultimately underwent staged right SFA intervention with excellent angiographic and clinical result. He underwent elective left internal carotid artery stenting for high-grade (80%) asymptomatic left internal carotid artery stenosis 07/17/14. His recent carotid Dopplers performed 02/07/15 reveals to be widely patent. He does have recurrent claudication with Dopplers performed 02/07/15 revealing high-grade distal bilateral SFA restenosis. He underwent right SFA directional arthrectomy and drug-eluting balloon angioplasty on 02/15/15 with excellent angiographic and clinical result. He underwent  staged left SFA intervention by myself using directional atherectomy and drug-eluting balloon angioplasty on 02/25/15. Dopplers performed 2 weeks later that showed ABIs of 0.9 bilaterally. Recent lower extremity Dopplers performed 01/22/16 revealed recurrent bilateral SFA stenosis with slightly lower ABIs. He does complain of some mild claudication. Carotid Dopplers performed 01/24/16 revealed a widely patent left internal carotid artery stent. Because of these findings I decided to proceed with PTA and drug eluding stenting using Zilver PTX  of both SFAs. I intervened on the left SFA on December 11. He underwent right SFA intervention 04/20/16. Follow-up lower extremity arterial Doppler studies performed 05/06/16 revealed widely patent stents with normal ABIs. His claudication has resolved. Unfortunately over the last month or 2 his claudication has really heard and his most recent Dopplers performed 10/22/16 revealed a decline in his right ABI 0.94 and his left ABI 0.82 with high-frequency signals in the mid right SFA and proximal left SFA. He underwent peripheral angiography by myself 12/21/16 revealing high-grade bilateral Zilver in-stent restenosis. I performed drug-eluting balloon angioplasty of his right SFA ISR with excellent angiographic result. ABI improved up to 1.1 and his claudication resolved. He underwent left SFA intervention by myself 01/14/17 with a polypoid atherectomy of the proximal left SFA a along with drug-eluting dementia plasty of the left SFA "in-stent restenosis. His follow-up Dopplers performed 01/28/17 showed marked improvement. His claudication has essentially resolved on that side. He has noticed some increasing dyspnea on exertion however as well as some occasional substernal chest pain. A Myoview stress test and 2-D echo performed 1025 and 02/09/2017 were entirely normal.     Since I saw him in the office 3 years ago he continues to do well.  He is smoking a third of  a pack a day but  says he is "trying to quit".  He denies chest pain or shortness of breath.  He does have left calf claudication.  Recent Dopplers of his carotids revealed a patent left internal carotid artery stent and with his lower extremities reveal moderate in-stent restenosis within his left SFA stent.  He does complain of some left calf claudication.   Current Meds  Medication Sig   allopurinol (ZYLOPRIM) 100 MG tablet TAKE 1 TABLET BY MOUTH DAILY.   aspirin EC 81 MG tablet Take 1 tablet (81 mg total) by mouth daily.   atorvastatin (LIPITOR) 40 MG tablet Take 1 tablet (40 mg total) by mouth every evening.   Blood Glucose Monitoring Suppl (ONE TOUCH ULTRA MINI) w/Device KIT    Brimonidine Tartrate (LUMIFY OP) Apply 1 drop to eye daily as needed (redness).   cetirizine (ZYRTEC) 10 MG tablet Take 10 mg by mouth daily as needed for allergies.    clopidogrel (PLAVIX) 75 MG tablet TAKE 1 TABLET (75 MG TOTAL) BY MOUTH DAILY.   gabapentin (NEURONTIN) 300 MG capsule Take 300 mg by mouth at bedtime.    JANUMET 50-500 MG tablet Take 1 tablet by mouth 2 (two) times daily.    latanoprost (XALATAN) 0.005 % ophthalmic solution Place 1 drop into both eyes at bedtime.    lisinopril (ZESTRIL) 5 MG tablet TAKE ONE (1) TABLET BY MOUTH EVERY DAY   ONE TOUCH ULTRA TEST test strip    ONETOUCH DELICA LANCETS 28U MISC    VIAGRA 100 MG tablet TAKE 1 TABLET BY MOUTH DAILY AS NEEDED FOR ERECTILE DYSFUNCTION     No Known Allergies  Social History   Socioeconomic History   Marital status: Married    Spouse name: Anthony Brock   Number of children: 2   Years of education: 15   Highest education level: Not on file  Occupational History   Occupation: [employer], Materials management  Tobacco Use   Smoking status: Former    Packs/day: 0.50    Years: 45.00    Total pack years: 22.50    Types: Cigarettes    Quit date: 03/01/2015    Years since quitting: 6.9   Smokeless tobacco: Never   Tobacco comments:    02/25/2015 "stopped  smoking 02/14/2015"  Vaping Use   Vaping Use: Never used  Substance and Sexual Activity   Alcohol use: Yes    Alcohol/week: 0.0 standard drinks of alcohol    Comment: 12/21/2016 "might have a couple drinks/year"   Drug use: Yes    Comment: "might have used some drugs in my 20's"   Sexual activity: Yes  Other Topics Concern   Not on file  Social History Narrative   Lives with wife   caffeine- coffee, 1-2 cups daily   Social Determinants of Health   Financial Resource Strain: Not on file  Food Insecurity: Not on file  Transportation Needs: Not on file  Physical Activity: Not on file  Stress: Not on file  Social Connections: Not on file  Intimate Partner Violence: Not on file     Review of Systems: General: negative for chills, fever, night sweats or weight changes.  Cardiovascular: negative for chest pain, dyspnea on exertion, edema, orthopnea, palpitations, paroxysmal nocturnal dyspnea or shortness of breath Dermatological: negative for rash Respiratory: negative for cough or wheezing Urologic: negative for hematuria Abdominal: negative for nausea, vomiting, diarrhea, bright red blood per rectum, melena, or hematemesis Neurologic: negative for visual changes, syncope, or dizziness All  other systems reviewed and are otherwise negative except as noted above.    Blood pressure 134/76, pulse 72, height _0  (1.854 m), weight 178 lb 12.8 oz (81.1 kg), SpO2 97 %.  General appearance: alert and no distress Neck: no adenopathy, no carotid bruit, no JVD, supple, symmetrical, trachea midline, and thyroid not enlarged, symmetric, no tenderness/mass/nodules Lungs: clear to auscultation bilaterally Heart: regular rate and rhythm, S1, S2 normal, no murmur, click, rub or gallop Extremities: extremities normal, atraumatic, no cyanosis or edema Pulses: 2+ and symmetric Skin: Skin color, texture, turgor normal. No rashes or lesions Neurologic: Grossly normal  EKG sinus rhythm at 65 with  left anterior fascicular block.  I personally reviewed this EKG.  ASSESSMENT AND PLAN:   Essential hypertension History of essential hypertension a blood pressure measured today at 134/76.  He is on lisinopril and Cardizem.  Lt ICA stenosis- Carotid stent placed 07/17/14 History of carotid artery disease status post left internal carotid artery stenting by myself/5/16.  Carotid Dopplers recently performed 11/06/2021 revealed this to be widely patent.  We will continue to follow this on an annual basis.  PAD (peripheral artery disease) (HCC) History of peripheral arterial disease status post multiple interventions on both SFAs due to restenosis.  His most recent intervention was 10//18 with angioplasty of the mid left SFA stent and intervention on a more proximal lesion.  He has had intervention of his right SFA 04/20/2016.  He has mild left calf claudication.  Recent Doppler studies performed 02/09/2022 revealed a right ABI of 0.82 and a left of 0.78.  He did have a patent right SFA stent with a high-frequency signal in his mid left SFA stent suggesting in-stent restenosis.  At this point, his symptoms are not severe enough to warrant intervention but we will continue to follow him noninvasively.     Anthony Harp MD FACP,FACC,FAHA, [facility] 02/18/2022 12:46 PM

## 2022-02-24 LAB — LIPID PANEL
Chol/HDL Ratio: 3.2 ratio (ref 0.0–5.0)
Cholesterol, Total: 142 mg/dL (ref 100–199)
HDL: 44 mg/dL (ref >39)
LDL Chol Calc (NIH): 75 mg/dL (ref 0–99)
Triglycerides: 127 mg/dL (ref 0–149)
VLDL Cholesterol Cal: 23 mg/dL (ref 5–40)

## 2022-02-24 LAB — HEPATIC FUNCTION PANEL
ALT: 14 IU/L (ref 0–44)
AST: 19 IU/L (ref 0–40)
Albumin: 4.6 g/dL (ref 3.9–4.9)
Alkaline Phosphatase: 85 IU/L (ref 44–121)
Bilirubin Total: 0.4 mg/dL (ref 0.0–1.2)
Bilirubin, Direct: 0.14 mg/dL (ref 0.00–0.40)
Total Protein: 7.5 g/dL (ref 6.0–8.5)

## 2022-08-22 LAB — LAB REPORT - SCANNED
A1c: 6.7
EGFR: 78

## 2022-09-01 ENCOUNTER — Encounter: Payer: Self-pay | Admitting: Podiatry

## 2022-09-01 ENCOUNTER — Ambulatory Visit (INDEPENDENT_AMBULATORY_CARE_PROVIDER_SITE_OTHER): Payer: PPO | Admitting: Podiatry

## 2022-09-01 ENCOUNTER — Ambulatory Visit (INDEPENDENT_AMBULATORY_CARE_PROVIDER_SITE_OTHER): Payer: PPO

## 2022-09-01 DIAGNOSIS — M79672 Pain in left foot: Secondary | ICD-10-CM | POA: Diagnosis not present

## 2022-09-01 DIAGNOSIS — M10071 Idiopathic gout, right ankle and foot: Secondary | ICD-10-CM

## 2022-09-01 DIAGNOSIS — E1151 Type 2 diabetes mellitus with diabetic peripheral angiopathy without gangrene: Secondary | ICD-10-CM

## 2022-09-01 DIAGNOSIS — M10072 Idiopathic gout, left ankle and foot: Secondary | ICD-10-CM

## 2022-09-01 DIAGNOSIS — M79671 Pain in right foot: Secondary | ICD-10-CM

## 2022-09-01 DIAGNOSIS — M778 Other enthesopathies, not elsewhere classified: Secondary | ICD-10-CM

## 2022-09-01 DIAGNOSIS — M2141 Flat foot [pes planus] (acquired), right foot: Secondary | ICD-10-CM

## 2022-09-01 DIAGNOSIS — M21619 Bunion of unspecified foot: Secondary | ICD-10-CM

## 2022-09-01 DIAGNOSIS — M2142 Flat foot [pes planus] (acquired), left foot: Secondary | ICD-10-CM

## 2022-09-01 NOTE — Progress Notes (Signed)
Subjective:   Patient ID: Anthony Brock, male   DOB: 67 y.o.   MRN: [id number]   HPI Chief Complaint  Patient presents with   Foot Pain    Bilateral foot pain reported by patient right foot feels like gout and left foot has a shooting pain across the top portion of the foot.   67 year old male presents the office for above concerns.  States that on the left leg his pain is treated across top of his foot and he also gets pain along the first lesion that he points to the area of the bunions.  He states he has been having gout throughout 1 year on the right last 3 to 4 months on the left.  Tried colchicine previously which has been helping.  No recent injuries.  He also has flatfeet.  He follows with Dr. Allyson Sabal for PAD.    Review of Systems  All other systems reviewed and are negative.  Past Medical History:  Diagnosis Date   Abnormal EKG    Arthritis    "left ankle" (02/14/2015)   Carotid arterial disease (HCC)    a. 80% LICA by PV angio 04/2014.   Essential hypertension    GERD (gastroesophageal reflux disease)    High cholesterol    Hyperglycemia    PAD (peripheral artery disease) (HCC)    a. s/p PTA to L SFA with drug-eluting balloon angioplasty 04/30/14. b. balloon angiography of R SFA PTA 05/24/2014. c. Atherectomy w/ PTA R-SFA 02/14/2015; d. Atherectomy w/ drug-eluting PTA L-SFA 02/25/2015   Presence of stent in L Internal Carotid 07/18/2014    7 mm/9 mm x 30 mm Xact Nitinol Self-Expanding Stent    Shingles 07/17/2016   Type II diabetes mellitus (HCC)     Past Surgical History:  Procedure Laterality Date   CAROTID ANGIOGRAM N/A 04/30/2014   Procedure: CAROTID ANGIOGRAM;  Surgeon: Runell Gess, MD;  Location: [facility] CATH LAB;  Service: Cardiovascular;  Laterality: N/A;   CAROTID STENT INSERTION N/A 07/17/2014   Procedure: CAROTID STENT INSERTION;  Surgeon: Runell Gess, MD;  Location: [facility] CATH LAB;   7 mm/9 mm x 30 mm Xact Nitinol Self-Expanding Stent   INGUINAL HERNIA REPAIR Left  07/28/2016   Procedure: LAPAROSCOPIC BILATERAL INGUINAL HERNIA REPAIR LEFT;  Surgeon: Karie Soda, MD;  Location: [facility] ORS;  Service: General;  Laterality: Left;   INGUINAL HERNIA REPAIR Right 2004   INGUINAL HERNIA REPAIR Left 07/2016   INSERTION OF MESH Left 07/28/2016   Procedure: INSERTION OF MESH;  Surgeon: Karie Soda, MD;  Location: [facility] ORS;  Service: General;  Laterality: Left;   LOWER EXTREMITY ANGIOGRAM N/A 04/30/2014   Procedure: LOWER EXTREMITY ANGIOGRAM;  Surgeon: Runell Gess, MD;  Location: [facility] CATH LAB;  Service: Cardiovascular;  Laterality: N/A;   LOWER EXTREMITY ANGIOGRAM Right 02/14/2015   LOWER EXTREMITY ANGIOGRAPHY N/A 12/21/2016   Procedure: Lower Extremity Angiography;  Surgeon: Runell Gess, MD;  Location: [facility] INVASIVE CV LAB;  Service: Cardiovascular;  Laterality: N/A;   LOWER EXTREMITY INTERVENTION N/A 01/14/2017   Procedure: LOWER EXTREMITY INTERVENTION;  Surgeon: Runell Gess, MD;  Location: [facility] INVASIVE CV LAB;  Service: Cardiovascular;  Laterality: N/A;   PERIPHERAL VASCULAR CATHETERIZATION Left 04/30/2014   Procedure: PERIPHERAL VASCULAR ATHERECTOMY;  Surgeon: Runell Gess, MD;  Location: [facility] CATH LAB;  Service: Cardiovascular;  Laterality: Left;  lt SFA   PERIPHERAL VASCULAR CATHETERIZATION Bilateral 02/14/2015   Procedure: Lower Extremity Angiography;  Surgeon: Runell Gess, MD;  Location: [facility] INVASIVE CV LAB;  Service: Cardiovascular;  Laterality: Bilateral;   PERIPHERAL VASCULAR CATHETERIZATION Right 02/14/2015   Procedure: Peripheral Vascular Atherectomy;  Surgeon: Runell Gess, MD;  Location: [facility] INVASIVE CV LAB;  Service: Cardiovascular;  Laterality: Right;  SFA PTA DRUG COATED   PERIPHERAL VASCULAR CATHETERIZATION Left 02/25/2015   Procedure: Peripheral Vascular Atherectomy;  Surgeon: Runell Gess, MD;  Location: [facility] INVASIVE CV LAB;  Service: Cardiovascular;  Laterality: Left;  PTA   PERIPHERAL VASCULAR CATHETERIZATION Bilateral 03/23/2016    Procedure: Lower Extremity Intervention;  Surgeon: Runell Gess, MD;  Location: [facility] INVASIVE CV LAB;  Service: Cardiovascular;  Laterality: Bilateral;   PERIPHERAL VASCULAR CATHETERIZATION Left 03/23/2016   Procedure: Peripheral Vascular Intervention;  Surgeon: Runell Gess, MD;  Location: [facility] INVASIVE CV LAB;  Service: Cardiovascular;  Laterality: Left;   PERIPHERAL VASCULAR CATHETERIZATION Right 04/20/2016   Procedure: Peripheral Vascular Intervention;  Surgeon: Runell Gess, MD;  Location: [facility] INVASIVE CV LAB;  Service: Cardiovascular;  Laterality: Right;  SFA   PERIPHERAL VASCULAR INTERVENTION  12/21/2016   Procedure: PERIPHERAL VASCULAR INTERVENTION;  Surgeon: Runell Gess, MD;  Location: [facility] INVASIVE CV LAB;  Service: Cardiovascular;;  Right Lower Extremity   TRANSLUMINAL ATHERECTOMY FEMORAL ARTERY Right 05/24/2014   SFA/notes 05/24/2014     Current Outpatient Medications:    allopurinol (ZYLOPRIM) 100 MG tablet, TAKE 1 TABLET BY MOUTH DAILY., Disp: 30 tablet, Rfl: 0   aspirin EC 81 MG tablet, Take 1 tablet (81 mg total) by mouth daily., Disp: , Rfl:    atorvastatin (LIPITOR) 40 MG tablet, Take 1 tablet (40 mg total) by mouth every evening., Disp: 90 tablet, Rfl: 0   atorvastatin (LIPITOR) 40 MG tablet, TAKE 1 TABLET BY MOUTH DAILY., Disp: 90 tablet, Rfl: 0   Blood Glucose Monitoring Suppl (ONE TOUCH ULTRA MINI) w/Device KIT, , Disp: , Rfl:    Brimonidine Tartrate (LUMIFY OP), Apply 1 drop to eye daily as needed (redness)., Disp: , Rfl:    cetirizine (ZYRTEC) 10 MG tablet, Take 10 mg by mouth daily as needed for allergies. , Disp: , Rfl:    clopidogrel (PLAVIX) 75 MG tablet, TAKE 1 TABLET (75 MG TOTAL) BY MOUTH DAILY., Disp: 90 tablet, Rfl: 0   Colchicine 0.6 MG CAPS, TAKE 1 CAPSULE (0.6 MG) BY MOUTH EVERY 12 HOURS FOR 3 DAYS, Disp: 6 capsule, Rfl: 3   Colchicine 0.6 MG CAPS, TAKE 1 CAPSULE BY MOUTH EVERY HOUR UNTIL GOUT ATTACK RESOLVED. MAXIMUM OF 3 CAPSULES PER DAY., Disp: 30  capsule, Rfl: 0   diltiazem (CARDIZEM CD) 120 MG 24 hr capsule, TAKE 1 CAPSULE BY MOUTH DAILY, Disp: 30 capsule, Rfl: 8   gabapentin (NEURONTIN) 300 MG capsule, Take 300 mg by mouth at bedtime. , Disp: , Rfl:    gabapentin (NEURONTIN) 300 MG capsule, TAKE 1 CAPSULE (300 MG) BY MOUTH DAILY AS NEEDED., Disp: 90 capsule, Rfl: 0   JANUMET 50-500 MG tablet, Take 1 tablet by mouth 2 (two) times daily. , Disp: , Rfl:    latanoprost (XALATAN) 0.005 % ophthalmic solution, Place 1 drop into both eyes at bedtime. , Disp: , Rfl:    lisinopril (ZESTRIL) 10 MG tablet, TAKE 1 TABLET (10 MG) BY MOUTH DAILY, Disp: 30 tablet, Rfl: 0   lisinopril (ZESTRIL) 5 MG tablet, TAKE ONE (1) TABLET BY MOUTH EVERY DAY, Disp: 90 tablet, Rfl: 0   ONE TOUCH ULTRA TEST test strip, , Disp: , Rfl:    ONETOUCH DELICA LANCETS 33G  MISC, , Disp: , Rfl:    VIAGRA 100 MG tablet, TAKE 1 TABLET BY MOUTH DAILY AS NEEDED FOR ERECTILE DYSFUNCTION, Disp: 10 tablet, Rfl: 0  No Known Allergies        Objective:  Physical Exam  General: AAO x3, NAD  Dermatological: Skin is warm, dry and supple bilateral. There are no open sores, no preulcerative lesions, no rash or signs of infection present.  Vascular: Dorsalis Pedis artery and Posterior Tibial artery pedal pulses are 1/4 bilateral with immedate capillary fill time.  There is no pain with calf compression, swelling, warmth, erythema.   Neruologic: Grossly intact via light touch bilateral.   Musculoskeletal: Moderate bunion b/l. Flatfoot b/l.  Subjectively has been discomfort on the midfoot Lisfranc joint as well as the area but has been the significant area pinpoint tenderness noted today.  Gait: Unassisted, Nonantalgic.       Assessment:   67 year old male with right foot bunion/capsulitis with history of gout, PAD     Plan:  -Treatment options discussed including all alternatives, risks, and complications -Etiology of symptoms were discussed -X-rays were obtained and  reviewed with the patient.  3 views of the feet were obtained bilaterally.  Decreased calcaneal inclination angle.  Moderate bunion is present.  No evidence of acute fracture. -Will check uric acid level.  If needed possibly restart allopurinol. -I do think some of the symptoms are coming from flatfoot, bunions and think she is good arch support will be beneficial for him.  He was measured for diabetic shoes today. -Continue to follow up with Dr. Allyson Sabal for PAD  Vivi Barrack DPM

## 2022-09-01 NOTE — Patient Instructions (Signed)

## 2022-09-01 NOTE — Progress Notes (Unsigned)
Patient presents today to casted for diabetic shoes and insoles.  Patient was casted in Foam box to be sent to [facility] to have custom insoles made.   Patient picked out 3 pairs of shoes incase his first choice is not available.   1st choice 613 Orthofeet size 13 ordered  2nd choice [id number] New Balance  3rd Choice [id number]  Wt is 176lbs  Shoe size is 12.5 Medium Provider Dr Ovid Curd  Insurance HTA   Diabetes Physician is Dr Nadyne Coombes  Order entered  09/01/22 to safestep, casting foam shipped to [facility], will call patient when we received paperwork back from Dr Hal Hope office.    Re-appointment for regularly scheduled diabetic foot care visits or if they should experience any trouble with the shoes or insoles.

## 2022-09-11 ENCOUNTER — Other Ambulatory Visit: Payer: Self-pay | Admitting: Podiatry

## 2022-09-11 DIAGNOSIS — M778 Other enthesopathies, not elsewhere classified: Secondary | ICD-10-CM

## 2022-09-11 DIAGNOSIS — M79671 Pain in right foot: Secondary | ICD-10-CM

## 2022-09-11 DIAGNOSIS — M2141 Flat foot [pes planus] (acquired), right foot: Secondary | ICD-10-CM

## 2022-09-11 DIAGNOSIS — M10071 Idiopathic gout, right ankle and foot: Secondary | ICD-10-CM

## 2022-09-11 DIAGNOSIS — M21619 Bunion of unspecified foot: Secondary | ICD-10-CM

## 2022-09-11 DIAGNOSIS — E1151 Type 2 diabetes mellitus with diabetic peripheral angiopathy without gangrene: Secondary | ICD-10-CM

## 2022-09-11 DIAGNOSIS — M10072 Idiopathic gout, left ankle and foot: Secondary | ICD-10-CM

## 2022-09-21 ENCOUNTER — Ambulatory Visit
Admission: EM | Admit: 2022-09-21 | Discharge: 2022-09-21 | Disposition: A | Discharge status: Home / Self Care | Payer: PPO | Attending: Internal Medicine | Admitting: Internal Medicine

## 2022-09-21 DIAGNOSIS — S90562A Insect bite (nonvenomous), left ankle, initial encounter: Secondary | ICD-10-CM

## 2022-09-21 DIAGNOSIS — W57XXXA Bitten or stung by nonvenomous insect and other nonvenomous arthropods, initial encounter: Secondary | ICD-10-CM | POA: Diagnosis not present

## 2022-09-21 DIAGNOSIS — L03116 Cellulitis of left lower limb: Secondary | ICD-10-CM

## 2022-09-21 MED ORDER — CEPHALEXIN 500 MG PO CAPS: 500.0000 mg | ORAL_CAPSULE | Freq: Four times a day (QID) | ORAL | 0 refills | Status: AC

## 2022-09-21 NOTE — Discharge Instructions (Addendum)
I have prescribed you an antibiotic given concern of the skin infection related to a possible insect bite.  Use cool compresses as well.  Follow-up if any symptoms persist or worsen.

## 2022-09-21 NOTE — ED Provider Notes (Addendum)
[facility]    CSN: [id number] Arrival date & time: 09/21/22  1148      History   Chief Complaint Chief Complaint  Patient presents with   Insect Bite    HPI Anthony Brock is a 67 y.o. male.   Patient presents today with concerns of itchiness, pain, swelling to medial left ankle that has been present for about 6 days.  Patient reports that it started the day after he was weed eating out in the yard.  Reports that he was wearing tennis shoes but did not feel or see anything bite him or sting him.  Patient reports that he has been applying topical anti-itch cream with minimal improvement.  Reports that it was only itchy at first but is now increased with pain and swelling.  He is concerned for snakebite although reports that he did not see or feel a snake bite him.  Denies any associated fever.     Past Medical History:  Diagnosis Date   Abnormal EKG    Arthritis    "left ankle" (02/14/2015)   Carotid arterial disease (HCC)    a. 80% LICA by PV angio 04/2014.   Essential hypertension    GERD (gastroesophageal reflux disease)    High cholesterol    Hyperglycemia    PAD (peripheral artery disease) (HCC)    a. s/p PTA to L SFA with drug-eluting balloon angioplasty 04/30/14. b. balloon angiography of R SFA PTA 05/24/2014. c. Atherectomy w/ PTA R-SFA 02/14/2015; d. Atherectomy w/ drug-eluting PTA L-SFA 02/25/2015   Presence of stent in L Internal Carotid 07/18/2014    7 mm/9 mm x 30 mm Xact Nitinol Self-Expanding Stent    Shingles 07/17/2016   Type II diabetes mellitus [facility])     Patient Active Problem List   Diagnosis Date Noted   Nonsustained ventricular tachycardia (HCC) 02/02/2017   Dyspnea on exertion 02/02/2017   Claudication in peripheral vascular disease (HCC) 12/21/2016   Direct left inguinal hernia s/p lap repair with mesh 07/28/2016 07/28/2016   PVD (peripheral vascular disease) (HCC) 04/20/2016   Diabetes mellitus type 2 in nonobese (HCC) 07/18/2015    Abnormal EKG 05/15/2015   Presence of stent in L Internal Carotid 07/18/2014    Class: Status post   Hyperlipidemia with target LDL less than 70 07/18/2014   Carotid artery disease (HCC) 07/17/2014   Essential hypertension    Hyperglycemia    Lt ICA stenosis- Carotid stent placed 07/17/14    Tobacco abuse    PAD (peripheral artery disease) (HCC)    Claudication (HCC) 04/19/2014    Past Surgical History:  Procedure Laterality Date   CAROTID ANGIOGRAM N/A 04/30/2014   Procedure: CAROTID ANGIOGRAM;  Surgeon: Runell Gess, MD;  Location: [facility] CATH LAB;  Service: Cardiovascular;  Laterality: N/A;   CAROTID STENT INSERTION N/A 07/17/2014   Procedure: CAROTID STENT INSERTION;  Surgeon: Runell Gess, MD;  Location: [facility] CATH LAB;   7 mm/9 mm x 30 mm Xact Nitinol Self-Expanding Stent   INGUINAL HERNIA REPAIR Left 07/28/2016   Procedure: LAPAROSCOPIC BILATERAL INGUINAL HERNIA REPAIR LEFT;  Surgeon: Karie Soda, MD;  Location: [facility] ORS;  Service: General;  Laterality: Left;   INGUINAL HERNIA REPAIR Right 2004   INGUINAL HERNIA REPAIR Left 07/2016   INSERTION OF MESH Left 07/28/2016   Procedure: INSERTION OF MESH;  Surgeon: Karie Soda, MD;  Location: [facility] ORS;  Service: General;  Laterality: Left;   LOWER EXTREMITY ANGIOGRAM N/A 04/30/2014   Procedure: LOWER  EXTREMITY ANGIOGRAM;  Surgeon: Runell Gess, MD;  Location: [facility] CATH LAB;  Service: Cardiovascular;  Laterality: N/A;   LOWER EXTREMITY ANGIOGRAM Right 02/14/2015   LOWER EXTREMITY ANGIOGRAPHY N/A 12/21/2016   Procedure: Lower Extremity Angiography;  Surgeon: Runell Gess, MD;  Location: [facility] INVASIVE CV LAB;  Service: Cardiovascular;  Laterality: N/A;   LOWER EXTREMITY INTERVENTION N/A 01/14/2017   Procedure: LOWER EXTREMITY INTERVENTION;  Surgeon: Runell Gess, MD;  Location: [facility] INVASIVE CV LAB;  Service: Cardiovascular;  Laterality: N/A;   PERIPHERAL VASCULAR CATHETERIZATION Left 04/30/2014   Procedure: PERIPHERAL VASCULAR ATHERECTOMY;   Surgeon: Runell Gess, MD;  Location: [facility] CATH LAB;  Service: Cardiovascular;  Laterality: Left;  lt SFA   PERIPHERAL VASCULAR CATHETERIZATION Bilateral 02/14/2015   Procedure: Lower Extremity Angiography;  Surgeon: Runell Gess, MD;  Location: [facility] INVASIVE CV LAB;  Service: Cardiovascular;  Laterality: Bilateral;   PERIPHERAL VASCULAR CATHETERIZATION Right 02/14/2015   Procedure: Peripheral Vascular Atherectomy;  Surgeon: Runell Gess, MD;  Location: [facility] INVASIVE CV LAB;  Service: Cardiovascular;  Laterality: Right;  SFA PTA DRUG COATED   PERIPHERAL VASCULAR CATHETERIZATION Left 02/25/2015   Procedure: Peripheral Vascular Atherectomy;  Surgeon: Runell Gess, MD;  Location: [facility] INVASIVE CV LAB;  Service: Cardiovascular;  Laterality: Left;  PTA   PERIPHERAL VASCULAR CATHETERIZATION Bilateral 03/23/2016   Procedure: Lower Extremity Intervention;  Surgeon: Runell Gess, MD;  Location: [facility] INVASIVE CV LAB;  Service: Cardiovascular;  Laterality: Bilateral;   PERIPHERAL VASCULAR CATHETERIZATION Left 03/23/2016   Procedure: Peripheral Vascular Intervention;  Surgeon: Runell Gess, MD;  Location: [facility] INVASIVE CV LAB;  Service: Cardiovascular;  Laterality: Left;   PERIPHERAL VASCULAR CATHETERIZATION Right 04/20/2016   Procedure: Peripheral Vascular Intervention;  Surgeon: Runell Gess, MD;  Location: [facility] INVASIVE CV LAB;  Service: Cardiovascular;  Laterality: Right;  SFA   PERIPHERAL VASCULAR INTERVENTION  12/21/2016   Procedure: PERIPHERAL VASCULAR INTERVENTION;  Surgeon: Runell Gess, MD;  Location: [facility] INVASIVE CV LAB;  Service: Cardiovascular;;  Right Lower Extremity   TRANSLUMINAL ATHERECTOMY FEMORAL ARTERY Right 05/24/2014   SFA/notes 05/24/2014       Home Medications    Prior to Admission medications   Medication Sig Start Date End Date Taking? Authorizing Provider  cephALEXin (KEFLEX) 500 MG capsule Take 1 capsule (500 mg total) by mouth 4 (four) times daily for 5 days.  09/21/22 09/26/22 Yes Ernst Cumpston, Acie Fredrickson, FNP  allopurinol (ZYLOPRIM) 100 MG tablet TAKE 1 TABLET BY MOUTH DAILY. 05/10/20 02/18/22  Dois Davenport, MD  aspirin EC 81 MG tablet Take 1 tablet (81 mg total) by mouth daily. 07/18/14   Abelino Derrick, PA-C  atorvastatin (LIPITOR) 40 MG tablet Take 1 tablet (40 mg total) by mouth every evening. 12/01/18   Runell Gess, MD  atorvastatin (LIPITOR) 40 MG tablet TAKE 1 TABLET BY MOUTH DAILY. 01/22/20 01/21/21  Dois Davenport, MD  Blood Glucose Monitoring Suppl (ONE TOUCH ULTRA MINI) w/Device KIT  07/17/15   [provider]  Brimonidine Tartrate (LUMIFY OP) Apply 1 drop to eye daily as needed (redness).    [provider]  cetirizine (ZYRTEC) 10 MG tablet Take 10 mg by mouth daily as needed for allergies.     [provider]  clopidogrel (PLAVIX) 75 MG tablet TAKE 1 TABLET (75 MG TOTAL) BY MOUTH DAILY. 08/17/19   Runell Gess, MD  Colchicine 0.6 MG CAPS TAKE 1 CAPSULE (0.6 MG) BY MOUTH EVERY 12 HOURS FOR 3 DAYS  05/30/20 05/30/21  Dois Davenport, MD  Colchicine 0.6 MG CAPS TAKE 1 CAPSULE BY MOUTH EVERY HOUR UNTIL GOUT ATTACK RESOLVED. MAXIMUM OF 3 CAPSULES PER DAY. 05/27/20 05/27/21  Dois Davenport, MD  diltiazem (CARDIZEM CD) 120 MG 24 hr capsule TAKE 1 CAPSULE BY MOUTH DAILY 10/09/20 10/09/21  Runell Gess, MD  gabapentin (NEURONTIN) 300 MG capsule Take 300 mg by mouth at bedtime.  03/11/16   [provider]  gabapentin (NEURONTIN) 300 MG capsule TAKE 1 CAPSULE (300 MG) BY MOUTH DAILY AS NEEDED. 01/22/20 01/21/21  Dois Davenport, MD  JANUMET 50-500 MG tablet Take 1 tablet by mouth 2 (two) times daily.  03/11/16   [provider]  latanoprost (XALATAN) 0.005 % ophthalmic solution Place 1 drop into both eyes at bedtime.  11/17/16   [provider]  lisinopril (ZESTRIL) 10 MG tablet TAKE 1 TABLET (10 MG) BY MOUTH DAILY 05/10/20 05/10/21  Dois Davenport, MD  lisinopril (ZESTRIL) 5 MG tablet TAKE ONE (1)  TABLET BY MOUTH EVERY DAY 12/01/18   Runell Gess, MD  ONE TOUCH ULTRA TEST test strip  07/17/15   [provider]  Dola Argyle LANCETS 33G MISC  07/17/15   [provider]  VIAGRA 100 MG tablet TAKE 1 TABLET BY MOUTH DAILY AS NEEDED FOR ERECTILE DYSFUNCTION 11/03/16   Runell Gess, MD    Family History Family History  Problem Relation Age of Onset   Alzheimer's disease Mother    Dementia Mother    Emphysema Father    Alzheimer's disease Sister     Social History Social History   Tobacco Use   Smoking status: Former    Packs/day: 0.50    Years: 45.00    Additional pack years: 0.00    Total pack years: 22.50    Types: Cigarettes    Quit date: 03/01/2015    Years since quitting: 7.5   Smokeless tobacco: Never   Tobacco comments:    02/25/2015 "stopped smoking 02/14/2015"  Vaping Use   Vaping Use: Never used  Substance Use Topics   Alcohol use: Yes    Alcohol/week: 0.0 standard drinks of alcohol    Comment: 12/21/2016 "might have a couple drinks/year"   Drug use: Yes    Comment: "might have used some drugs in my 20's"     Allergies   Patient has no known allergies.   Review of Systems Review of Systems Per HPI  Physical Exam Triage Vital Signs ED Triage Vitals  Enc Vitals Group     BP 09/21/22 1233 (!) 154/88     Pulse Rate 09/21/22 1233 66     Resp 09/21/22 1233 18     Temp 09/21/22 1233 98.1 F (36.7 C)     Temp Source 09/21/22 1233 Oral     SpO2 09/21/22 1233 92 %     Weight --      Height --      Head Circumference --      Peak Flow --      Pain Score 09/21/22 1236 7     Pain Loc --      Pain Edu? --      Excl. in GC? --    No data found.  Updated Vital Signs BP (!) 154/88 (BP Location: Left Arm)   Pulse 66   Temp 98.1 F (36.7 C) (Oral)   Resp 18   SpO2 92%   Visual Acuity Right Eye Distance:   Left  Eye Distance:   Bilateral Distance:    Right Eye Near:   Left Eye Near:    Bilateral Near:     Physical  Exam Constitutional:      General: He is not in acute distress.    Appearance: Normal appearance. He is not toxic-appearing or diaphoretic.  HENT:     Head: Normocephalic and atraumatic.  Eyes:     Extraocular Movements: Extraocular movements intact.     Conjunctiva/sclera: Conjunctivae normal.  Pulmonary:     Effort: Pulmonary effort is normal.  Skin:    Comments: Patient has 2 areas directly beside each other consistent with possible insect bite with surrounding warmth, erythema, mild swelling to left medial ankle.  Neurological:     General: No focal deficit present.     Mental Status: He is alert and oriented to person, place, and time. Mental status is at baseline.  Psychiatric:        Mood and Affect: Mood normal.        Behavior: Behavior normal.        Thought Content: Thought content normal.        Judgment: Judgment normal.      UC Treatments / Results  Labs (all labs ordered are listed, but only abnormal results are displayed) Labs Reviewed - No data to display  EKG   Radiology No results found.  Procedures Procedures (including critical care time)  Medications Ordered in UC Medications - No data to display  Initial Impression / Assessment and Plan / UC Course  I have reviewed the triage vital signs and the nursing notes.  Pertinent labs & imaging results that were available during my care of the patient were reviewed by me and considered in my medical decision making (see chart for details).     I am not concerned for snake bite given physical exam and that symptoms have been present for almost 1 week.  It appears the patient may have cellulitis from possible insect versus mosquito bite.  Will treat with cephalexin.  Advised cool compresses and supportive care.  Advised monitoring closely for any worsening symptoms and following up if they occur.  Creatinine clearance is 65 so no dosage adjustment necessary for cephalexin.  Patient verbalized understanding  and was agreeable with plan.  His oxygen saturation was 92% on initial triage.  Attempted to recheck with multiple different devices but it was not giving a good waveform.  Patient reports that he has circulation issues so suspect this is attributing to inaccurate readings.  He is not short of breath and has no tachypnea so no concern for emergent evaluation and patient is safe for discharge. Final Clinical Impressions(s) / UC Diagnoses   Final diagnoses:  Cellulitis of left ankle  Insect bite of left ankle, initial encounter     Discharge Instructions      I have prescribed you an antibiotic given concern of the skin infection related to a possible insect bite.  Use cool compresses as well.  Follow-up if any symptoms persist or worsen.    ED Prescriptions     Medication Sig Dispense Auth. Provider   cephALEXin (KEFLEX) 500 MG capsule Take 1 capsule (500 mg total) by mouth 4 (four) times daily for 5 days. 20 capsule Gustavus Bryant, [place]      PDMP not reviewed this encounter.   Gustavus Bryant, [place] 09/21/22 1259    [address] [place] 09/21/22 1325

## 2022-09-21 NOTE — ED Triage Notes (Signed)
Pt reports insect  bite in left ankle x 6 days.

## 2022-11-20 ENCOUNTER — Ambulatory Visit (HOSPITAL_COMMUNITY)
Admission: RE | Admit: 2022-11-20 | Discharge: 2022-11-20 | Disposition: A | Discharge status: Home / Self Care | Payer: PPO | Source: Ambulatory Visit | Attending: Cardiology | Admitting: Cardiology

## 2022-11-20 DIAGNOSIS — I6523 Occlusion and stenosis of bilateral carotid arteries: Secondary | ICD-10-CM

## 2022-12-03 ENCOUNTER — Ambulatory Visit: Payer: PPO | Admitting: Podiatry

## 2022-12-25 ENCOUNTER — Telehealth: Payer: Self-pay | Admitting: Podiatry

## 2022-12-25 NOTE — Telephone Encounter (Signed)
Pt called checking on status of diabetic shoes and upon checking we had received a message back stating the pt was not seen at the practice that the paperwork was faxed to. I refaxed it to the number for his pcp in [place]. I explained that once we get that paperwork back they will make his inserts.

## 2022-12-31 ENCOUNTER — Ambulatory Visit (INDEPENDENT_AMBULATORY_CARE_PROVIDER_SITE_OTHER): Payer: PPO | Admitting: Podiatry

## 2022-12-31 DIAGNOSIS — M10071 Idiopathic gout, right ankle and foot: Secondary | ICD-10-CM

## 2022-12-31 DIAGNOSIS — M21619 Bunion of unspecified foot: Secondary | ICD-10-CM

## 2022-12-31 DIAGNOSIS — M10072 Idiopathic gout, left ankle and foot: Secondary | ICD-10-CM

## 2022-12-31 DIAGNOSIS — M778 Other enthesopathies, not elsewhere classified: Secondary | ICD-10-CM

## 2022-12-31 DIAGNOSIS — E1151 Type 2 diabetes mellitus with diabetic peripheral angiopathy without gangrene: Secondary | ICD-10-CM | POA: Diagnosis not present

## 2022-12-31 NOTE — Progress Notes (Signed)
Subjective: Chief Complaint  Patient presents with   Foot Pain    Gout pain left foot. Has been giving trouble for a couple weeks now.  Has questions about colchicine.  Also here to pick up shoes and inserts.    The patient, with a history of gout, diabetes, and peripheral vascular disease with stents in both legs and the carotid artery, presents with ongoing pain across the top of the left foot. The pain, which began prior to their last visit, is exacerbated by walking and occasionally occurs at rest. They deny recent foot injuries. They have been experiencing a gout flare and are currently taking Colchicine. They were previously on Allopurinol, but it was not effective and was switched to Colchicine by Dr. Hal Hope. The patient also has a history of needing arch supports, but there was an issue with the order and they have not yet received their new shoes.  Objective: AAO x3, NAD DP/PT pulses palpable bilaterally, CRT less than 3 seconds There is decreased medial arch upon weightbearing.  There is tenderness on the dorsal aspect of the midfoot but no specific area pinpoint tenderness.  There is no edema, erythema or any clinical signs of acute gout at this time. Bunion and tailors bunion present.  No pain with calf compression, swelling, warmth, erythema  Assessment:  Plan: Gout Recent flare with pain across the top of the left foot. Currently on Colchicine. Discussed the difference between acute treatment (Colchicine) and preventative treatment (Allopurinol, Uloric). -Check uric acid level. -Consider resuming Allopurinol or Uloric if uric acid level is high.  Foot Pain Pain across the top of the left foot, possibly related to flat arches. Diabetic shoes have been ordered but not yet received. -Provide temporary off-the-shelf insert for relief until custom shoes arrive. [employer] inserts dispensed.   Peripheral Vascular Disease History of stents in both legs. Decreased pulse palpable in  left foot. -Scheduled for vascular ultrasound on 02/19/2023. -Avoid foot injection due to potential risk with decreased circulation.  Vivi Barrack DPM   ---  Order for shoes was not found on [employer] website  I placed order again  [facility]tho feet 613 / 13 MD  3 pair of custom Inserts  Patient may have to be seen for new impressions if [employer] doesn't have  Patient to be fit upon receipt  Addison Bailey CPed, CFo, CFm

## 2023-01-04 ENCOUNTER — Ambulatory Visit: Payer: PPO | Admitting: Podiatry

## 2023-01-07 NOTE — Progress Notes (Signed)
[employer] cancelled order again due to incorrect info on PCP  Updated and re-ordered again  Anthony Brock CPed, CFo, CFm

## 2023-01-14 LAB — LAB REPORT - SCANNED
A1c: 6.4
EGFR: 83
PSA, Total: 0.13

## 2023-01-20 ENCOUNTER — Telehealth: Payer: Self-pay | Admitting: Podiatry

## 2023-01-20 NOTE — Telephone Encounter (Signed)
Pt called checking on uric acid results.

## 2023-01-22 NOTE — Telephone Encounter (Signed)
Pt had labs sent over and he is wanting to see if you received the uric acid lab.

## 2023-02-02 ENCOUNTER — Telehealth: Payer: Self-pay

## 2023-02-02 NOTE — Telephone Encounter (Signed)
Patient would like to know status of diabetic shoes

## 2023-02-04 NOTE — Telephone Encounter (Signed)
Items are shipped will be here tmrw called patient set appt for 10/28 2:15

## 2023-02-08 ENCOUNTER — Ambulatory Visit (INDEPENDENT_AMBULATORY_CARE_PROVIDER_SITE_OTHER): Payer: PPO

## 2023-02-08 DIAGNOSIS — M21619 Bunion of unspecified foot: Secondary | ICD-10-CM | POA: Diagnosis not present

## 2023-02-08 DIAGNOSIS — E1151 Type 2 diabetes mellitus with diabetic peripheral angiopathy without gangrene: Secondary | ICD-10-CM

## 2023-02-08 DIAGNOSIS — M778 Other enthesopathies, not elsewhere classified: Secondary | ICD-10-CM

## 2023-02-08 DIAGNOSIS — M79671 Pain in right foot: Secondary | ICD-10-CM

## 2023-02-08 DIAGNOSIS — M2141 Flat foot [pes planus] (acquired), right foot: Secondary | ICD-10-CM

## 2023-02-08 DIAGNOSIS — M79672 Pain in left foot: Secondary | ICD-10-CM

## 2023-02-08 DIAGNOSIS — M2142 Flat foot [pes planus] (acquired), left foot: Secondary | ICD-10-CM

## 2023-02-08 NOTE — Progress Notes (Addendum)
Patient presents today to pick up diabetic shoes and insoles.  Patient was dispensed 1 pair of diabetic shoes and 3 pairs of foam casted diabetic insoles. Fit was satisfactory. Instructions for break-in and wear was reviewed and a copy was given to the patient.   Re-appointment for regularly scheduled diabetic foot care visits or if they should experience any trouble with the shoes or insoles.  Addison Bailey Cped, CFo, CFm Sent request for auth to [employer]  02/17/23 auth received [id number]  02/08/2023-05/09/2023

## 2023-02-10 ENCOUNTER — Ambulatory Visit (HOSPITAL_COMMUNITY): Payer: PPO

## 2023-02-10 ENCOUNTER — Ambulatory Visit (HOSPITAL_COMMUNITY)
Admission: RE | Admit: 2023-02-10 | Payer: PPO | Source: Ambulatory Visit | Attending: Cardiovascular Disease | Admitting: Cardiovascular Disease

## 2023-02-10 ENCOUNTER — Telehealth: Payer: Self-pay | Admitting: Cardiovascular Disease

## 2023-02-10 DIAGNOSIS — I739 Peripheral vascular disease, unspecified: Secondary | ICD-10-CM

## 2023-02-10 NOTE — Addendum Note (Signed)
Addended by: Serita Sheller R on: 02/10/2023 11:11 AM   Modules accepted: Orders

## 2023-02-10 NOTE — Telephone Encounter (Signed)
Pt needs to reschedule both appt's for today because he go the dates mixed up. The order expires on 11/8. Please advise

## 2023-02-10 NOTE — Telephone Encounter (Signed)
Order placed

## 2023-02-17 ENCOUNTER — Encounter (HOSPITAL_COMMUNITY): Payer: PPO

## 2023-02-19 ENCOUNTER — Ambulatory Visit (HOSPITAL_BASED_OUTPATIENT_CLINIC_OR_DEPARTMENT_OTHER)
Admission: RE | Admit: 2023-02-19 | Discharge: 2023-02-19 | Disposition: A | Discharge status: Home / Self Care | Payer: PPO | Source: Ambulatory Visit | Attending: Cardiovascular Disease | Admitting: Cardiovascular Disease

## 2023-02-19 ENCOUNTER — Ambulatory Visit (HOSPITAL_COMMUNITY)
Admission: RE | Admit: 2023-02-19 | Discharge: 2023-02-19 | Disposition: A | Discharge status: Home / Self Care | Payer: PPO | Source: Ambulatory Visit | Attending: Cardiovascular Disease | Admitting: Cardiovascular Disease

## 2023-02-19 DIAGNOSIS — I739 Peripheral vascular disease, unspecified: Secondary | ICD-10-CM | POA: Diagnosis present

## 2023-02-20 LAB — VAS US ABI WITH/WO TBI
Left ABI: 0.87
Right ABI: 0.92

## 2023-03-23 ENCOUNTER — Encounter: Payer: Self-pay | Admitting: Cardiovascular Disease

## 2023-03-23 ENCOUNTER — Ambulatory Visit: Payer: PPO | Attending: Cardiovascular Disease | Admitting: Cardiovascular Disease

## 2023-03-23 VITALS — BP 142/62 | HR 72 | Ht 73.0 in | Wt 170.0 lb

## 2023-03-23 DIAGNOSIS — I10 Essential (primary) hypertension: Secondary | ICD-10-CM

## 2023-03-23 DIAGNOSIS — I6522 Occlusion and stenosis of left carotid artery: Secondary | ICD-10-CM | POA: Diagnosis not present

## 2023-03-23 DIAGNOSIS — I6523 Occlusion and stenosis of bilateral carotid arteries: Secondary | ICD-10-CM

## 2023-03-23 DIAGNOSIS — I739 Peripheral vascular disease, unspecified: Secondary | ICD-10-CM | POA: Diagnosis not present

## 2023-03-23 DIAGNOSIS — Z720 Tobacco use: Secondary | ICD-10-CM

## 2023-03-23 NOTE — Assessment & Plan Note (Signed)
History of hyperlipidemia on statin therapy with lipid profile performed 02/24/2022 revealing total cholesterol 142, LDL 75 and HDL 44.

## 2023-03-23 NOTE — Patient Instructions (Signed)
Medication Instructions:  Your physician recommends that you continue on your current medications as directed. Please refer to the Current Medication list given to you today.  *If you need a refill on your cardiac medications before your next appointment, please call your pharmacy*   Testing/Procedures: Your physician has requested that you have a lower extremity arterial duplex. During this test, ultrasound is used to evaluate arterial blood flow in the legs. Allow one hour for this exam. There are no restrictions or special instructions. This will take place at [address] [facility], [address]. **To do in May 2025**  Please note: We ask at that you not bring children with you during ultrasound (echo/ vascular) testing. Due to room size and safety concerns, children are not allowed in the ultrasound rooms during exams. Our front office staff cannot provide observation of children in our lobby area while testing is being conducted. An adult accompanying a patient to their appointment will only be allowed in the ultrasound room at the discretion of the ultrasound technician under special circumstances. We apologize for any inconvenience.  Your physician has requested that you have an ankle brachial index (ABI). During this test an ultrasound and blood pressure cuff are used to evaluate the arteries that supply the arms and legs with blood. Allow thirty minutes for this exam. There are no restrictions or special instructions. This will take place at [address] [facility], [address].  **To do in May 2025**   Please note: We ask at that you not bring children with you during ultrasound (echo/ vascular) testing. Due to room size and safety concerns, children are not allowed in the ultrasound rooms during exams. Our front office staff cannot provide observation of children in our lobby area while testing is being conducted. An adult accompanying a patient to their appointment will only be allowed in the ultrasound  room at the discretion of the ultrasound technician under special circumstances. We apologize for any inconvenience.   Your physician has requested that you have a carotid duplex. This test is an ultrasound of the carotid arteries in your neck. It looks at blood flow through these arteries that supply the brain with blood. Allow one hour for this exam. There are no restrictions or special instructions. This will take place at [address] [facility], [address]. **To do in August 2025**  Please note: We ask at that you not bring children with you during ultrasound (echo/ vascular) testing. Due to room size and safety concerns, children are not allowed in the ultrasound rooms during exams. Our front office staff cannot provide observation of children in our lobby area while testing is being conducted. An adult accompanying a patient to their appointment will only be allowed in the ultrasound room at the discretion of the ultrasound technician under special circumstances. We apologize for any inconvenience.    Follow-Up: At [facility], you and your health needs are our priority.  As part of our continuing mission to provide you with exceptional heart care, we have created designated Provider Care Teams.  These Care Teams include your primary Cardiologist (physician) and Advanced Practice Providers (APPs -  Physician Assistants and Nurse Practitioners) who all work together to provide you with the care you need, when you need it.  We recommend signing up for the patient portal called "MyChart".  Sign up information is provided on this After Visit Summary.  MyChart is used to connect with patients for Virtual Visits (Telemedicine).  Patients are able to view  lab/test results, encounter notes, upcoming appointments, etc.  Non-urgent messages can be sent to your provider as well.   To learn more about what you can do with MyChart, go to [email or web address].    Your next appointment:   6  month(s)  Provider:   Nanetta Batty, MD

## 2023-03-23 NOTE — Assessment & Plan Note (Signed)
Ongoing tobacco abuse of 1 pack/day.  He did stop briefly but 1 back to smoking.  He is been smoking since he was a teenager.

## 2023-03-23 NOTE — Progress Notes (Signed)
03/23/2023 RY KHAM   05-21-1955  [id number]  Primary Physician Hal Hope Marcos Eke, MD Primary Cardiologist: Runell Gess MD Nicholes Calamity, [place]  HPI:  Anthony Brock is a 67 y.o.   married African-American veteran who works at [employer]. He was referred by Dr. Fanny Dance at [facility] for peripheral vascular evaluation because of lifestyle limiting claudication.I last saw him in the office 02/18/2022. He had carotid stenting by myself 07/17/14.[name] He has a long history of tobacco abuse smoking three-quarter pack a day for last 30 years having recently stopped smoking after his last peripheral vascular procedure.. He has never had a heart attack or stroke, and denies chest pain or shortness of breath. He states that he has had left greater than right lower extremity claudication which is lifestyle limiting for several years. Recent Dopplers performed in our office 03/20/14 revealed a right ABI 0.58 with an occluded distal right SFA and a left ABI 0.41 with a high-frequency signal in his left SFA. He underwent angiography and intervention on January 18 revealing a 80% Left  internal carotid artery stenosis, and short segment occlusions in both SFAs. I was able to perform directional atherectomy on his left SFA followed by drug eluding balloon angioplasty. He had an excellent angiographic and clinical result. He ultimately underwent staged right SFA intervention with excellent angiographic and clinical result. He underwent elective left internal carotid artery stenting for high-grade (80%) asymptomatic left internal carotid artery stenosis 07/17/14. His recent carotid Dopplers performed 02/07/15 reveals to be widely patent. He does have recurrent claudication with Dopplers performed 02/07/15 revealing high-grade distal bilateral SFA restenosis. He underwent right SFA directional arthrectomy and drug-eluting balloon angioplasty on 02/15/15 with excellent angiographic and clinical result. He  underwent staged left SFA intervention by myself using directional atherectomy and drug-eluting balloon angioplasty on 02/25/15. Dopplers performed 2 weeks later that showed ABIs of 0.9 bilaterally. Recent lower extremity Dopplers performed 01/22/16 revealed recurrent bilateral SFA stenosis with slightly lower ABIs. He does complain of some mild claudication. Carotid Dopplers performed 01/24/16 revealed a widely patent left internal carotid artery stent. Because of these findings I decided to proceed with PTA and drug eluding stenting using Zilver PTX  of both SFAs. I intervened on the left SFA on December 11. He underwent right SFA intervention 04/20/16. Follow-up lower extremity arterial Doppler studies performed 05/06/16 revealed widely patent stents with normal ABIs. His claudication has resolved. Unfortunately over the last month or 2 his claudication has really heard and his most recent Dopplers performed 10/22/16 revealed a decline in his right ABI 0.94 and his left ABI 0.82 with high-frequency signals in the mid right SFA and proximal left SFA.  He underwent peripheral angiography by myself 12/21/16 revealing high-grade bilateral Zilver in-stent restenosis. I performed drug-eluting balloon angioplasty of his right SFA ISR with excellent angiographic result. ABI improved up to 1.1 and his claudication resolved. He underwent left SFA intervention by myself 01/14/17 with a polypoid atherectomy of the proximal left SFA a along with drug-eluting balloon angioplasty of the left SFA "in-stent restenosis. His follow-up Dopplers performed 01/28/17 showed marked improvement. His claudication has essentially resolved on that side.  He has noticed some increasing dyspnea on exertion however as well as some occasional substernal chest pain. A Myoview stress test and 2-D echo performed 1025 and 02/09/2017 were entirely normal.     Since I saw him in the office 3 1 year ago he is doing well.  He denies chest  pain or  shortness of breath.  He does continue to have left calf claudication with Dopplers performed 02/19/2023 revealed a right ABI of 0.92 and a left of 0.87.  He does have in-stent restenosis left greater than right.  This point, he wishes to wait 6 months to reevaluate.   Current Meds  Medication Sig   aspirin EC 81 MG tablet Take 1 tablet (81 mg total) by mouth daily.   atorvastatin (LIPITOR) 40 MG tablet Take 1 tablet (40 mg total) by mouth every evening.   Blood Glucose Monitoring Suppl (ONE TOUCH ULTRA MINI) w/Device KIT    Brimonidine Tartrate (LUMIFY OP) Apply 1 drop to eye daily as needed (redness).   cetirizine (ZYRTEC) 10 MG tablet Take 10 mg by mouth daily as needed for allergies.    clopidogrel (PLAVIX) 75 MG tablet TAKE 1 TABLET (75 MG TOTAL) BY MOUTH DAILY.   gabapentin (NEURONTIN) 300 MG capsule Take 300 mg by mouth at bedtime.    latanoprost (XALATAN) 0.005 % ophthalmic solution Place 1 drop into both eyes at bedtime.    lisinopril (ZESTRIL) 5 MG tablet TAKE ONE (1) TABLET BY MOUTH EVERY DAY   ONE TOUCH ULTRA TEST test strip    ONETOUCH DELICA LANCETS 33G MISC    VIAGRA 100 MG tablet TAKE 1 TABLET BY MOUTH DAILY AS NEEDED FOR ERECTILE DYSFUNCTION     No Known Allergies  Social History   Socioeconomic History   Marital status: Married    Spouse name: Marylene Land   Number of children: 2   Years of education: 15   Highest education level: Not on file  Occupational History   Occupation: [employer], Materials management  Tobacco Use   Smoking status: Former    Current packs/day: 0.00    Average packs/day: 0.5 packs/day for 45.0 years (22.5 ttl pk-yrs)    Types: Cigarettes    Start date: 02/28/1970    Quit date: 03/01/2015    Years since quitting: 8.0   Smokeless tobacco: Never   Tobacco comments:    02/25/2015 "stopped smoking 02/14/2015" Pt stated he went back to smoking at times  Vaping Use   Vaping status: Never Used  Substance and Sexual Activity   Alcohol use: Yes     Alcohol/week: 0.0 standard drinks of alcohol    Comment: 12/21/2016 "might have a couple drinks/year"   Drug use: Yes    Comment: "might have used some drugs in my 20's"   Sexual activity: Yes  Other Topics Concern   Not on file  Social History Narrative   Lives with wife   caffeine- coffee, 1-2 cups daily   Social Determinants of Health   Financial Resource Strain: Not on file  Food Insecurity: Not on file  Transportation Needs: Not on file  Physical Activity: Not on file  Stress: Not on file  Social Connections: Not on file  Intimate Partner Violence: Not on file     Review of Systems: General: negative for chills, fever, night sweats or weight changes.  Cardiovascular: negative for chest pain, dyspnea on exertion, edema, orthopnea, palpitations, paroxysmal nocturnal dyspnea or shortness of breath Dermatological: negative for rash Respiratory: negative for cough or wheezing Urologic: negative for hematuria Abdominal: negative for nausea, vomiting, diarrhea, bright red blood per rectum, melena, or hematemesis Neurologic: negative for visual changes, syncope, or dizziness All other systems reviewed and are otherwise negative except as noted above.    Blood pressure (!) 142/62, pulse 72, height 6\' 1"  (1.854 m), weight  170 lb (77.1 kg), SpO2 91%.  General appearance: alert and no distress Neck: no adenopathy, no carotid bruit, no JVD, supple, symmetrical, trachea midline, and thyroid not enlarged, symmetric, no tenderness/mass/nodules Lungs: clear to auscultation bilaterally Heart: regular rate and rhythm, S1, S2 normal, no murmur, click, rub or gallop Extremities: extremities normal, atraumatic, no cyanosis or edema Pulses: 2+ and symmetric Skin: Skin color, texture, turgor normal. No rashes or lesions Neurologic: Grossly normal  EKG not performed today      ASSESSMENT AND PLAN:   Essential hypertension History of essential hypertension with blood pressure measured  today at 142/62.  He is on lisinopril and diltiazem.  Lt ICA stenosis- Carotid stent placed 07/17/14 History of carotid artery disease status post left internal carotid stenting by myself/ 07/17/14.  Follow-up Dopplers performed 11/20/2022 revealed moderate right ICA stenosis with a patent left carotid stent.  Will repeat this in 1 year  PAD (peripheral artery disease) (HCC) History of PAD status post multiple bilateral SFA interventions by myself dating back to 2015.  He has had directional atherectomy, drug eluting stents with intervention for "in-stent restenosis.  He does complain of left calf claudication which she had a year ago with Dopplers that suggested "in-stent restenosis.  His most recent Doppler studies performed 02/19/2023 revealed a right ABI of 0.92 and a left of 0.87.  He did have in-stent restenosis bilaterally left greater than right.  He is not at the point where he feels he needs percutaneous intervention.  We will recheck Dopplers in 6 months and I will see him back at that time to discuss the possibility of reintervening.  Hyperlipidemia with target LDL less than 70 History of hyperlipidemia on statin therapy with lipid profile performed 02/24/2022 revealing total cholesterol 142, LDL 75 and HDL 44.  Tobacco abuse Ongoing tobacco abuse of 1 pack/day.  He did stop briefly but 1 back to smoking.  He is been smoking since he was a teenager.     Runell Gess MD FACP,FACC,FAHA, [facility] 03/23/2023 12:29 PM

## 2023-03-23 NOTE — Assessment & Plan Note (Signed)
History of essential hypertension with blood pressure measured today at 142/62.  He is on lisinopril and diltiazem.

## 2023-03-23 NOTE — Assessment & Plan Note (Signed)
History of PAD status post multiple bilateral SFA interventions by myself dating back to 2015.  He has had directional atherectomy, drug eluting stents with intervention for "in-stent restenosis.  He does complain of left calf claudication which she had a year ago with Dopplers that suggested "in-stent restenosis.  His most recent Doppler studies performed 02/19/2023 revealed a right ABI of 0.92 and a left of 0.87.  He did have in-stent restenosis bilaterally left greater than right.  He is not at the point where he feels he needs percutaneous intervention.  We will recheck Dopplers in 6 months and I will see him back at that time to discuss the possibility of reintervening.

## 2023-03-23 NOTE — Assessment & Plan Note (Signed)
History of carotid artery disease status post left internal carotid stenting by myself/ 07/17/14.  Follow-up Dopplers performed 11/20/2022 revealed moderate right ICA stenosis with a patent left carotid stent.  Will repeat this in 1 year

## 2023-09-02 ENCOUNTER — Encounter: Payer: Self-pay | Admitting: Cardiovascular Disease

## 2023-09-08 ENCOUNTER — Ambulatory Visit (HOSPITAL_COMMUNITY)
Admission: RE | Admit: 2023-09-08 | Discharge: 2023-09-08 | Disposition: A | Discharge status: Home / Self Care | Payer: PPO | Source: Ambulatory Visit | Attending: Cardiovascular Disease | Admitting: Cardiovascular Disease

## 2023-09-08 ENCOUNTER — Ambulatory Visit (HOSPITAL_BASED_OUTPATIENT_CLINIC_OR_DEPARTMENT_OTHER)
Admission: RE | Admit: 2023-09-08 | Discharge: 2023-09-08 | Disposition: A | Discharge status: Home / Self Care | Payer: PPO | Source: Ambulatory Visit | Attending: Cardiovascular Disease | Admitting: Cardiovascular Disease

## 2023-09-08 ENCOUNTER — Ambulatory Visit: Payer: Self-pay | Admitting: Cardiology

## 2023-09-08 DIAGNOSIS — I6522 Occlusion and stenosis of left carotid artery: Secondary | ICD-10-CM

## 2023-09-08 DIAGNOSIS — I10 Essential (primary) hypertension: Secondary | ICD-10-CM

## 2023-09-08 DIAGNOSIS — I6523 Occlusion and stenosis of bilateral carotid arteries: Secondary | ICD-10-CM | POA: Diagnosis not present

## 2023-09-08 DIAGNOSIS — I739 Peripheral vascular disease, unspecified: Secondary | ICD-10-CM

## 2023-09-08 LAB — VAS US ABI WITH/WO TBI
Left ABI: 0.93
Right ABI: 1.01

## 2023-09-08 NOTE — Progress Notes (Signed)
 Lower extremity arterial duplex with ABI 09/08/2023: Right SFA stent 50 to 99% stenosis Left SFA 50 to 74% stenosis in the ostium and 50 to 99% stenosis in the midsegment. Bilateral ABI revealed normal on the right and mildly reduced on the left. Compared to 02/19/2023, no change.  Overall stable vascular Dopplers, unless having symptoms of claudication, continue medical therapy and in 1 year.

## 2023-09-08 NOTE — Progress Notes (Signed)
 Carotid artery duplex 08/31/2023: Right ICA 50 to 49% stenosis. Left ICA stent is widely patent without evidence of stenosis. Bilateral antegrade vertebral artery flow. Right subclavian artery flow is disturbed, normal hemodynamics seen in the left subclavian artery. No change from 11/20/2022.  Follow-up in a year.  Patient has other tests, also forwarding them to you.
